# Patient Record
Sex: Male | Born: 1965 | Race: White | Hispanic: No | Smoking: Former smoker
Health system: Southern US, Community
[De-identification: ages and names within clinical notes are randomized; demographics above are authoritative.]

## PROBLEM LIST (undated history)

## (undated) DIAGNOSIS — G8929 Other chronic pain: Secondary | ICD-10-CM

## (undated) DIAGNOSIS — Z85528 Personal history of other malignant neoplasm of kidney: Secondary | ICD-10-CM

## (undated) DIAGNOSIS — K219 Gastro-esophageal reflux disease without esophagitis: Secondary | ICD-10-CM

## (undated) DIAGNOSIS — N189 Chronic kidney disease, unspecified: Secondary | ICD-10-CM

## (undated) DIAGNOSIS — Z95810 Presence of automatic (implantable) cardiac defibrillator: Secondary | ICD-10-CM

## (undated) DIAGNOSIS — M1990 Unspecified osteoarthritis, unspecified site: Secondary | ICD-10-CM

## (undated) DIAGNOSIS — I219 Acute myocardial infarction, unspecified: Secondary | ICD-10-CM

## (undated) DIAGNOSIS — G894 Chronic pain syndrome: Secondary | ICD-10-CM

## (undated) DIAGNOSIS — M545 Low back pain, unspecified: Secondary | ICD-10-CM

## (undated) DIAGNOSIS — F32A Depression, unspecified: Secondary | ICD-10-CM

## (undated) DIAGNOSIS — I959 Hypotension, unspecified: Secondary | ICD-10-CM

## (undated) DIAGNOSIS — I5022 Chronic systolic (congestive) heart failure: Secondary | ICD-10-CM

## (undated) DIAGNOSIS — I509 Heart failure, unspecified: Secondary | ICD-10-CM

## (undated) DIAGNOSIS — F419 Anxiety disorder, unspecified: Secondary | ICD-10-CM

## (undated) DIAGNOSIS — E119 Type 2 diabetes mellitus without complications: Secondary | ICD-10-CM

## (undated) DIAGNOSIS — I429 Cardiomyopathy, unspecified: Secondary | ICD-10-CM

## (undated) DIAGNOSIS — E1140 Type 2 diabetes mellitus with diabetic neuropathy, unspecified: Secondary | ICD-10-CM

## (undated) DIAGNOSIS — I10 Essential (primary) hypertension: Secondary | ICD-10-CM

## (undated) HISTORY — PX: BACK SURGERY: SHX140

## (undated) HISTORY — PX: WISDOM TOOTH EXTRACTION: SHX21

## (undated) HISTORY — PX: CARDIAC DEFIBRILLATOR PLACEMENT: SHX171

## (undated) HISTORY — DX: Heart failure, unspecified: I50.9

## (undated) HISTORY — DX: Chronic pain syndrome: G89.4

## (undated) HISTORY — DX: Hypotension, unspecified: I95.9

## (undated) HISTORY — DX: Type 2 diabetes mellitus with diabetic neuropathy, unspecified: E11.40

## (undated) HISTORY — DX: Low back pain, unspecified: M54.50

## (undated) HISTORY — DX: Depression, unspecified: F32.A

## (undated) HISTORY — DX: Presence of automatic (implantable) cardiac defibrillator: Z95.810

## (undated) HISTORY — DX: Chronic systolic (congestive) heart failure: I50.22

## (undated) HISTORY — DX: Anxiety disorder, unspecified: F41.9

## (undated) HISTORY — PX: KIDNEY CYST REMOVAL: SHX684

## (undated) HISTORY — DX: Chronic kidney disease, unspecified: N18.9

## (undated) HISTORY — DX: Type 2 diabetes mellitus without complications: E11.9

## (undated) HISTORY — DX: Essential (primary) hypertension: I10

## (undated) HISTORY — DX: Personal history of other malignant neoplasm of kidney: Z85.528

## (undated) HISTORY — DX: Cardiomyopathy, unspecified: I42.9

## (undated) HISTORY — DX: Other chronic pain: G89.29

---

## 2005-10-22 ENCOUNTER — Encounter: Admission: RE | Admit: 2005-10-22 | Discharge: 2005-10-22 | Payer: Self-pay | Admitting: Neurosurgery

## 2005-10-22 IMAGING — RF DG MYELOGRAM LUMBAR
14 of 17 series · 14 of 17 positions shown · IV contrast (omnipaque)
Comparison: none

CLINICAL DATA: Degenerative disc disease with low back pain and leg pain.  
 LUMBAR MYELOGRAM:
 Following informed consent, sterile preparation of the back, and adequate local anesthesia, a lumbar puncture was performed using a 22 gauge needle at L3-4 from a right midline approach.  Fluid was clear and colorless.  10cc of Omnipaque 180 was instilled in the subarachnoid space.  
 There is no abnormality at L3-4 or above. 
 At L4-5, there is a moderate anterior extradural defect.  There is narrowing of the lateral recesses that could affect either L5 nerve root.  
 At L5-S1, there is an anterior extradural defect but no evidence of S1 nerve root compression.
TECHNIQUE: Multidetector CT imaging of the lumbar spine was performed after intrathecal injection of contrast.  Multiplanar CT image reconstructions were also generated.

[Series 1: (hospital) · 1 of 1 slices shown]
[im 1/1]
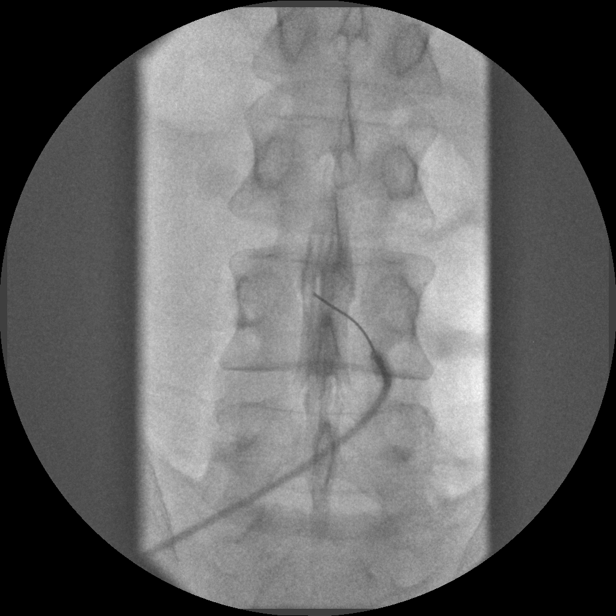

[Series 2: myelogram  white · 1 of 1 slices shown (1 of 13)]
[im 1/1]
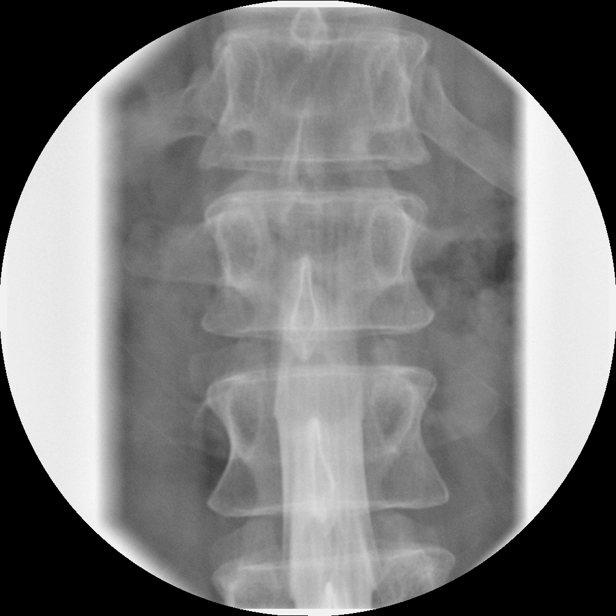

[Series 4: myelogram  white · 1 of 1 slices shown (2 of 13)]
[im 1/1]
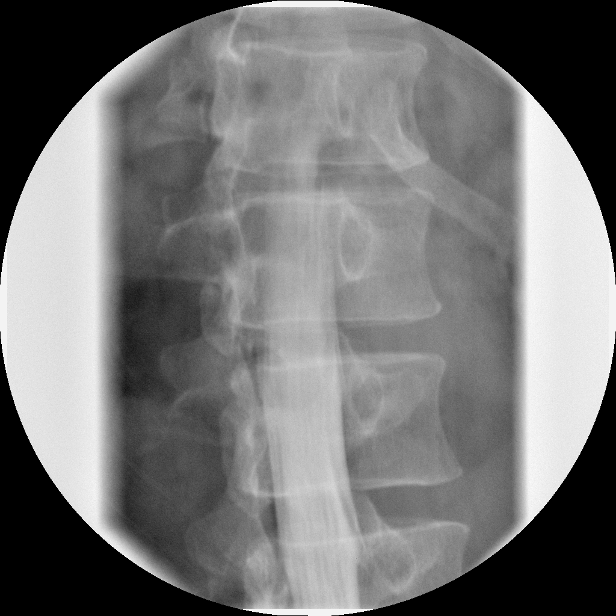

[Series 5: myelogram  white · 1 of 1 slices shown (3 of 13)]
[im 1/1]
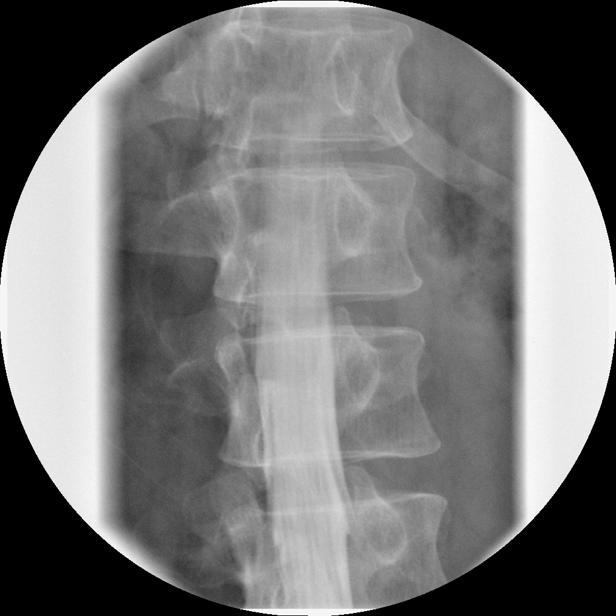

[Series 6: myelogram  white · 1 of 1 slices shown (4 of 13)]
[im 1/1]
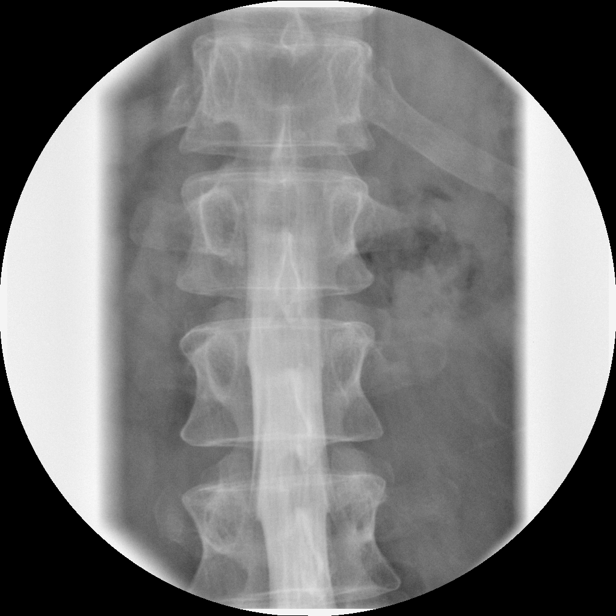

[Series 7: myelogram  white · 1 of 1 slices shown (5 of 13)]
[im 1/1]
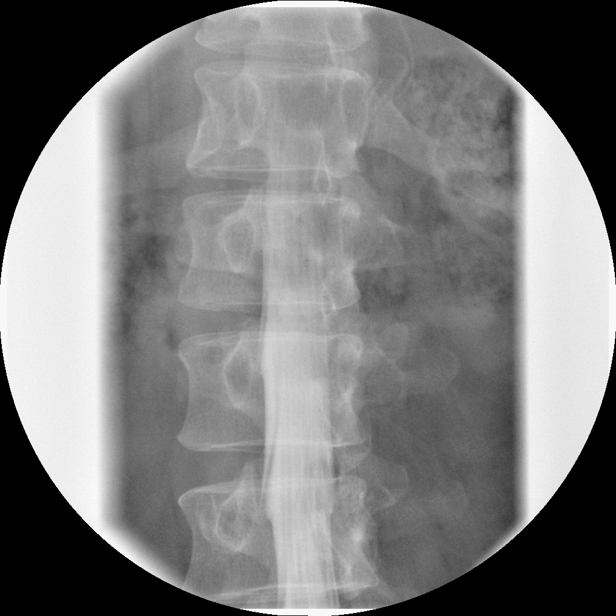

[Series 8: myelogram  white · 1 of 1 slices shown (6 of 13)]
[im 1/1]
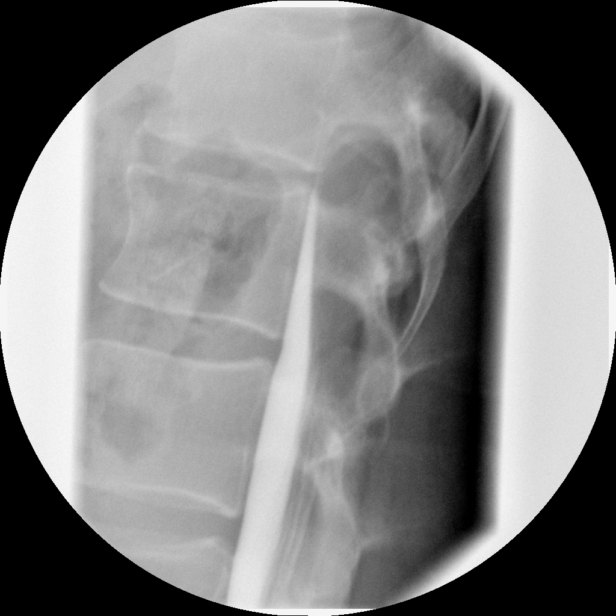

[Series 10: myelogram  white · 1 of 1 slices shown (7 of 13)]
[im 1/1]
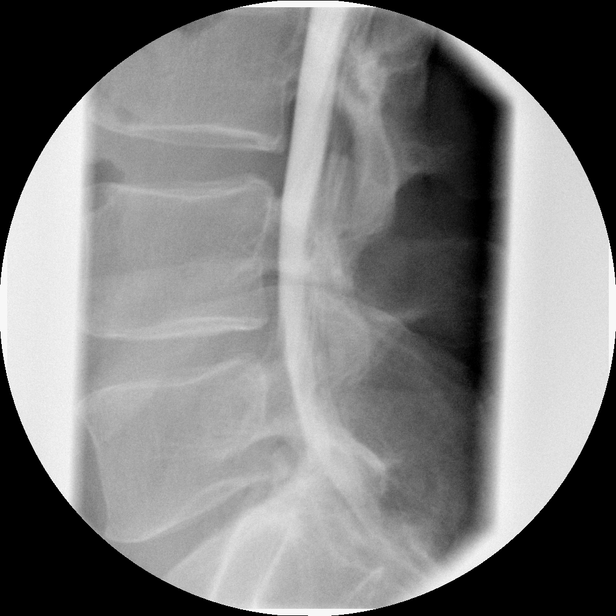

[Series 11: myelogram  white · 1 of 1 slices shown (8 of 13)]
[im 1/1]
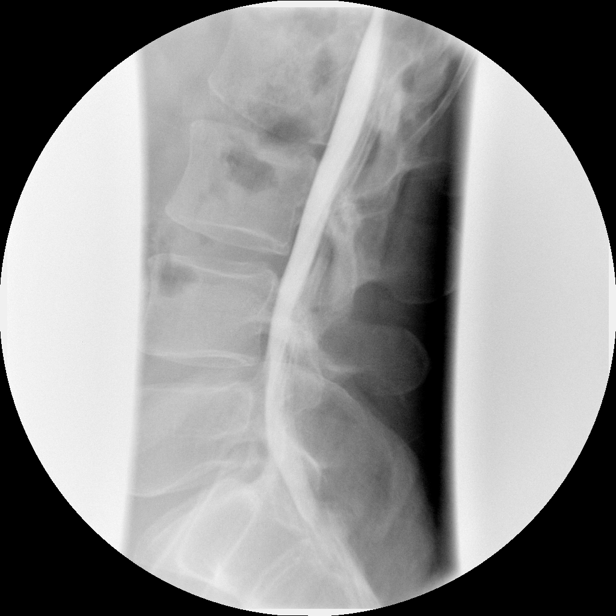

[Series 12: myelogram  white · 1 of 1 slices shown (9 of 13)]
[im 1/1]
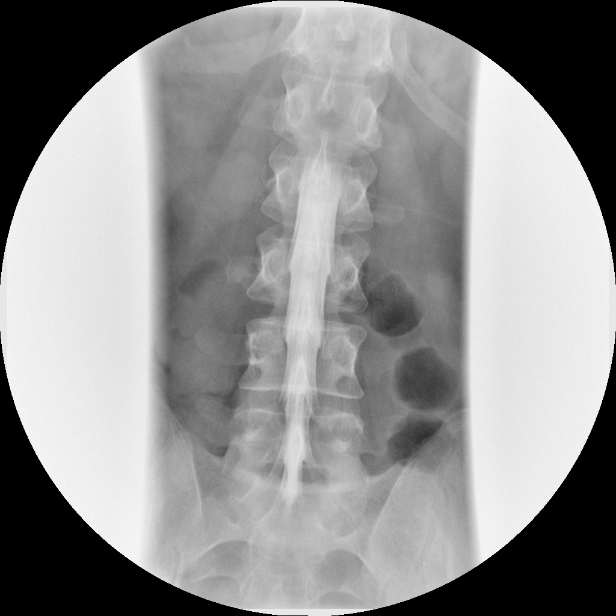

[Series 13: myelogram  white · 1 of 1 slices shown (10 of 13)]
[im 1/1]
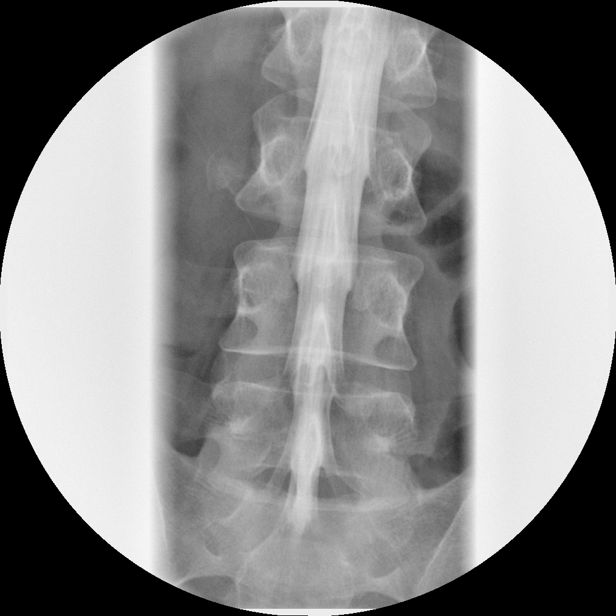

[Series 14: myelogram  white · 1 of 1 slices shown (11 of 13)]
[im 1/1]
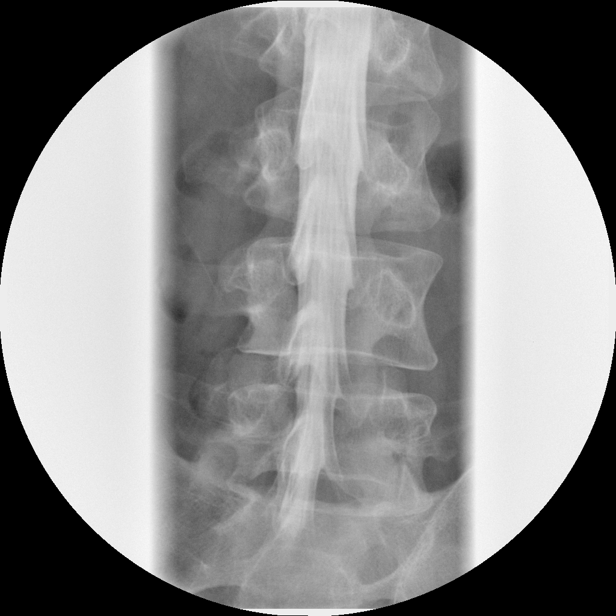

[Series 16: myelogram  white · 1 of 1 slices shown (12 of 13)]
[im 1/1]
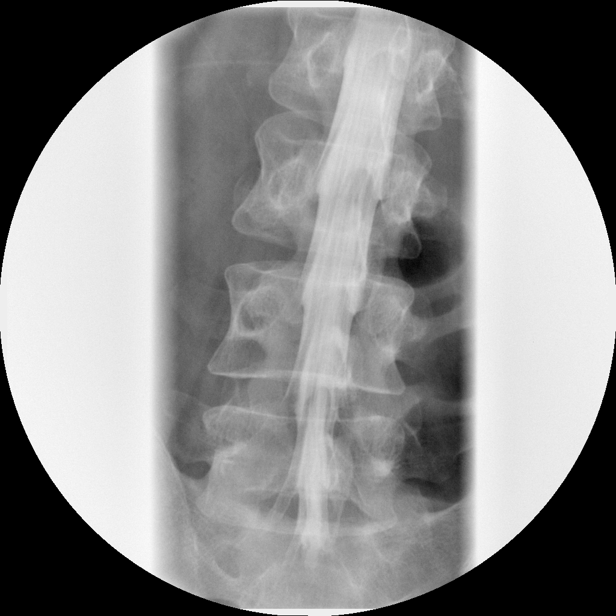

[Series 17: myelogram  white · 1 of 1 slices shown (13 of 13)]
[im 1/1]
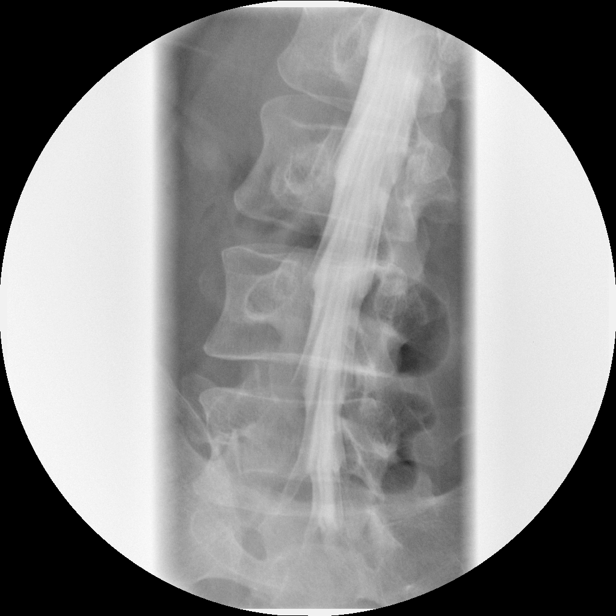

[14 of 17 positions shown; findings below may reference images not displayed]

IMPRESSION: Anterior extradural defects at L4-5 and L5-S1 with apparent L5 nerve root compression in both lateral recesses. 
 CT LUMBAR SPINE W/CONTRAST (POST-MYELOGRAM):
FINDINGS: L1-2 and L2-3, normal. 
 L3-4, minimal disc bulge but no herniation or stenosis. 
 L4-5, circumferential protrusion of disc material.  Mild ligamentous hypertrophy.  Stenosis at both lateral recesses that could compress either L5 nerve root. 
 L5-S1, shallow broad based disc protrusion that approaches the S1 nerve roots but does not compress or displace them. 
 No osseous or articular pathology.
IMPRESSION: 1.  Broad based disc herniation at L4-5.  Ligamentous hypertrophy.  Stenosis at both lateral recesses that could well compress either or both L5 nerve roots. 
 2.  Central disc protrusion at L5-S1 that approaches the S1 nerve roots but does not grossly compress or displace them.

## 2005-10-22 IMAGING — CT CT L SPINE W/ CM
3 of 8 series · 15 of 33 positions shown, 18 images · IV contrast (omnipaque)
Comparison: none

CLINICAL DATA: Degenerative disc disease with low back pain and leg pain.  
 LUMBAR MYELOGRAM:
 Following informed consent, sterile preparation of the back, and adequate local anesthesia, a lumbar puncture was performed using a 22 gauge needle at L3-4 from a right midline approach.  Fluid was clear and colorless.  10cc of Omnipaque 180 was instilled in the subarachnoid space.  
 There is no abnormality at L3-4 or above. 
 At L4-5, there is a moderate anterior extradural defect.  There is narrowing of the lateral recesses that could affect either L5 nerve root.  
 At L5-S1, there is an anterior extradural defect but no evidence of S1 nerve root compression.
TECHNIQUE: Multidetector CT imaging of the lumbar spine was performed after intrathecal injection of contrast.  Multiplanar CT image reconstructions were also generated.

[Series 4: recon 3: l-spine helical · axial · 0.27mm/px · z∈[-66,+83]mm · 7 of 159 slices shown, 9 images]
[im 20/159  soft-tissue]
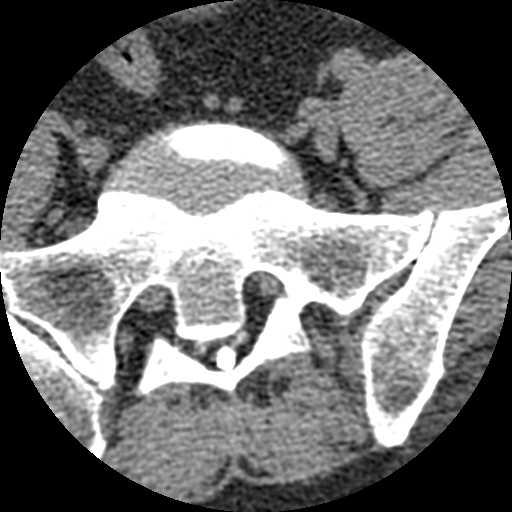
[im 20/159  bone]
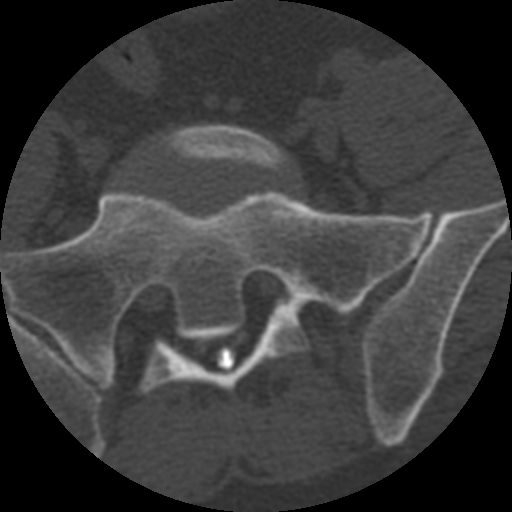
[im 40/159  bone]
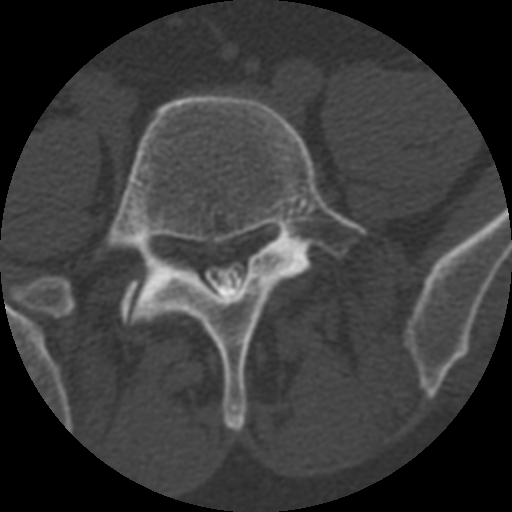
[im 60/159  bone]
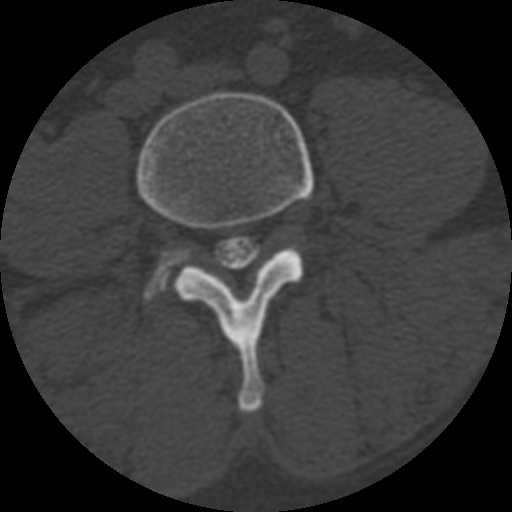
[im 80/159  bone]
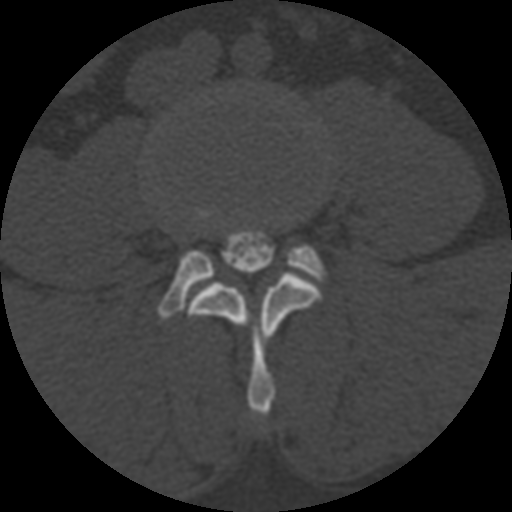
[im 99/159  soft-tissue]
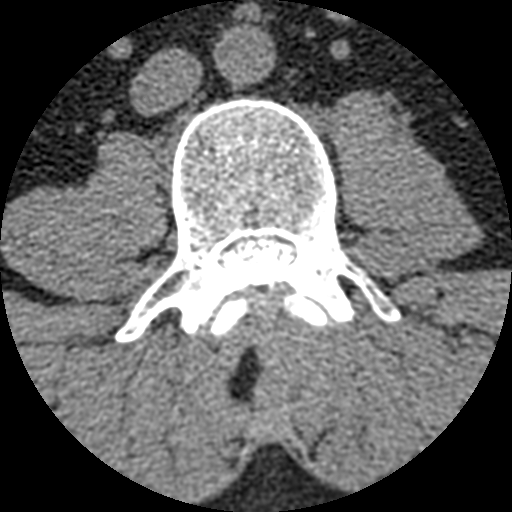
[im 99/159  bone]
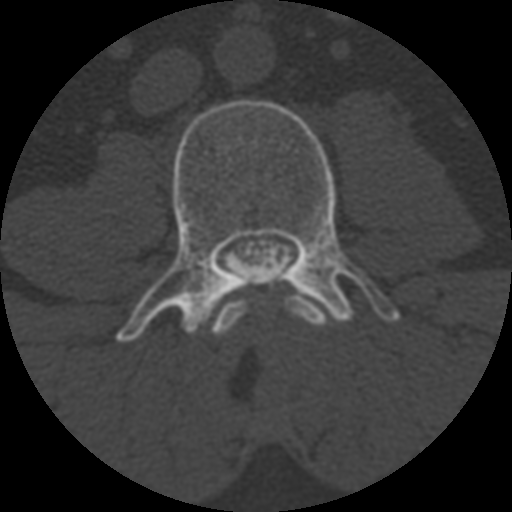
[im 119/159  bone]
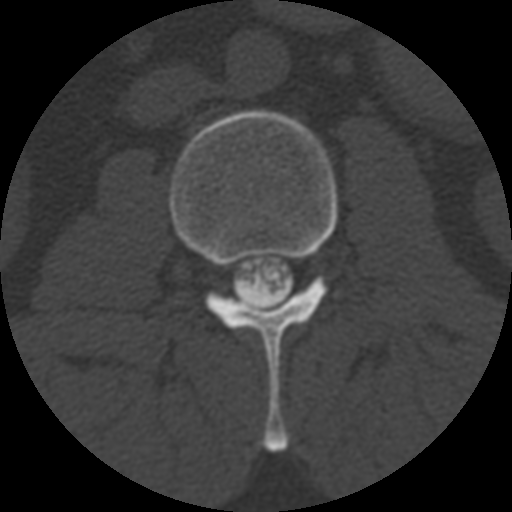
[im 139/159  bone]
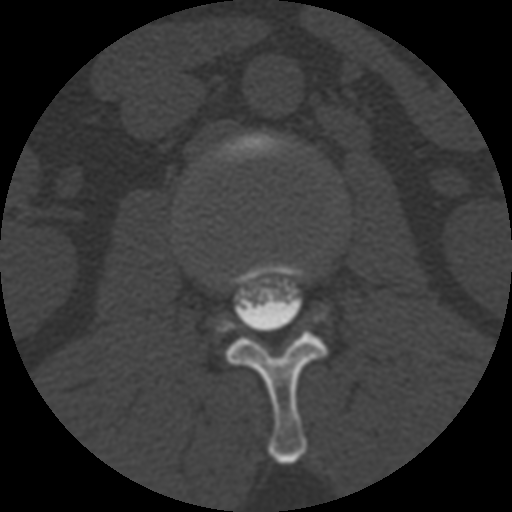

[Series 400: reformatted · sagittal · 0.40mm/px · 5 of 40 slices shown, 6 images (1 of 2)]
[im 14/40  bone]
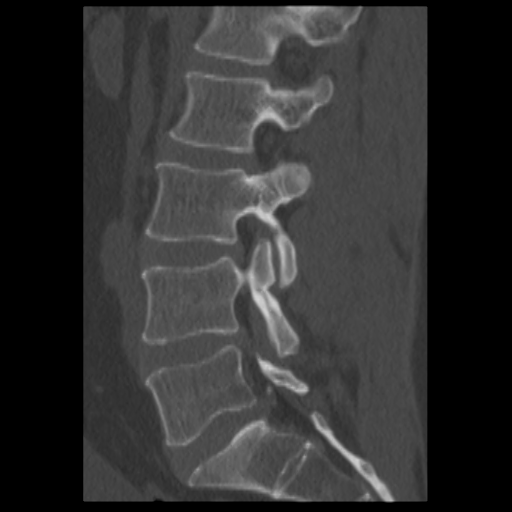
[im 17/40  bone]
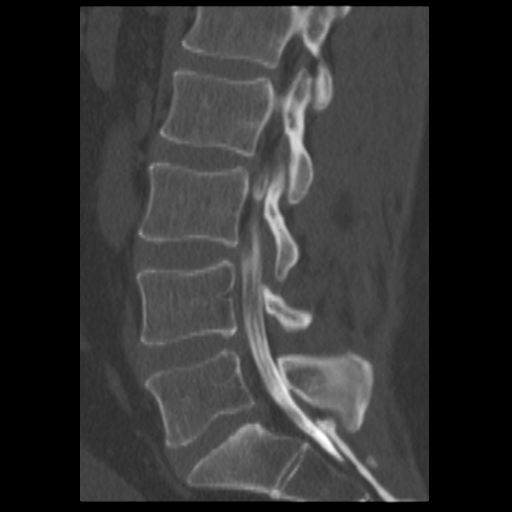
[im 20/40  soft-tissue]
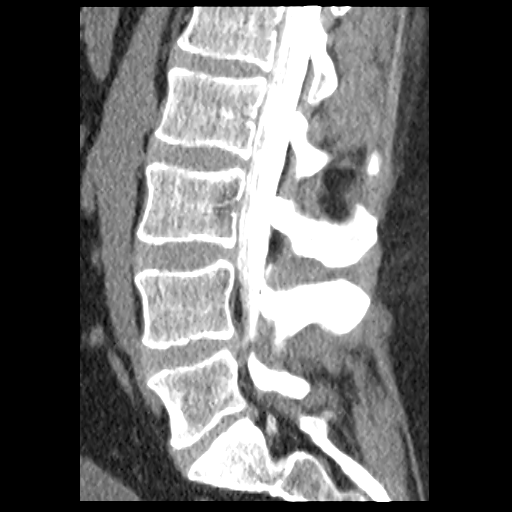
[im 20/40  bone]
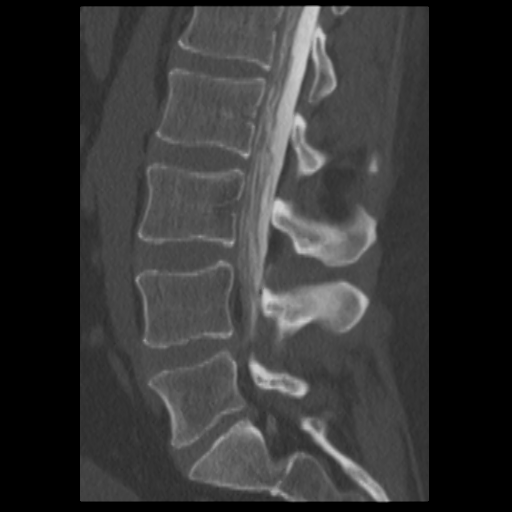
[im 23/40  bone]
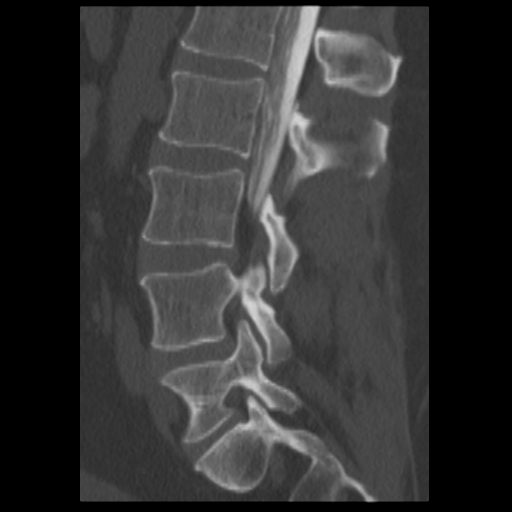
[im 27/40  bone]
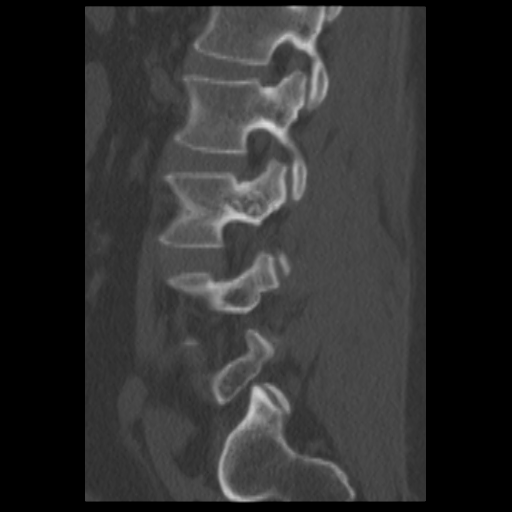

[Series 401: reformatted · coronal · 0.40mm/px · 3 of 40 slices shown (2 of 2)]
[im 8/40  bone]
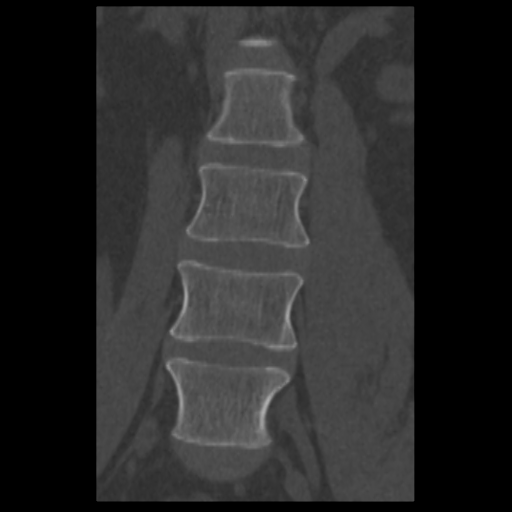
[im 16/40  bone]
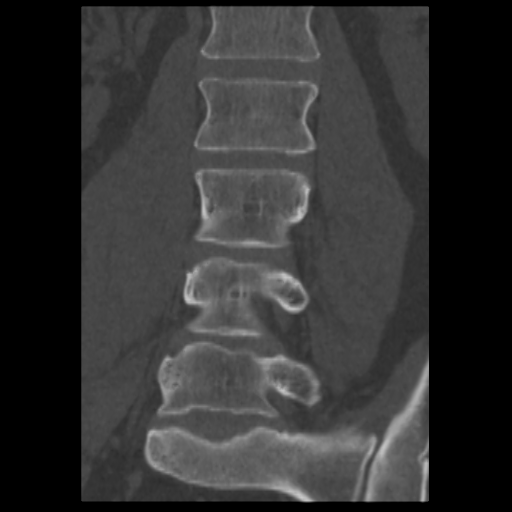
[im 24/40  bone]
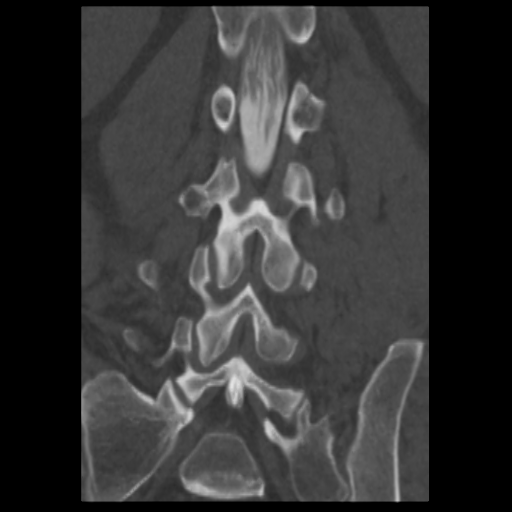

[15 of 33 positions shown; findings below may reference images not displayed]

IMPRESSION: Anterior extradural defects at L4-5 and L5-S1 with apparent L5 nerve root compression in both lateral recesses. 
 CT LUMBAR SPINE W/CONTRAST (POST-MYELOGRAM):
FINDINGS: L1-2 and L2-3, normal. 
 L3-4, minimal disc bulge but no herniation or stenosis. 
 L4-5, circumferential protrusion of disc material.  Mild ligamentous hypertrophy.  Stenosis at both lateral recesses that could compress either L5 nerve root. 
 L5-S1, shallow broad based disc protrusion that approaches the S1 nerve roots but does not compress or displace them. 
 No osseous or articular pathology.
IMPRESSION: 1.  Broad based disc herniation at L4-5.  Ligamentous hypertrophy.  Stenosis at both lateral recesses that could well compress either or both L5 nerve roots. 
 2.  Central disc protrusion at L5-S1 that approaches the S1 nerve roots but does not grossly compress or displace them.

## 2005-12-09 IMAGING — CR DG CHEST 2V
2 series · 2 of 2 positions shown · non-contrast
Comparison: none

CLINICAL DATA: HNP.  Diabetes.  Hypertension.  Preoperative chest. 
 CHEST ? 2 VIEW ? 12/09/05:

[view not recorded (1 of 2)]
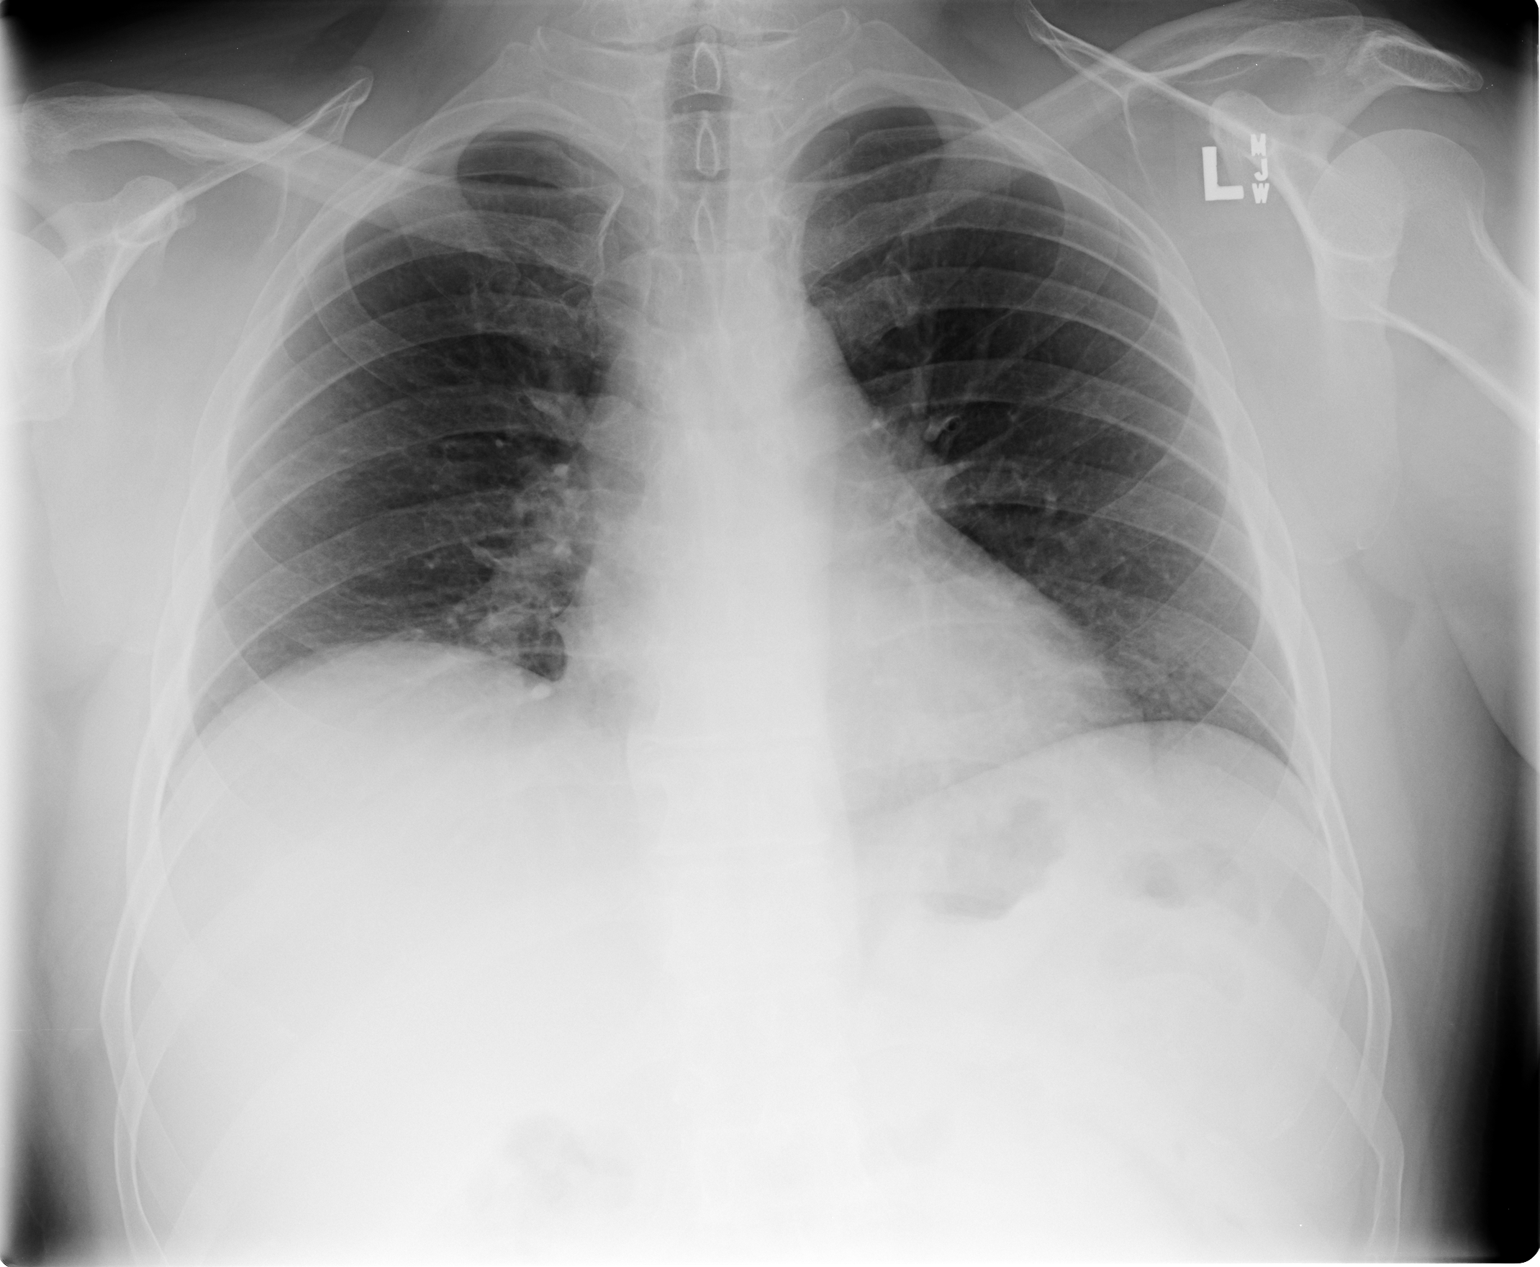

[view not recorded (2 of 2)]
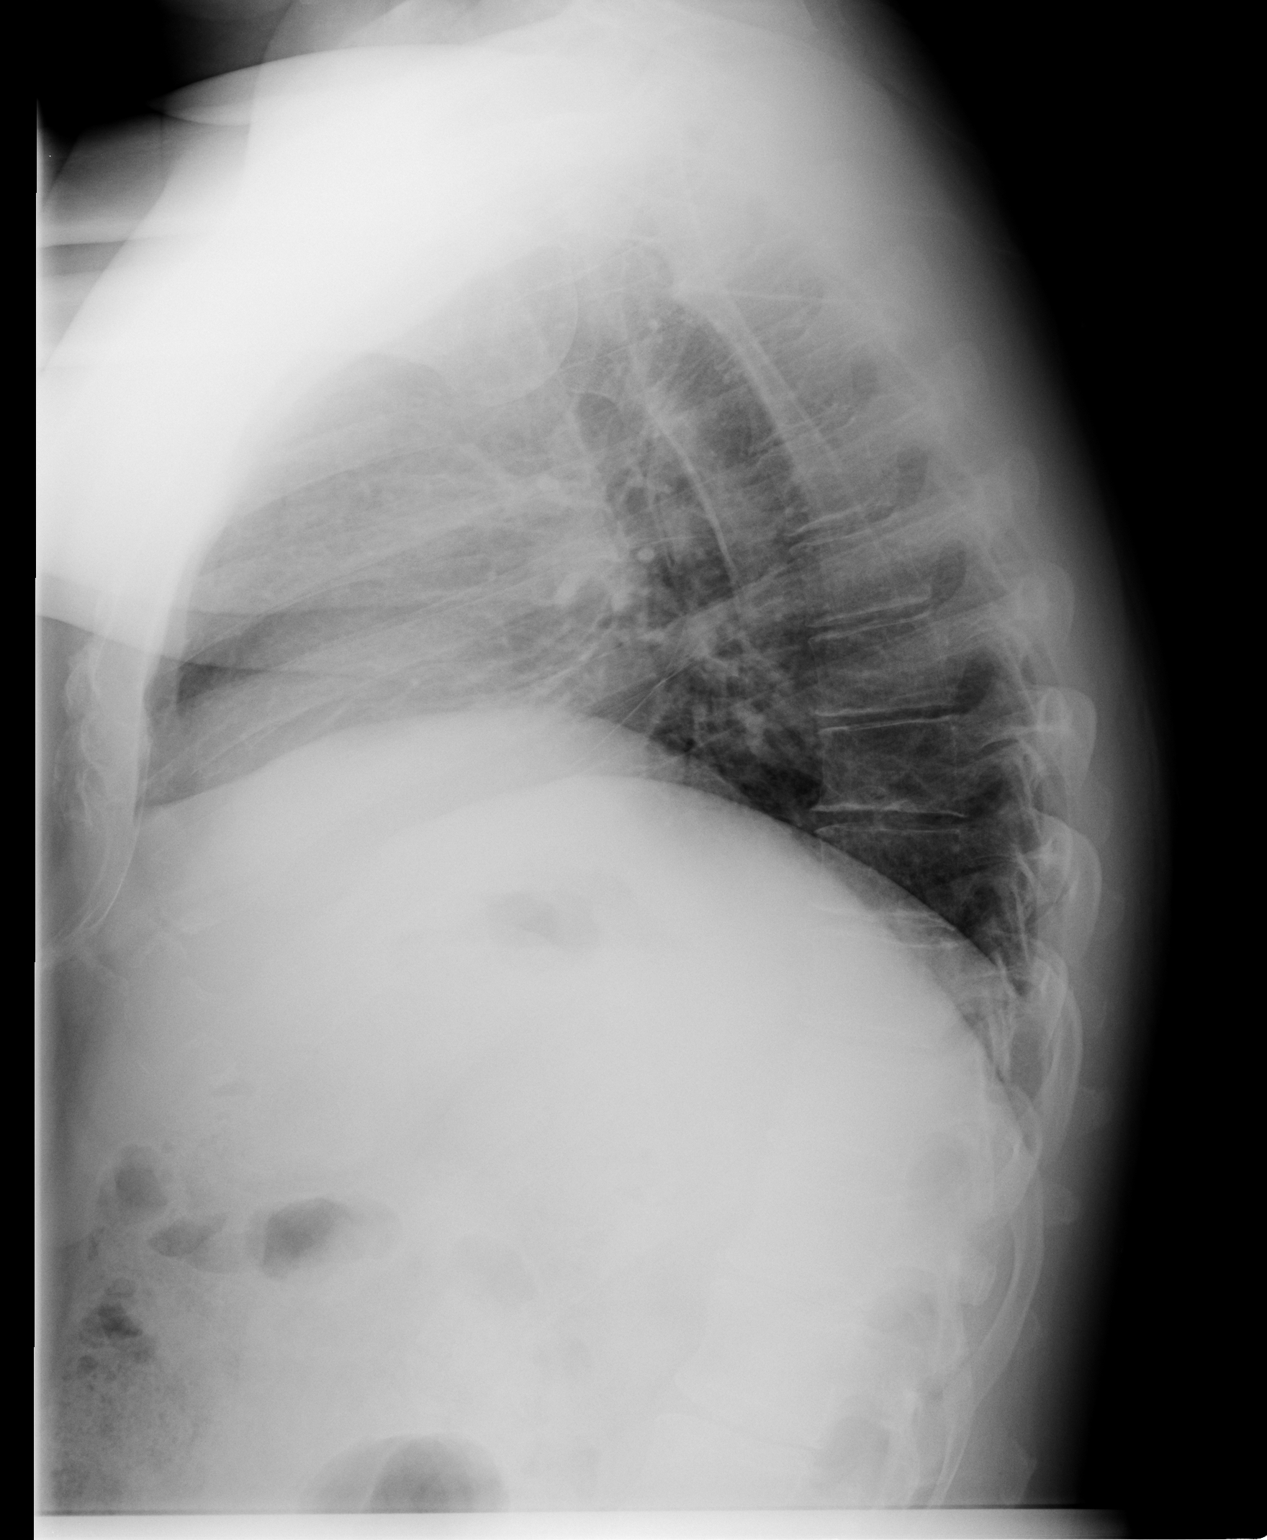

[2 of 2 positions shown; findings below may reference images not displayed]

FINDINGS: The lungs are clear, and the heart and mediastinal structures are normal.
IMPRESSION: No evidence for active chest disease.

## 2005-12-15 ENCOUNTER — Inpatient Hospital Stay (HOSPITAL_COMMUNITY): Admission: RE | Admit: 2005-12-15 | Discharge: 2005-12-17 | Payer: Self-pay | Admitting: Neurosurgery

## 2005-12-15 IMAGING — RF DG LUMBAR SPINE 2-3V
1 series · 2 of 2 positions shown · non-contrast
Comparison: none

CLINICAL DATA: Lumbar laminectomy and fusion for spinal stenosis and disk herniation.
 LUMBAR SPINE ? 2 VIEW:

[Series 1: run · 2 of 2 slices shown]
[im 1/2]
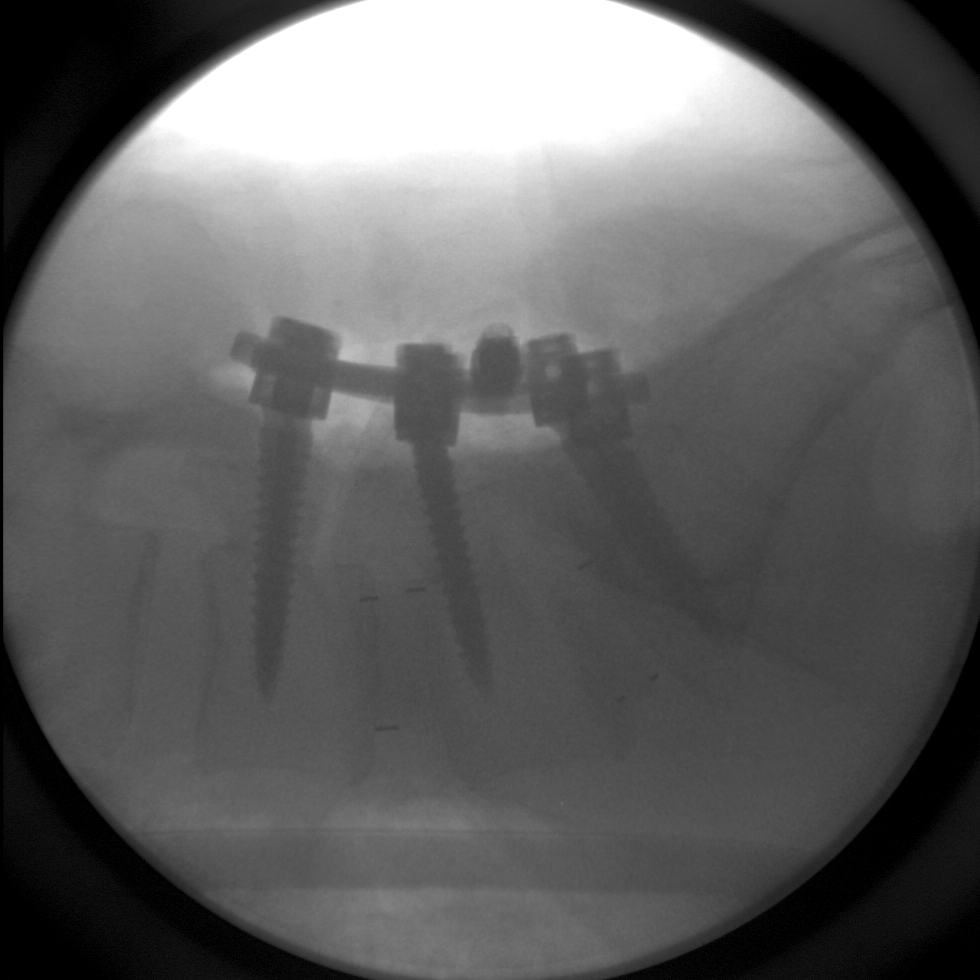
[im 2/2]
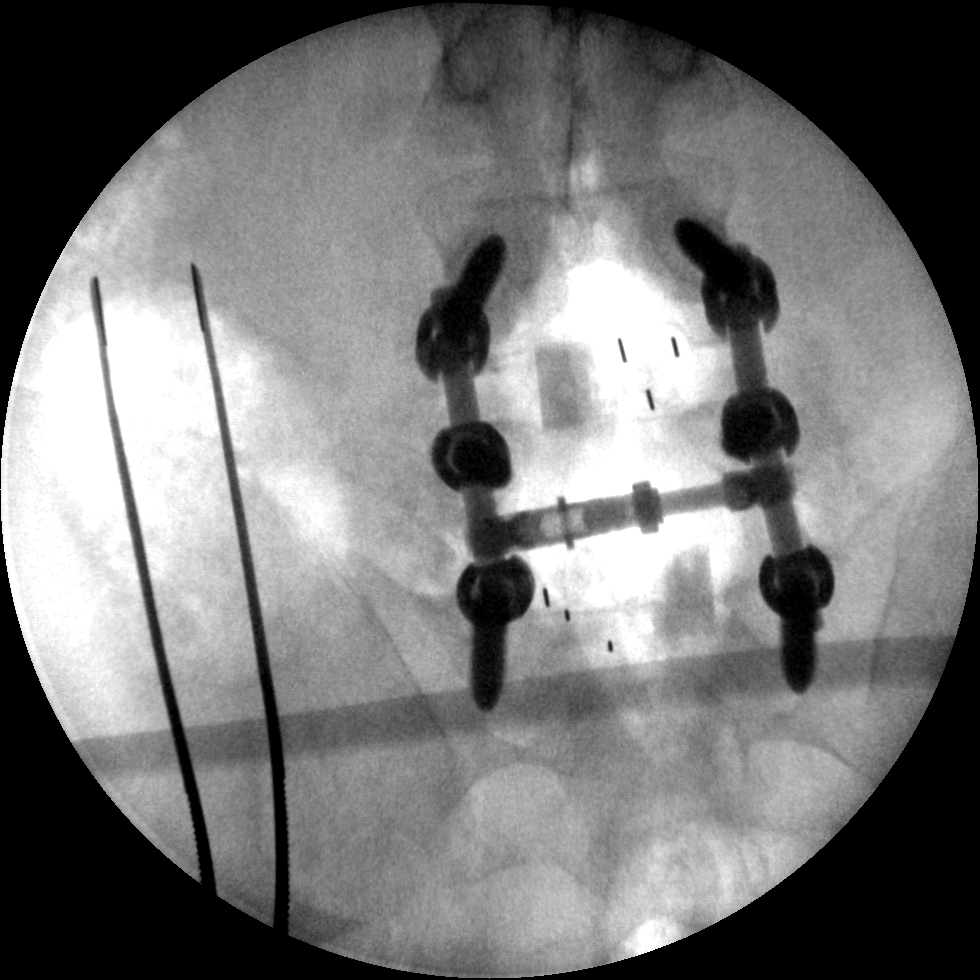

[2 of 2 positions shown; findings below may reference images not displayed]

FINDINGS: Frontal and lateral projections were obtained intraoperatively with the C-arm.  Posterior interpedicular screws are present at L-4, L-5 and S-1 with intervening bony allograft material.  Alignment appears near anatomic.
IMPRESSION: Posterior lumbar fusion spanning from L-4 to S-1.

## 2006-04-14 ENCOUNTER — Encounter: Admission: RE | Admit: 2006-04-14 | Discharge: 2006-04-14 | Payer: Self-pay | Admitting: Neurosurgery

## 2006-04-14 IMAGING — CR DG MYELOGRAM LUMBAR
3 series · 3 of 3 positions shown · IV contrast (omnipaque)
Comparison: none

CLINICAL DATA: Low back pain radiating to both lower extremities.  Recent L4-5 and S1 fusion. 
 LUMBAR MYELOGRAM:
TECHNIQUE: The low back was prepped and draped in a sterile fashion.  Lidocaine was utilized for local anesthesia.  Under fluoroscopic guidance, a 22 gauge spinal needle was inserted into the CSF space at L3-4 via left paramedian approach.  20cc Omnipaque 300 was administered.  No complications were encountered.
TECHNIQUE: Multidetector CT imaging of the lumbar spine was performed after intrathecal injection of contrast.  Multiplanar CT image reconstructions were also generated.

[view not recorded (1 of 3)]
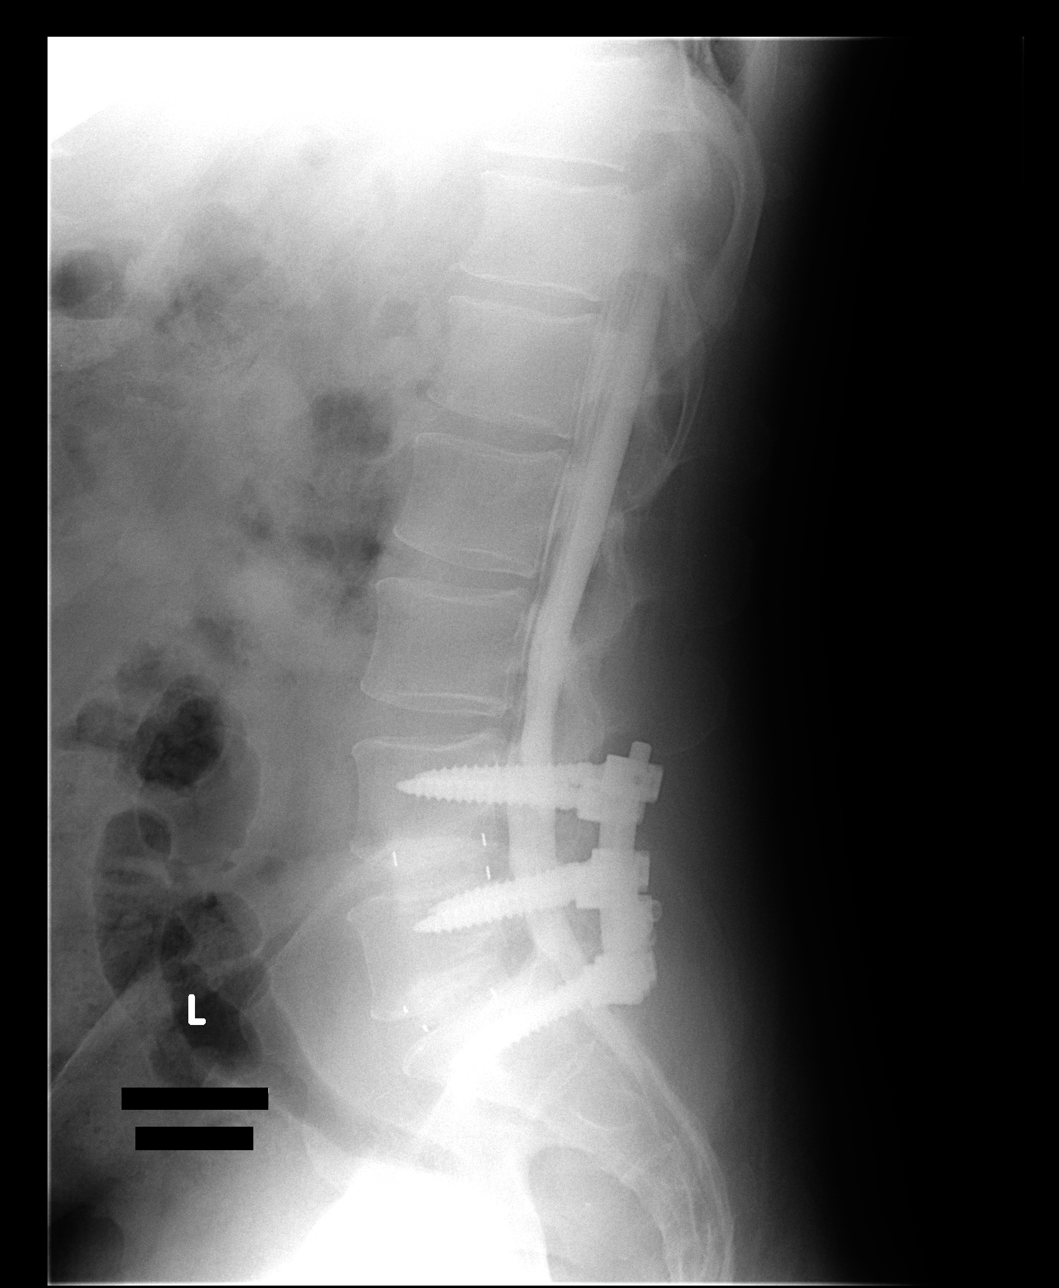

[view not recorded (2 of 3)]
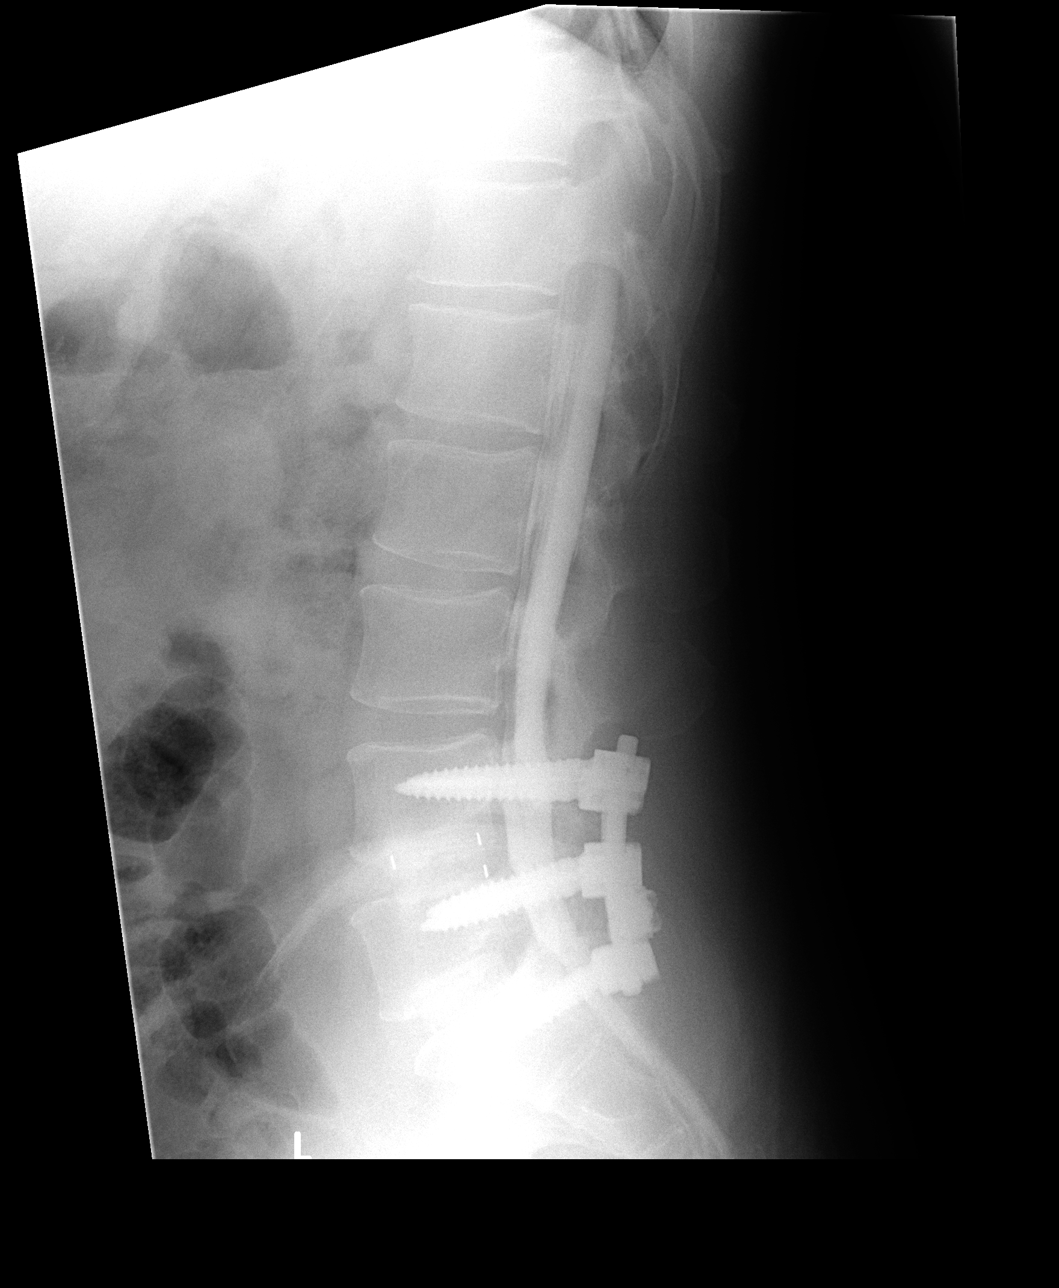

[view not recorded (3 of 3)]
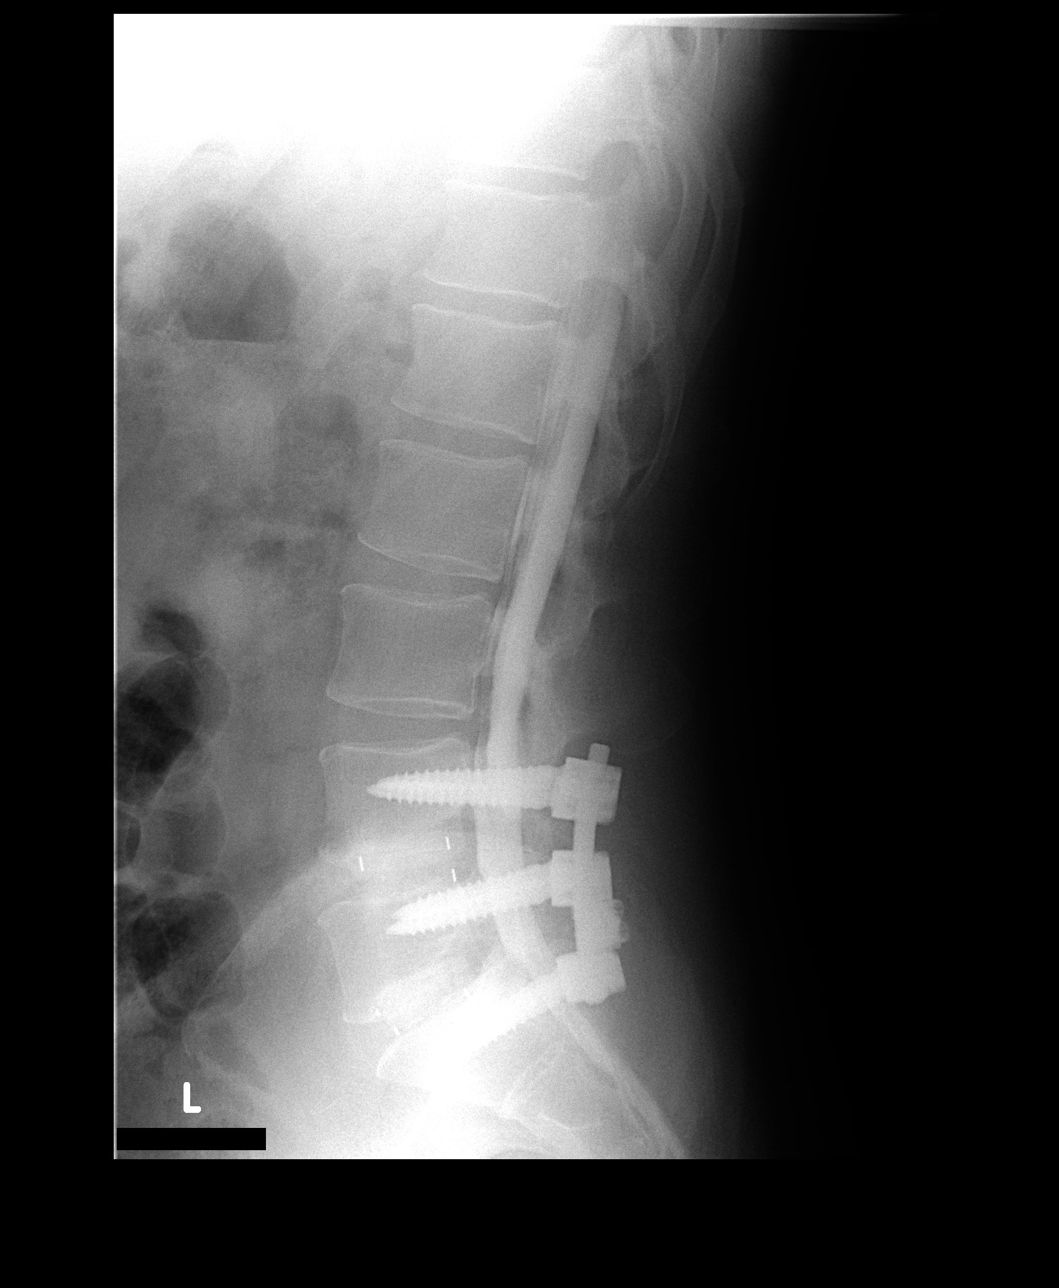

[3 of 3 positions shown; findings below may reference images not displayed]

FINDINGS: Bilateral pedicle screws and cross stabilizing bars are seen in L4, L5, and S1.  There is narrowing of the spinal canal at the L5-S1 level of unknown significance.  L4 and above there is no significant spinal stenosis.  Nerve root sleeves are not effaced.  At L5-S1, there is no significant effacement of the S1 nerve root sleeves. 
 Flexion and extension views were performed.  The patient provided little if any range of motion through these three images.  No evidence of instability.
IMPRESSION: Post operative changes for L4 through S1 fusion.  Some narrowing of the spinal canal at L5-S1.  CT to follow. 
 CT LUMBAR SPINE W/CONTRAST (POST-MYELOGRAM):
FINDINGS: Bilateral pedicle screws are present in L4, L5, and S1.  They are appropriately positioned.  Cross stabilizing bars are present.  Bone graft material is present in the L4-5 and L5-S1 discs.  Solid fusion has not yet occurred through the disc nor has it occurred across the posterior elements.  The conus medullaris terminates at L1.  There is no vertebral body height loss.  
 L1-2:  Unremarkable.  
 L2-3:  Unremarkable.  
 L3-4:  Mild concentric bulge.  No central lateral recess or foraminal narrowing.  
 L4-5:  Posterior laminotomy is noted.  Soft tissue density surrounds the thecal sac likely epidural fibrosis.  The L5 nerve root sleeves do fill with contrast.  The previously noted broad base posterior disc protrusion at this level is no longer present.  Foramina are grossly patent.  
 L5-S1:  Posterior laminotomy is noted.  Soft tissue density surrounds the thecal sac once again likely epidural fibrosis.  The S1 nerve root sleeves do fill with contrast without significant effacement.  S2 nerve root sleeves also fill with contrast.  Some narrowing of the spinal canal at L5-S1 is present and is unchanged and was most likely due to epidural lipomatosis.  There is mild right foraminal narrowing due to disc height loss.  There is some soft tissue material within the inferior left L5-S1 foramen causing foraminal narrowing.  Considerations would include epidural fibrosis or possibly residual disc material.
IMPRESSION: 1.  Posterior fusion and posterior decompression at L4-5 and L5-S1 as described.  
 2.  Biforaminal narrowing at L5-S1 as described. 
 3.  The bony fusion has not yet developed solid callus.

## 2006-04-14 IMAGING — CT CT L SPINE W/ CM
3 of 8 series · 6 of 20 positions shown, 7 images · IV contrast (omnipaque)
Comparison: none

CLINICAL DATA: Low back pain radiating to both lower extremities.  Recent L4-5 and S1 fusion. 
 LUMBAR MYELOGRAM:
TECHNIQUE: The low back was prepped and draped in a sterile fashion.  Lidocaine was utilized for local anesthesia.  Under fluoroscopic guidance, a 22 gauge spinal needle was inserted into the CSF space at L3-4 via left paramedian approach.  20cc Omnipaque 300 was administered.  No complications were encountered.
TECHNIQUE: Multidetector CT imaging of the lumbar spine was performed after intrathecal injection of contrast.  Multiplanar CT image reconstructions were also generated.

[Series 2: l spine · axial · 0.27mm/px · z∈[-296,-219]mm · 2 of 93 slices shown]
[im 31/93  bone]
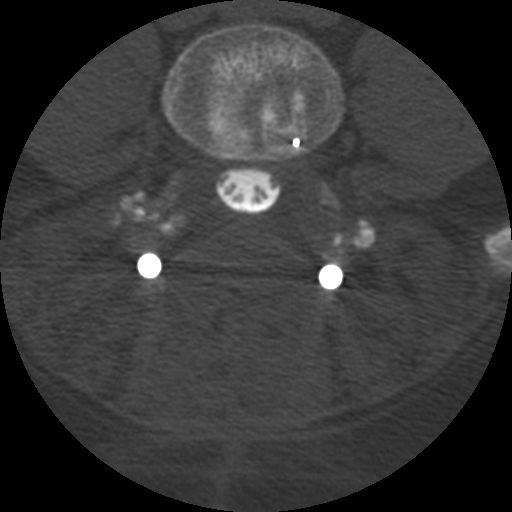
[im 62/93  bone]
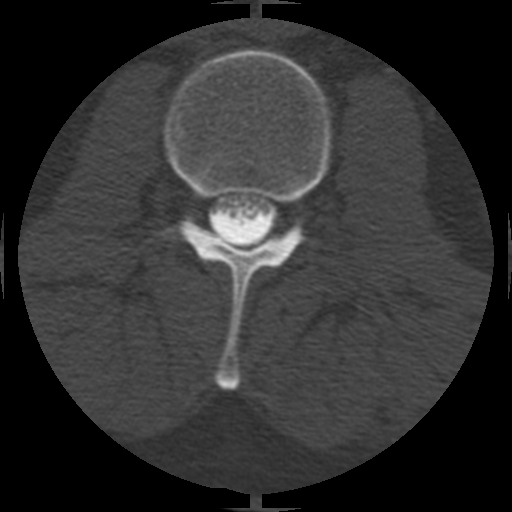

[Series 3: bone windows · axial · 0.27mm/px · z∈[-314,-199]mm · 3 of 92 slices shown, 4 images]
[im 23/92  soft-tissue]
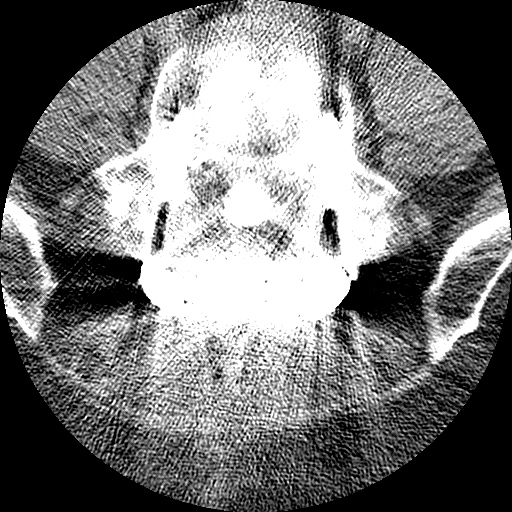
[im 23/92  bone]
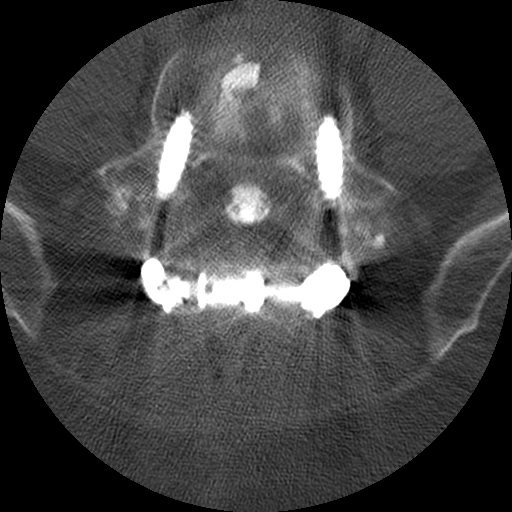
[im 46/92  bone]
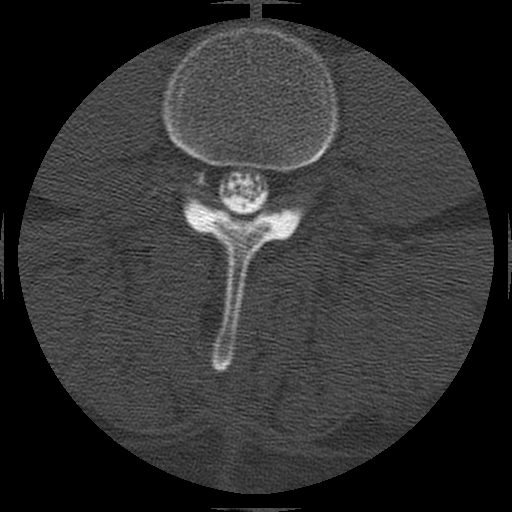
[im 69/92  bone]
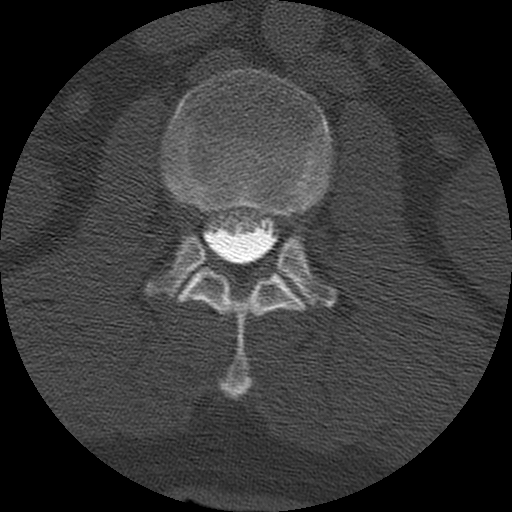

[Series 400: coronal lumbar · coronal · 0.46mm/px · 1 of 40 slices shown]
[im 20/40  bone]
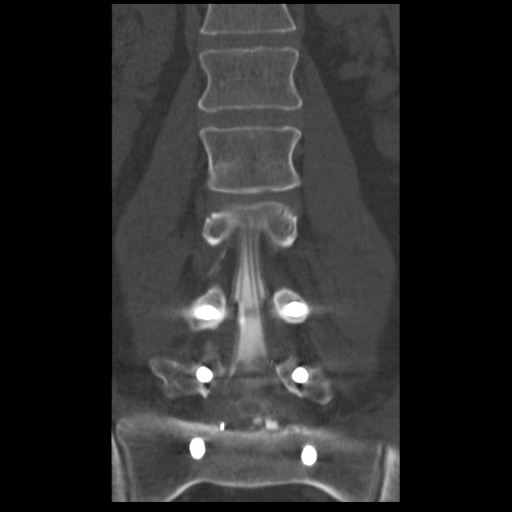

[6 of 20 positions shown; findings below may reference images not displayed]

FINDINGS: Bilateral pedicle screws and cross stabilizing bars are seen in L4, L5, and S1.  There is narrowing of the spinal canal at the L5-S1 level of unknown significance.  L4 and above there is no significant spinal stenosis.  Nerve root sleeves are not effaced.  At L5-S1, there is no significant effacement of the S1 nerve root sleeves. 
 Flexion and extension views were performed.  The patient provided little if any range of motion through these three images.  No evidence of instability.
IMPRESSION: Post operative changes for L4 through S1 fusion.  Some narrowing of the spinal canal at L5-S1.  CT to follow. 
 CT LUMBAR SPINE W/CONTRAST (POST-MYELOGRAM):
FINDINGS: Bilateral pedicle screws are present in L4, L5, and S1.  They are appropriately positioned.  Cross stabilizing bars are present.  Bone graft material is present in the L4-5 and L5-S1 discs.  Solid fusion has not yet occurred through the disc nor has it occurred across the posterior elements.  The conus medullaris terminates at L1.  There is no vertebral body height loss.  
 L1-2:  Unremarkable.  
 L2-3:  Unremarkable.  
 L3-4:  Mild concentric bulge.  No central lateral recess or foraminal narrowing.  
 L4-5:  Posterior laminotomy is noted.  Soft tissue density surrounds the thecal sac likely epidural fibrosis.  The L5 nerve root sleeves do fill with contrast.  The previously noted broad base posterior disc protrusion at this level is no longer present.  Foramina are grossly patent.  
 L5-S1:  Posterior laminotomy is noted.  Soft tissue density surrounds the thecal sac once again likely epidural fibrosis.  The S1 nerve root sleeves do fill with contrast without significant effacement.  S2 nerve root sleeves also fill with contrast.  Some narrowing of the spinal canal at L5-S1 is present and is unchanged and was most likely due to epidural lipomatosis.  There is mild right foraminal narrowing due to disc height loss.  There is some soft tissue material within the inferior left L5-S1 foramen causing foraminal narrowing.  Considerations would include epidural fibrosis or possibly residual disc material.
IMPRESSION: 1.  Posterior fusion and posterior decompression at L4-5 and L5-S1 as described.  
 2.  Biforaminal narrowing at L5-S1 as described. 
 3.  The bony fusion has not yet developed solid callus.

## 2007-03-22 ENCOUNTER — Ambulatory Visit: Payer: Self-pay | Admitting: Endocrinology

## 2007-03-22 DIAGNOSIS — E1065 Type 1 diabetes mellitus with hyperglycemia: Secondary | ICD-10-CM

## 2007-03-22 DIAGNOSIS — F419 Anxiety disorder, unspecified: Secondary | ICD-10-CM | POA: Insufficient documentation

## 2007-03-22 DIAGNOSIS — L600 Ingrowing nail: Secondary | ICD-10-CM | POA: Insufficient documentation

## 2007-03-22 DIAGNOSIS — E1039 Type 1 diabetes mellitus with other diabetic ophthalmic complication: Secondary | ICD-10-CM | POA: Insufficient documentation

## 2007-03-22 DIAGNOSIS — G619 Inflammatory polyneuropathy, unspecified: Secondary | ICD-10-CM | POA: Insufficient documentation

## 2007-03-22 DIAGNOSIS — F411 Generalized anxiety disorder: Secondary | ICD-10-CM | POA: Insufficient documentation

## 2007-03-22 DIAGNOSIS — G622 Polyneuropathy due to other toxic agents: Secondary | ICD-10-CM

## 2007-04-27 ENCOUNTER — Ambulatory Visit: Payer: Self-pay | Admitting: Endocrinology

## 2007-06-09 ENCOUNTER — Encounter: Payer: Self-pay | Admitting: Endocrinology

## 2008-04-07 IMAGING — RF DG MYELOGRAM LUMBAR
13 of 17 series · 13 of 17 positions shown · IV contrast (omnipaque)
Comparison: none

CLINICAL DATA: Low back pain radiating to both lower extremities.  Recent L4-5 and S1 fusion. 
 LUMBAR MYELOGRAM:
TECHNIQUE: The low back was prepped and draped in a sterile fashion.  Lidocaine was utilized for local anesthesia.  Under fluoroscopic guidance, a 22 gauge spinal needle was inserted into the CSF space at L3-4 via left paramedian approach.  20cc Omnipaque 300 was administered.  No complications were encountered.
TECHNIQUE: Multidetector CT imaging of the lumbar spine was performed after intrathecal injection of contrast.  Multiplanar CT image reconstructions were also generated.

[Series 1: (hospital) · 1 of 1 slices shown]
[im 1/1]
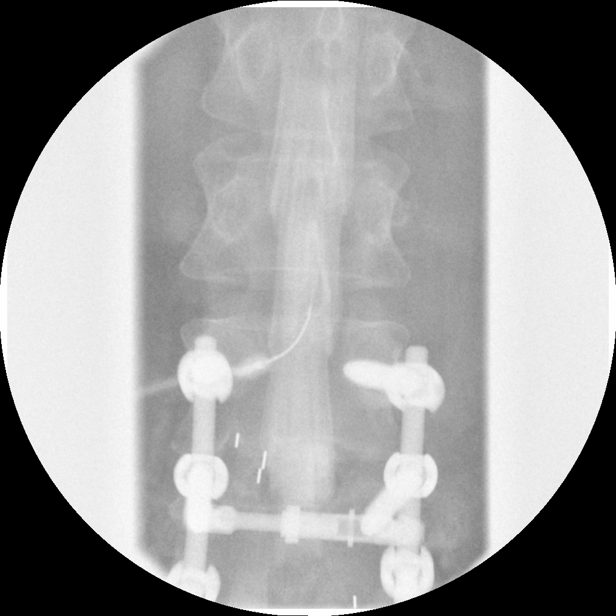

[Series 2: discogram · 1 of 1 slices shown (1 of 2)]
[im 1/1]
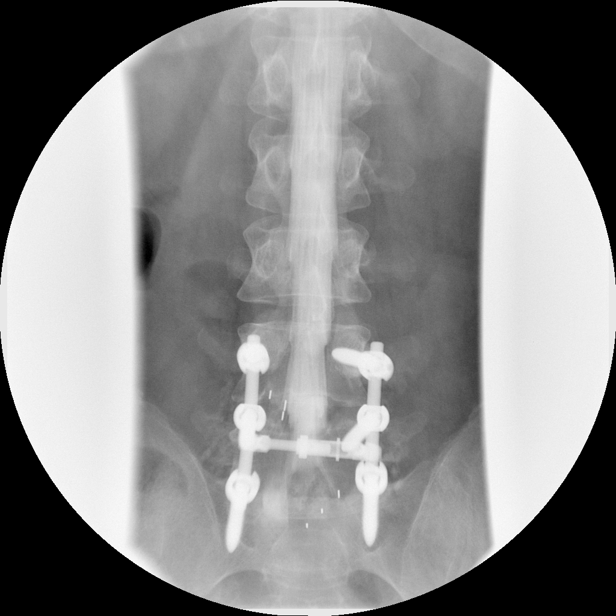

[Series 4: discogram · 1 of 1 slices shown (2 of 2)]
[im 1/1]
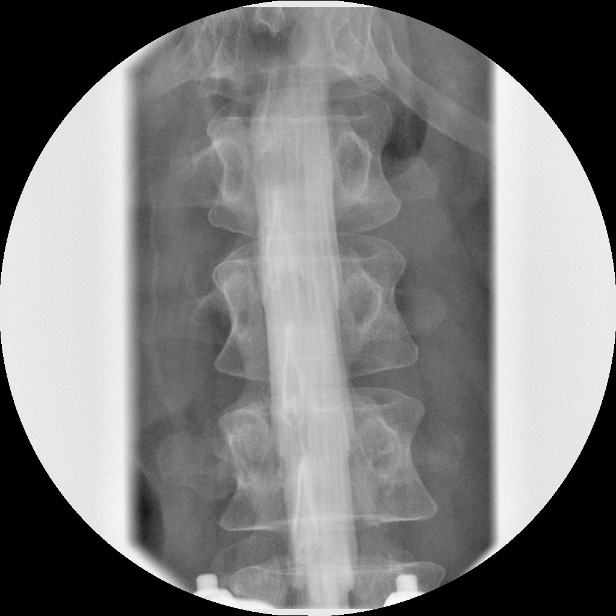

[Series 5: myelogram  white · 1 of 1 slices shown (1 of 10)]
[im 1/1]
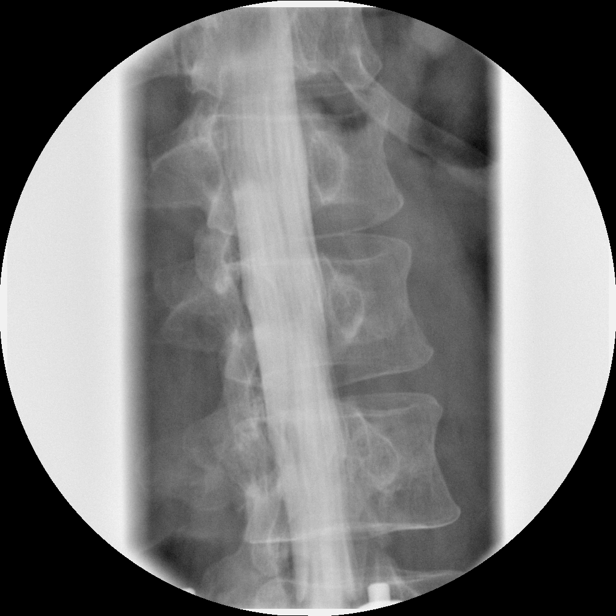

[Series 6: myelogram  white · 1 of 1 slices shown (2 of 10)]
[im 1/1]
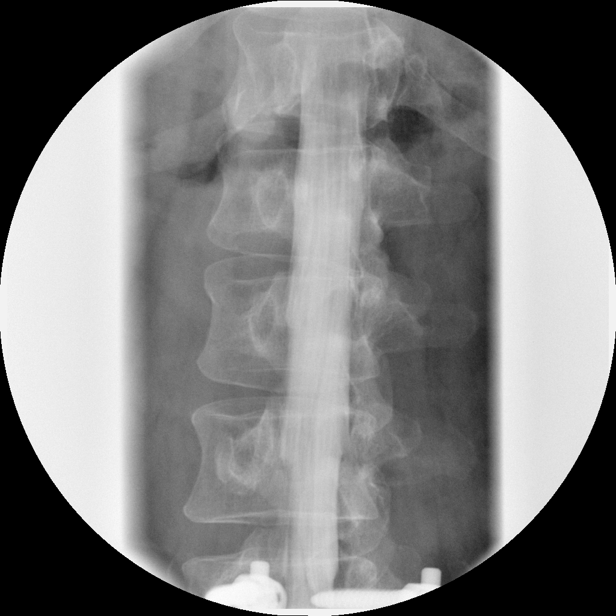

[Series 8: myelogram  white · 1 of 1 slices shown (3 of 10)]
[im 1/1]
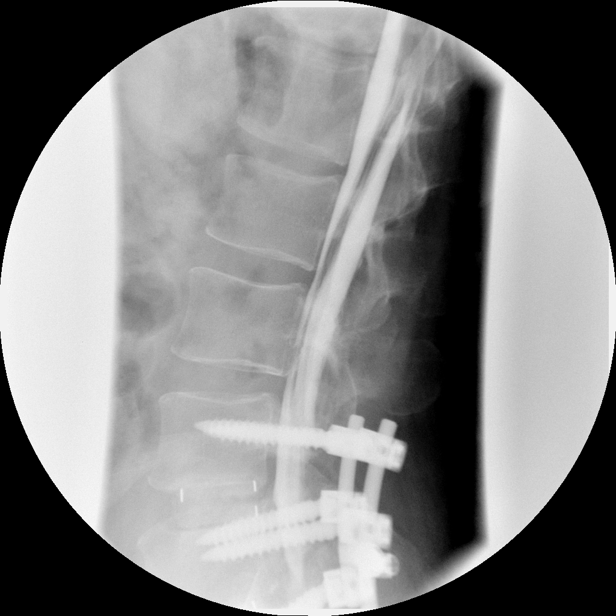

[Series 9: myelogram  white · 1 of 1 slices shown (4 of 10)]
[im 1/1]
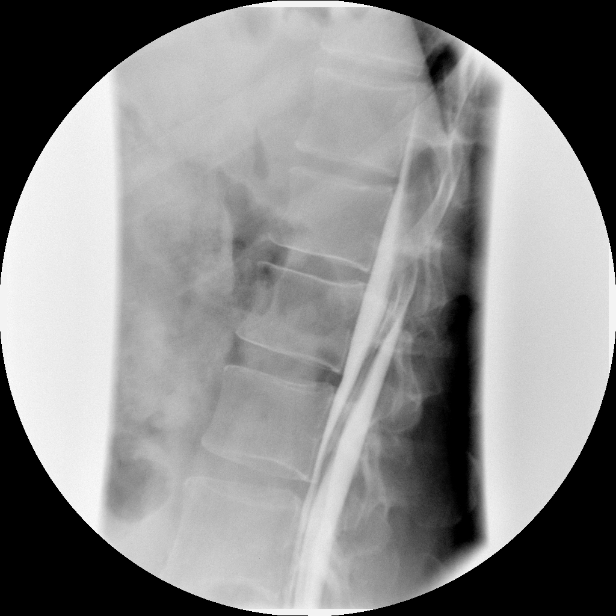

[Series 10: myelogram  white · 1 of 1 slices shown (5 of 10)]
[im 1/1]
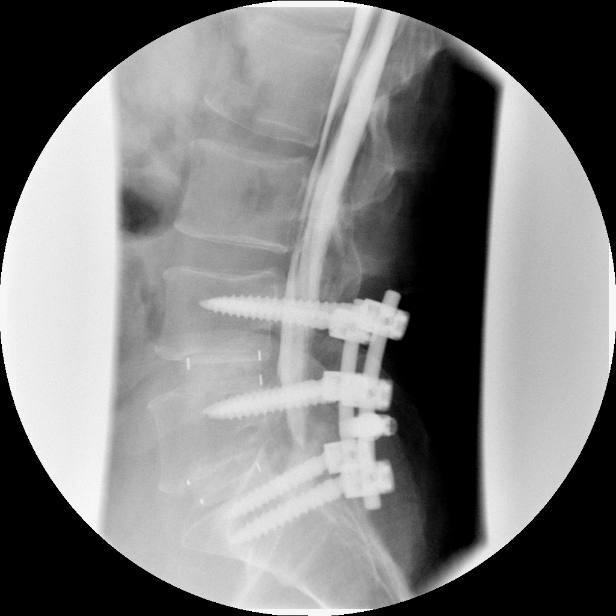

[Series 12: myelogram  white · 1 of 1 slices shown (6 of 10)]
[im 1/1]
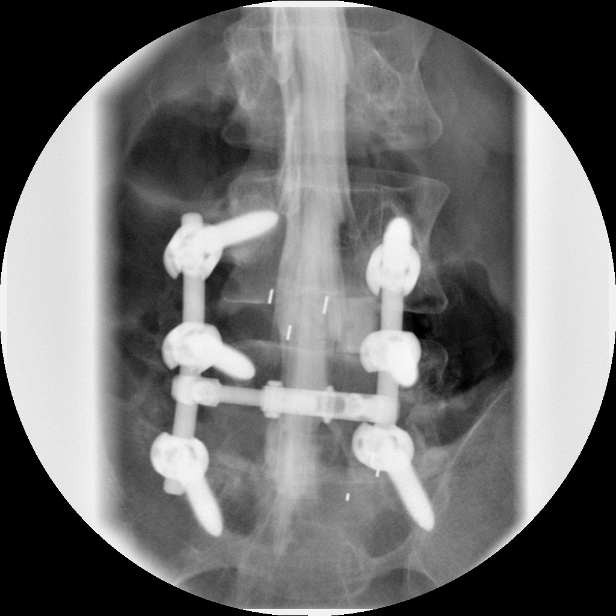

[Series 13: myelogram  white · 1 of 1 slices shown (7 of 10)]
[im 1/1]
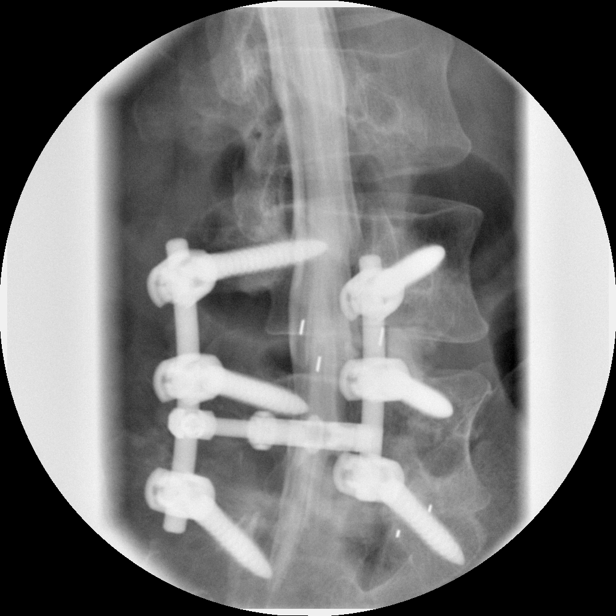

[Series 14: myelogram  white · 1 of 1 slices shown (8 of 10)]
[im 1/1]
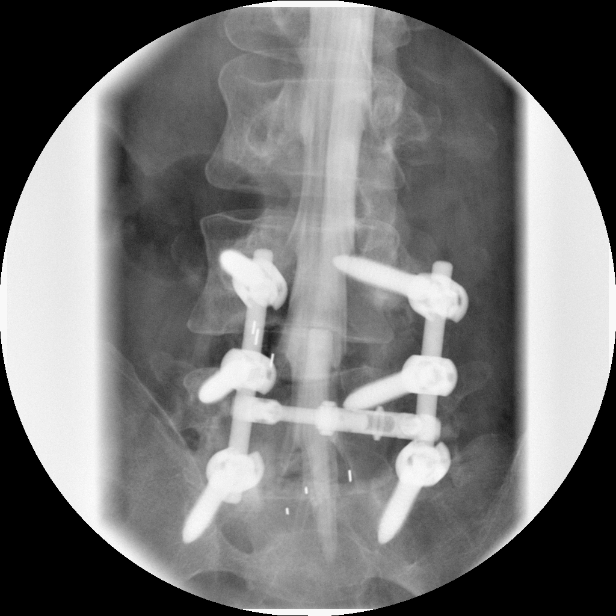

[Series 16: myelogram  white · 1 of 1 slices shown (9 of 10)]
[im 1/1]
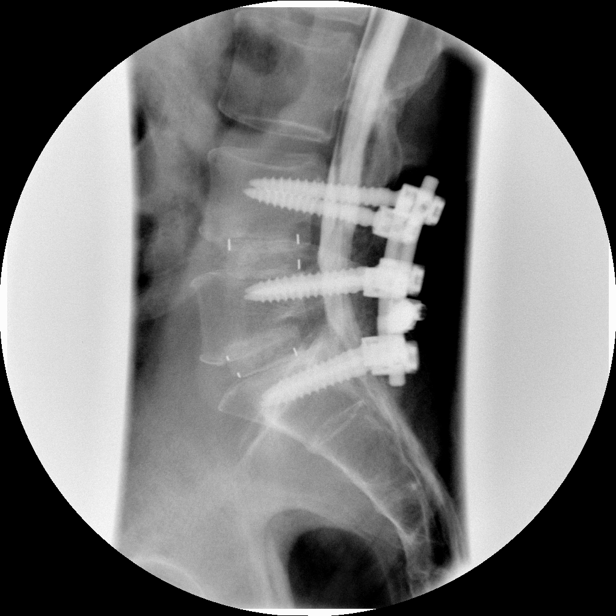

[Series 17: myelogram  white · 1 of 1 slices shown (10 of 10)]
[im 1/1]
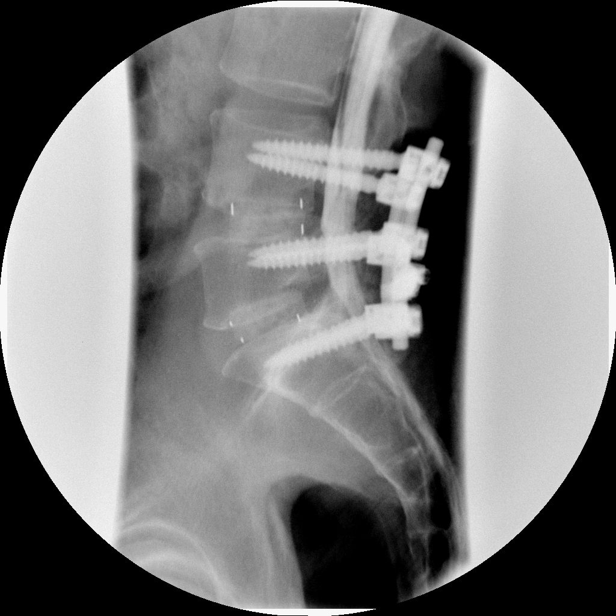

[13 of 17 positions shown; findings below may reference images not displayed]

FINDINGS: Bilateral pedicle screws and cross stabilizing bars are seen in L4, L5, and S1.  There is narrowing of the spinal canal at the L5-S1 level of unknown significance.  L4 and above there is no significant spinal stenosis.  Nerve root sleeves are not effaced.  At L5-S1, there is no significant effacement of the S1 nerve root sleeves. 
 Flexion and extension views were performed.  The patient provided little if any range of motion through these three images.  No evidence of instability.
IMPRESSION: Post operative changes for L4 through S1 fusion.  Some narrowing of the spinal canal at L5-S1.  CT to follow. 
 CT LUMBAR SPINE W/CONTRAST (POST-MYELOGRAM):
FINDINGS: Bilateral pedicle screws are present in L4, L5, and S1.  They are appropriately positioned.  Cross stabilizing bars are present.  Bone graft material is present in the L4-5 and L5-S1 discs.  Solid fusion has not yet occurred through the disc nor has it occurred across the posterior elements.  The conus medullaris terminates at L1.  There is no vertebral body height loss.  
 L1-2:  Unremarkable.  
 L2-3:  Unremarkable.  
 L3-4:  Mild concentric bulge.  No central lateral recess or foraminal narrowing.  
 L4-5:  Posterior laminotomy is noted.  Soft tissue density surrounds the thecal sac likely epidural fibrosis.  The L5 nerve root sleeves do fill with contrast.  The previously noted broad base posterior disc protrusion at this level is no longer present.  Foramina are grossly patent.  
 L5-S1:  Posterior laminotomy is noted.  Soft tissue density surrounds the thecal sac once again likely epidural fibrosis.  The S1 nerve root sleeves do fill with contrast without significant effacement.  S2 nerve root sleeves also fill with contrast.  Some narrowing of the spinal canal at L5-S1 is present and is unchanged and was most likely due to epidural lipomatosis.  There is mild right foraminal narrowing due to disc height loss.  There is some soft tissue material within the inferior left L5-S1 foramen causing foraminal narrowing.  Considerations would include epidural fibrosis or possibly residual disc material.
IMPRESSION: 1.  Posterior fusion and posterior decompression at L4-5 and L5-S1 as described.  
 2.  Biforaminal narrowing at L5-S1 as described. 
 3.  The bony fusion has not yet developed solid callus.

## 2010-05-27 NOTE — Miscellaneous (Signed)
Summary: Orders Update  Clinical Lists Changes  Orders: Added new Service order of No Show NS50 (NS50) - Signed 

## 2010-09-12 NOTE — Op Note (Signed)
NAMEDATRON, Greg Esparza NO.:  [id number]   MEDICAL RECORD NO.:  [id number]          PATIENT TYPE:  INP   LOCATION:  2899                         FACILITY:  [facility]   PHYSICIAN:  Kathaleen Maser. Pool, M.D.    DATE OF BIRTH:  [birth year]   DATE OF PROCEDURE:  12/15/2005  DATE OF DISCHARGE:                                 OPERATIVE REPORT   PREOPERATIVE DIAGNOSES:  1. 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  2. 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   POSTOPERATIVE DIAGNOSES:  1. 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  2. 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   OPERATION PERFORMED:  L4-5 and L5-S1 decompressive laminectomy with  foraminotomies, more than would be required for interbody fusion alone.  L4-  5 and L5-S1 bilateral diskectomies.  L4-5 and L5-S1 posterior lumbar  interbody fusion utilizing tangent interbody allograft wedge, Telamon  interbody PEEK cage and local autografting.  L4 through S1 posterolateral  arthrodesis utilizing segmental pedicle screw instrumentation and local  autografting.   SURGEON:  Kathaleen Maser. Pool, M.D.   ASSISTANT:  Reinaldo Meeker, M.D.   ANESTHESIA:  General endotracheal.   INDICATIONS FOR PROCEDURE:  Greg Esparza is a 45 year old male with history of  back and bilateral lower extremity pain failing all conservative management.  Work-up demonstrates evidence of a right paracentral disk herniation with  compression on the thecal sac and extension into each neural foramen with  resultant neural foraminal stenosis.  The patient also has evidence of  significant central disk herniation at L5-S1 with compression of thecal sac.  The remainder of the lumbar spine looked healthy.  The patient has failed  all efforts at conservative management. He  presents now for decompression  and fusion in hopes of improving his symptoms.   DESCRIPTION OF PROCEDURE:  The patient was taken to the operating room and  placed on the table in the supine position.  After adequate level of  anesthesia was achieved, the patient was positioned prone onto a Wilson  frame and appropriately padded.  The patient's lumbar region was prepped and  draped sterilely.  A 10 blade was used to make a linear skin incision  extending from L3 down to S1.  This was carried down sharply in the midline.  A subperiosteal dissection was then performed exposing the lamina and facet  joints of L3, 4, 5, and S1.  Deep self-retaining retractor was placed.  The  transverse processes of L4, L5 and the sacral ala were then dissected free  bilaterally.  Intraoperative fluoroscopy was used, the levels were  confirmed.  Decompressive laminectomy was then performed using Leksell  rongeurs, Kerrison rongeurs and high speed drill to remove the entire lamina  of L4, the entire lamina of L5 and the superior aspect of the lamina of S1.  Anterior facetectomies were then performed at  L4 and L5 bilaterally.  Superior facetectomies at L5 and S1 were performed bilaterally.  All bone  was then cleaned and used in later autografting.  Ligamentum flavum was then  elevated and resected in piecemeal fashion using Kerrison rongeurs.  Underlying thecal sac was then identified.  Wide decompressive foraminotomy  was then performed along the course of the exiting L4, L5 and S1 nerve roots  bilaterally.  Epidural venous plexus was coagulated and cut.  Thecal sac and  the L5 nerve roots were first retracted on the patient's right side.  The L4-  5 disk space was identified.  Disk herniation was identified.  The disk  space was then incised with a 15 blade in rectangular fashion.  A wide disk  space cleanout was then achieved using pituitary rongeurs and upward angled  pituitary rongeurs and Epstein curets.   Procedure was then repeated on the  contralateral side and then repeated bilaterally at L5-S1.  Returning to L4-  5 with the nerve roots protected, a 12 mm distractor was placed on the  patient's right side.  Thecal sac and nerve roots were protected on the left  side.  Disk space was then reamed and then cut with a 12 mm tangent  instrument.  Soft tissues were removed from the interspace.  A 12 x 26 mm  Telamon cage packed with interbody autograft was then packed into place and  recessed approximately 2 mm from the posterior cortical margin.  Distractor  was removed from the patient's right side. Thecal sac and nerve roots were  protected on the right side.  Once again the disk space was reamed and then  cut with 12 mm instruments.  Soft tissues were removed from the interspace.  Disk space further curettaged.  Morcellized autograft was then packed within  the interspace for later fusion.  A 12 x 26 mm tangent wedge was then  impacted into place and recessed approximately 2 mm from the posterior  cortical margin.  The procedure was then repeated at L5-S1 using 10 x 26 mm  implants.  Pedicles at L4, L5 and S1 were then identified using superficial  landmarks and intraoperative fluoroscopy.  Superficial bone overlying the  pedicle was then removed using high speed drill.  Each pedicle was then  probed using pedicle awl.  Each pedicle awl tract was then probed and found  to be solidly within bone.  Each pedicle awl tract was then tapped with 5.25  mm screw tap.  Screw tap hole was probed and found to be solidly within  bone.  6.75 x 45 radius screws were placed bilaterally at L4.  6.75 x 40 mm  screws placed bilaterally at L5 and 6.75 x 35 mm screws were placed at S1.  Transverse processes and sacral ala were then decorticated using high speed  drill.  Morcellized autograft was packed posterior laterally for later fusion.  Short segment titanium rod was then placed over the screw heads.   Locking caps were engaged.  Locking caps were then compressed with construct  under compression.  Transverse connector was placed.  Final images revealed  good position of bone graft and hardware at proper operative level with  normal alignment of the spine.  The wound was then irrigated one final time.  Gelfoam was placed topically for hemostasis.  A medium Hemovac drain was  left in __________ .  The wound was then closed in layers with Vicryl  sutures.  Steri-Strips and sterile dressing were applied.  There were no  apparent complications.  The patient tolerated the procedure well and  returned to the recovery room postoperatively.           ______________________________  Kathaleen Maser Pool, M.D.     HAP/MEDQ  D:  12/15/2005  T:  12/16/2005  Job:  [id number]

## 2015-07-09 DIAGNOSIS — M545 Low back pain, unspecified: Secondary | ICD-10-CM | POA: Insufficient documentation

## 2015-07-09 DIAGNOSIS — I10 Essential (primary) hypertension: Secondary | ICD-10-CM | POA: Insufficient documentation

## 2015-07-09 DIAGNOSIS — G894 Chronic pain syndrome: Secondary | ICD-10-CM | POA: Insufficient documentation

## 2015-07-09 DIAGNOSIS — E119 Type 2 diabetes mellitus without complications: Secondary | ICD-10-CM | POA: Insufficient documentation

## 2015-07-09 DIAGNOSIS — F32A Depression, unspecified: Secondary | ICD-10-CM | POA: Insufficient documentation

## 2015-07-09 DIAGNOSIS — M5126 Other intervertebral disc displacement, lumbar region: Secondary | ICD-10-CM | POA: Insufficient documentation

## 2015-07-09 DIAGNOSIS — I429 Cardiomyopathy, unspecified: Secondary | ICD-10-CM | POA: Insufficient documentation

## 2015-08-26 DIAGNOSIS — M722 Plantar fascial fibromatosis: Secondary | ICD-10-CM | POA: Insufficient documentation

## 2015-08-26 DIAGNOSIS — E1140 Type 2 diabetes mellitus with diabetic neuropathy, unspecified: Secondary | ICD-10-CM | POA: Insufficient documentation

## 2016-05-23 DIAGNOSIS — Z79899 Other long term (current) drug therapy: Secondary | ICD-10-CM | POA: Insufficient documentation

## 2016-05-23 DIAGNOSIS — I2510 Atherosclerotic heart disease of native coronary artery without angina pectoris: Secondary | ICD-10-CM | POA: Insufficient documentation

## 2018-01-11 ENCOUNTER — Other Ambulatory Visit: Payer: Self-pay

## 2018-01-11 ENCOUNTER — Encounter (HOSPITAL_COMMUNITY): Payer: Self-pay

## 2018-01-11 ENCOUNTER — Emergency Department (HOSPITAL_COMMUNITY)
Admission: EM | Admit: 2018-01-11 | Discharge: 2018-01-11 | Disposition: A | Discharge status: Home / Self Care | Payer: Medicaid Other | Attending: Emergency Medicine | Admitting: Emergency Medicine

## 2018-01-11 DIAGNOSIS — R55 Syncope and collapse: Secondary | ICD-10-CM | POA: Diagnosis present

## 2018-01-11 DIAGNOSIS — E1139 Type 2 diabetes mellitus with other diabetic ophthalmic complication: Secondary | ICD-10-CM | POA: Insufficient documentation

## 2018-01-11 LAB — BASIC METABOLIC PANEL
Anion gap: 13 (ref 5–15)
BUN: 25 mg/dL — ABNORMAL HIGH (ref 6–20)
CO2: 21 mmol/L — ABNORMAL LOW (ref 22–32)
Calcium: 10 mg/dL (ref 8.9–10.3)
Chloride: 105 mmol/L (ref 98–111)
Creatinine, Ser: 2.09 mg/dL — ABNORMAL HIGH (ref 0.61–1.24)
GFR calc Af Amer: 41 mL/min — ABNORMAL LOW (ref >60)
GFR calc non Af Amer: 35 mL/min — ABNORMAL LOW (ref >60)
Glucose, Bld: 190 mg/dL — ABNORMAL HIGH (ref 70–99)
Potassium: 5.1 mmol/L (ref 3.5–5.1)
Sodium: 139 mmol/L (ref 135–145)

## 2018-01-11 LAB — URINALYSIS, ROUTINE W REFLEX MICROSCOPIC
Bilirubin Urine: NEGATIVE
Glucose, UA: 150 mg/dL — AB
Ketones, ur: 5 mg/dL — AB
Leukocytes, UA: NEGATIVE
Nitrite: NEGATIVE
Protein, ur: 100 mg/dL — AB
Specific Gravity, Urine: 1.024 (ref 1.005–1.030)
pH: 5 (ref 5.0–8.0)

## 2018-01-11 LAB — CBG MONITORING, ED: Glucose-Capillary: 178 mg/dL — ABNORMAL HIGH (ref 70–99)

## 2018-01-11 LAB — CBC
HCT: 41.1 % (ref 39.0–52.0)
Hemoglobin: 13.5 g/dL (ref 13.0–17.0)
MCH: 27.8 pg (ref 26.0–34.0)
MCHC: 32.8 g/dL (ref 30.0–36.0)
MCV: 84.7 fL (ref 78.0–100.0)
Platelets: 205 10*3/uL (ref 150–400)
RBC: 4.85 MIL/uL (ref 4.22–5.81)
RDW: 12.6 % (ref 11.5–15.5)
WBC: 10 10*3/uL (ref 4.0–10.5)

## 2018-01-11 LAB — I-STAT TROPONIN, ED: Troponin i, poc: 0 ng/mL (ref 0.00–0.08)

## 2018-01-11 MED ORDER — SODIUM CHLORIDE 0.9 % IV BOLUS
500.0000 mL | Freq: Once | INTRAVENOUS | Status: AC
  Administered 2018-01-11: 500 mL via INTRAVENOUS

## 2018-01-11 NOTE — ED Provider Notes (Signed)
[facility] Provider Note   CSN: [id number] Arrival date & time: 01/11/18  1622     History   Chief Complaint Chief Complaint  Patient presents with  . Near Syncope  . Nausea  . Chest Pain    HPI Greg Esparza is a 52 y.o. male.  HPI   52yM with hx of CAD, CHF, and Bi ventricular pacemaker with near syncope. He was working on a [employer] hitch when he began to feel dizzy as if he may pass out. He felt nauseated and was diaphoretic. No pain. Did not actually pass out. Denies feeling SOB. Symptoms last a few minutes and then resolved. Has not had re-occurrence. Says what he was doing wasn't particularly strenuous. Received 8mg  of zofran and 500cc of NS by EMS.    History reviewed. No pertinent past medical history.  Patient Active Problem List   Diagnosis Date Noted  . DIAB W/OPHTH MANIFESTS TYPE I [JUV TYPE] UNCNTRL 03/22/2007  . ANXIETY 03/22/2007  . POLYNEUROPATHY 03/22/2007  . INGROWING NAIL 03/22/2007     Home Medications    Prior to Admission medications   Not on File    Family History History reviewed. No pertinent family history.  Social History Social History   Tobacco Use  . Smoking status: Not on file  Substance Use Topics  . Alcohol use: Not on file  . Drug use: Not on file     Allergies   Patient has no allergy information on record.   Review of Systems Review of Systems  All systems reviewed and negative, other than as noted in HPI.  Physical Exam Updated Vital Signs Temp (!) 97.1 F (36.2 C) (Oral)   Ht 5\' 8"  (1.727 m)   Wt 88.5 kg   SpO2 100%   BMI 29.65 kg/m   Physical Exam  Constitutional: He appears well-developed and well-nourished. No distress.  HENT:  Head: Normocephalic and atraumatic.  Eyes: Conjunctivae are normal. Right eye exhibits no discharge. Left eye exhibits no discharge.  Neck: Neck supple.  Cardiovascular: Normal rate, regular rhythm and normal heart sounds. Exam  reveals no gallop and no friction rub.  No murmur heard. Pulmonary/Chest: Effort normal and breath sounds normal. No respiratory distress.  Abdominal: Soft. He exhibits no distension. There is no tenderness.  Musculoskeletal: He exhibits no edema or tenderness.  Lower extremities symmetric as compared to each other. No calf tenderness. Negative Homan's. No palpable cords.   Neurological: He is alert.  Skin: Skin is warm and dry.  Psychiatric: He has a normal mood and affect. His behavior is normal. Thought content normal.  Nursing note and vitals reviewed.    ED Treatments / Results  Labs (all labs ordered are listed, but only abnormal results are displayed) Labs Reviewed  BASIC METABOLIC PANEL - Abnormal; Notable for the following components:      Result Value   CO2 21 (*)    Glucose, Bld 190 (*)    BUN 25 (*)    Creatinine, Ser 2.09 (*)    GFR calc non Af Amer 35 (*)    GFR calc Af Amer 41 (*)    All other components within normal limits  URINALYSIS, ROUTINE W REFLEX MICROSCOPIC - Abnormal; Notable for the following components:   APPearance HAZY (*)    Glucose, UA 150 (*)    Hgb urine dipstick SMALL (*)    Ketones, ur 5 (*)    Protein, ur 100 (*)  Bacteria, UA RARE (*)    All other components within normal limits  CBG MONITORING, ED - Abnormal; Notable for the following components:   Glucose-Capillary 178 (*)    All other components within normal limits  CBC  I-STAT TROPONIN, ED    EKG EKG Interpretation  Date/Time:  Tuesday January 11 2018 16:29:31 EDT Ventricular Rate:  85 PR Interval:    QRS Duration: 185 QT Interval:  446 QTC Calculation: 531 R Axis:   -151 Text Interpretation:  VENTRICULAR PACED RHYTHM Confirmed by Raeford Razor [phone]) on 01/11/2018 4:43:20 PM Also confirmed by Raeford Razor [phone]), editor Barbette Hair [phone])  on 01/11/2018 4:45:28 PM   Radiology No results found.  Procedures Procedures (including critical care  time)  Medications Ordered in ED Medications - No data to display   Initial Impression / Assessment and Plan / ED Course  I have reviewed the triage vital signs and the nursing notes.  Pertinent labs & imaging results that were available during my care of the patient were reviewed by me and considered in my medical decision making (see chart for details).     52yM with what strikes me as a near syncopal episode. No further symptoms after 500cc of NS by EMS. Device check w/o significant dysrhythmia. EKG paced.  Consider ACS, but I doubt. Labs as above. May be component of dehydration. Last labs readily available for comparison with BUN/Cr of 22/1.19 on 05/25/2016. He was treated with additional IVF.  Final Clinical Impressions(s) / ED Diagnoses   Final diagnoses:  Near syncope    ED Discharge Orders    None       Raeford Razor, MD 01/21/18 [phone]

## 2018-01-11 NOTE — ED Notes (Signed)
Called [employer]. Will send out representative to interrogate pacemaker

## 2018-01-11 NOTE — ED Triage Notes (Signed)
Per EMS pt was outside for a couple of minutes working on car. Became dizzy, diaphoretic and had N&V.  Per pt these were symptoms of last MI. Pt received fluids and 8mg  zofran enroute with relief

## 2018-11-30 DIAGNOSIS — I5022 Chronic systolic (congestive) heart failure: Secondary | ICD-10-CM | POA: Insufficient documentation

## 2019-01-09 DIAGNOSIS — Z95810 Presence of automatic (implantable) cardiac defibrillator: Secondary | ICD-10-CM | POA: Insufficient documentation

## 2019-03-28 DIAGNOSIS — K529 Noninfective gastroenteritis and colitis, unspecified: Secondary | ICD-10-CM

## 2019-03-28 DIAGNOSIS — F329 Major depressive disorder, single episode, unspecified: Secondary | ICD-10-CM

## 2019-03-28 DIAGNOSIS — I2510 Atherosclerotic heart disease of native coronary artery without angina pectoris: Secondary | ICD-10-CM | POA: Diagnosis not present

## 2019-03-28 DIAGNOSIS — N2889 Other specified disorders of kidney and ureter: Secondary | ICD-10-CM

## 2019-03-28 DIAGNOSIS — I509 Heart failure, unspecified: Secondary | ICD-10-CM

## 2019-03-28 DIAGNOSIS — R112 Nausea with vomiting, unspecified: Secondary | ICD-10-CM

## 2019-03-29 DIAGNOSIS — F329 Major depressive disorder, single episode, unspecified: Secondary | ICD-10-CM | POA: Diagnosis not present

## 2019-03-29 DIAGNOSIS — K529 Noninfective gastroenteritis and colitis, unspecified: Secondary | ICD-10-CM | POA: Diagnosis not present

## 2019-03-29 DIAGNOSIS — I2510 Atherosclerotic heart disease of native coronary artery without angina pectoris: Secondary | ICD-10-CM | POA: Diagnosis not present

## 2019-03-29 DIAGNOSIS — I509 Heart failure, unspecified: Secondary | ICD-10-CM | POA: Diagnosis not present

## 2019-03-30 DIAGNOSIS — I2510 Atherosclerotic heart disease of native coronary artery without angina pectoris: Secondary | ICD-10-CM | POA: Diagnosis not present

## 2019-03-30 DIAGNOSIS — I509 Heart failure, unspecified: Secondary | ICD-10-CM | POA: Diagnosis not present

## 2019-03-30 DIAGNOSIS — F329 Major depressive disorder, single episode, unspecified: Secondary | ICD-10-CM | POA: Diagnosis not present

## 2019-03-30 DIAGNOSIS — K529 Noninfective gastroenteritis and colitis, unspecified: Secondary | ICD-10-CM | POA: Diagnosis not present

## 2022-08-03 ENCOUNTER — Ambulatory Visit: Payer: Medicaid Other | Admitting: Podiatry

## 2022-08-03 DIAGNOSIS — L97512 Non-pressure chronic ulcer of other part of right foot with fat layer exposed: Secondary | ICD-10-CM | POA: Diagnosis not present

## 2022-08-03 DIAGNOSIS — E1142 Type 2 diabetes mellitus with diabetic polyneuropathy: Secondary | ICD-10-CM

## 2022-08-03 MED ORDER — DOXYCYCLINE HYCLATE 100 MG PO TABS: 100.0000 mg | ORAL_TABLET | Freq: Two times a day (BID) | ORAL | 0 refills | Status: AC

## 2022-08-03 NOTE — Progress Notes (Signed)
Subjective:  Patient ID: Greg Esparza, male    DOB: 1965-12-17,  MRN: [id number]  Chief Complaint  Patient presents with   Callouses    callus on R hallux toe, patient tried to self treat the callouse and has turned into an ulcer, patient stated that there was a smell, has placed silverene on the site     56 y.o. male presents with concern for open ulceration on the right great toe.  He says that it started as a callus but then opened up and has been draining a little bit.  He has been putting antibiotic ointment on there.  He was started on antibiotics by his primary care doctor.  Says that the toe has improved since being on the antibiotics.  No past medical history on file.  Allergies  Allergen Reactions   Oxymorphone Other (See Comments)    Urinary retention   Morphine Nausea And Vomiting   Penicillin G Rash    ROS: Negative except as per HPI above  Objective:  General: AAO x3, NAD  Dermatological: Hyperkeratotic lesion with underlying ulceration present at the plantar medial aspect of the right hallux.  Upon debridement of the overlying hyperkeratotic tissue there is noted to be small 0.3 x 0.4 x 0.2 cm ulceration present with healthy granular base.  No significant erythema or edema of the right hallux.  Wound does not probe to bone.  Only to subcutaneous fat level.  C.  Postdebridement pictures below      Vascular:  Dorsalis Pedis artery and Posterior Tibial artery pedal pulses are 2/4 bilateral.  Capillary fill time < 3 sec to all digits.   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   No images are attached to the encounter.  Radiographs:  Deferred Assessment:   1. Ulcer of right foot with fat layer exposed   2. DM type 2 with diabetic peripheral neuropathy      Plan:  Patient was evaluated and treated and all questions answered.  #Ulcer to  level of subcutaneous fat tissue right hallux plantar medial aspect, no evidence of deep infection at this time -We discussed the etiology and factors that are a part of the wound healing process.  We also discussed the risk of infection both soft tissue and osteomyelitis from open ulceration.  Discussed the risk of limb loss if this happens or worsens. -Debridement as below. -Dressed with Betadine, DSD. -Continue home dressing changes daily with Betadine and adhesive bandage sterile dressing -Continue off-loading with surgical shoe.  Surgical shoe was dispensed to the patient at this visit -Vascular testing deferred as patient does have palpable pedal pulses -Last antibiotics: Doxycycline 100 mg twice daily for 10 days, antibiotic prophylaxis primarily as no acute evidence of infection -Imaging: Deferred  Procedure: Excisional Debridement of Wound Rationale: Removal of non-viable soft tissue from the wound to promote healing.  Anesthesia: none Post-Debridement Wound Measurements: 0.3 cm x 0.4 cm x 0.2 cm  Type of Debridement: Sharp Excisional Tissue Removed: Non-viable soft tissue Depth of Debridement: subcutaneous tissue. Technique: Sharp excisional debridement to bleeding, viable wound base.  Dressing: Dry, sterile, compression dressing. Disposition: Patient tolerated procedure well.      Return in about 3 weeks (around 08/24/2022) for Follow-up right hallux ulcer.          Corinna Gab, DPM [facility] / [facility]

## 2022-08-24 ENCOUNTER — Ambulatory Visit: Payer: Medicaid Other | Admitting: Podiatry

## 2022-09-01 ENCOUNTER — Ambulatory Visit: Payer: Medicaid Other | Admitting: Podiatry

## 2022-09-01 DIAGNOSIS — L97512 Non-pressure chronic ulcer of other part of right foot with fat layer exposed: Secondary | ICD-10-CM | POA: Diagnosis not present

## 2022-09-01 DIAGNOSIS — E1142 Type 2 diabetes mellitus with diabetic polyneuropathy: Secondary | ICD-10-CM

## 2022-09-01 NOTE — Progress Notes (Signed)
  Subjective:  Patient ID: Greg Esparza, male    DOB: 30-Jun-1965,  MRN: [id number]  Chief Complaint  Patient presents with   Wound Check    Follow-up right hallux ulcer.     57 y.o. male presents follow-up of ulceration of the right great toe.  He reports that he is doing better he has been applying Betadine and a Band-Aid as instructed.  He denies any drainage recently.  Denies any redness or swelling in the toe.  Has finished a course of doxycycline as instructed.  No past medical history on file.  Allergies  Allergen Reactions   Oxymorphone Other (See Comments)    Urinary retention   Morphine Nausea And Vomiting   Penicillin G Rash    ROS: Negative except as per HPI above  Objective:  General: AAO x3, NAD  Dermatological: Hyperkeratotic lesion with underlying ulceration present at the plantar medial aspect of the right hallux.  Upon debridement of the overlying hyperkeratotic tissue there is noted to be small 0.4 x 0.2 x 0.2 cm ulceration present with healthy granular base.  No significant erythema or edema of the right hallux.  Wound does not probe to bone.  Only to subcutaneous fat level.  Postdebridement pictures below      Vascular:  Dorsalis Pedis artery and Posterior Tibial artery pedal pulses are 2/4 bilateral.  Capillary fill time < 3 sec to all digits.   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   No images are attached to the encounter.  Radiographs:  Deferred Assessment:   1. Ulcer of right foot with fat layer exposed (HCC)   2. DM type 2 with diabetic peripheral neuropathy (HCC)       Plan:  Patient was evaluated and treated and all questions answered.  #Ulcer to level of subcutaneous fat tissue right hallux plantar medial aspect, no evidence of deep infection at this time -Improved from prior with decreased wound size -We discussed  the etiology and factors that are a part of the wound healing process.  We also discussed the risk of infection both soft tissue and osteomyelitis from open ulceration.  Discussed the risk of limb loss if this happens or worsens. -Debridement as below. -Dressed with Betadine, DSD. -Continue home dressing changes daily with Betadine and adhesive bandage sterile dressing -Continue off-loading with surgical shoe. -Vascular testing deferred as patient does have palpable pedal pulses -Last antibiotics: No further antibiotics indicated -Imaging: Deferred  Procedure: Excisional Debridement of Wound Rationale: Removal of non-viable soft tissue from the wound to promote healing.  Anesthesia: none Post-Debridement Wound Measurements: 0.4 cm x 0.1 cm x 0.2 cm  Type of Debridement: Sharp Excisional Tissue Removed: Non-viable soft tissue Depth of Debridement: subcutaneous tissue. Technique: Sharp excisional debridement to bleeding, viable wound base.  Dressing: Dry, sterile, compression dressing. Disposition: Patient tolerated procedure well.      Return in about 4 weeks (around 09/29/2022) for f/u R hallux ulcer.          Corinna Gab, DPM [facility] / [facility]

## 2022-09-29 ENCOUNTER — Ambulatory Visit: Payer: Medicaid Other | Admitting: Podiatry

## 2022-09-29 DIAGNOSIS — L97512 Non-pressure chronic ulcer of other part of right foot with fat layer exposed: Secondary | ICD-10-CM | POA: Diagnosis not present

## 2022-09-29 DIAGNOSIS — E1142 Type 2 diabetes mellitus with diabetic polyneuropathy: Secondary | ICD-10-CM

## 2022-09-29 NOTE — Progress Notes (Signed)
  Subjective:  Patient ID: Greg Esparza, male    DOB: 1965-11-04,  MRN: [id number]  Chief Complaint  Patient presents with   Wound Check    Ulcer to right foot. Patient has not been using any ointment or treated the callus at home. No drainage. No concerns at this time.     57 y.o. male presents follow-up of ulceration of the right great toe.  He reports that he is doing better he has been applying Betadine and a Band-Aid as instructed.  He reports the area is not draining and is doing better than previously.  No past medical history on file.  Allergies  Allergen Reactions   Oxymorphone Other (See Comments)    Urinary retention   Morphine Nausea And Vomiting   Penicillin G Rash    ROS: Negative except as per HPI above  Objective:  General: AAO x3, NAD  Dermatological: Hyperkeratotic lesion with underlying ulceration present at the plantar medial aspect of the right hallux.  Upon debridement of the overlying hyperkeratotic tissue there is noted to be small 0.3 x 0.2 x 0.2 cm ulceration present with healthy granular base.  No significant erythema or edema of the right hallux.  Wound does not probe to bone.  Only to subcutaneous fat level.  Postdebridement pictures below         Vascular:  Dorsalis Pedis artery and Posterior Tibial artery pedal pulses are 2/4 bilateral.  Capillary fill time < 3 sec to all digits.   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   No images are attached to the encounter.  Radiographs:  Deferred Assessment:   1. Ulcer of right foot with fat layer exposed (HCC)   2. DM type 2 with diabetic peripheral neuropathy (HCC)      Plan:  Patient was evaluated and treated and all questions answered.  #Ulcer to level of subcutaneous fat tissue right hallux plantar medial aspect, no evidence of deep infection at this  time -Improved from prior with decreased wound size -We discussed the etiology and factors that are a part of the wound healing process.  We also discussed the risk of infection both soft tissue and osteomyelitis from open ulceration.  Discussed the risk of limb loss if this happens or worsens. -Debridement as below. -Dressed with dry gauze, DSD. -Continue home dressing changes daily with Betadine and adhesive bandage sterile dressing -Continue off-loading with surgical shoe. -Vascular testing deferred as patient does have palpable pedal pulses -Last antibiotics: No further antibiotics indicated -Imaging: Deferred  Procedure: Excisional Debridement of Wound Rationale: Removal of non-viable soft tissue from the wound to promote healing.  Anesthesia: none Post-Debridement Wound Measurements: 0.3 cm x 0.2 cm x 0.2 cm  Type of Debridement: Sharp Excisional Tissue Removed: Non-viable soft tissue Depth of Debridement: subcutaneous tissue. Technique: Sharp excisional debridement to bleeding, viable wound base.  Dressing: Dry, sterile, compression dressing. Disposition: Patient tolerated procedure well.      Return in about 4 weeks (around 10/27/2022) for f/u R hallux ulcer.          Corinna Gab, DPM [facility] / [facility]

## 2022-11-03 ENCOUNTER — Ambulatory Visit: Payer: Medicaid Other | Admitting: Podiatry

## 2022-11-03 DIAGNOSIS — E1142 Type 2 diabetes mellitus with diabetic polyneuropathy: Secondary | ICD-10-CM

## 2022-11-03 DIAGNOSIS — L97512 Non-pressure chronic ulcer of other part of right foot with fat layer exposed: Secondary | ICD-10-CM | POA: Diagnosis not present

## 2022-11-03 NOTE — Progress Notes (Signed)
  Subjective:  Patient ID: Greg Esparza, male    DOB: March 13, 1966,  MRN: [id number]  Chief Complaint  Patient presents with   Wound Check    Ulcer right hallux. Patient stated he is doing well. No issues.     57 y.o. male presents follow-up of ulceration of the right great toe.  He reports that he is doing better he has been applying Betadine and a Band-Aid as instructed.  He reports the area is not draining and is doing better than previously.  No past medical history on file.  Allergies  Allergen Reactions   Oxymorphone Other (See Comments)    Urinary retention   Morphine Nausea And Vomiting   Penicillin G Rash    ROS: Negative except as per HPI above  Objective:  General: AAO x3, NAD  Dermatological: Hyperkeratotic lesion with underlying ulceration present at the plantar medial aspect of the right hallux.  Upon debridement of the overlying hyperkeratotic tissue there is noted to be small 0.2 x 0.1 x 0.1 cm ulceration present with healthy granular base.  No significant erythema or edema of the right hallux.  Wound does not probe to bone.  Only to subcutaneous fat level.  Pre debridement photo:   Vascular:  Dorsalis Pedis artery and Posterior Tibial artery pedal pulses are 2/4 bilateral.  Capillary fill time < 3 sec to all digits.   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   No images are attached to the encounter.  Radiographs:  Deferred Assessment:   1. Ulcer of right foot with fat layer exposed (HCC)   2. DM type 2 with diabetic peripheral neuropathy (HCC)       Plan:  Patient was evaluated and treated and all questions answered.  #Ulcer to level of subcutaneous fat tissue right hallux plantar medial aspect, no evidence of deep infection at this time -Improved from prior with decreased wound size, nearly healed -We discussed the  etiology and factors that are a part of the wound healing process.  We also discussed the risk of infection both soft tissue and osteomyelitis from open ulceration.  Discussed the risk of limb loss if this happens or worsens. -Debridement as below. -Dressed with dry gauze, DSD. -Continue home dressing changes daily with adhesive bandage sterile dressing -Continue off-loading with surgical shoe. -Vascular testing deferred as patient does have palpable pedal pulses -Last antibiotics: No further antibiotics indicated -Imaging: Deferred  Procedure: Excisional Debridement of Wound Rationale: Removal of non-viable soft tissue from the wound to promote healing.  Anesthesia: none Post-Debridement Wound Measurements: 0.2 cm x 0.1 cm x 0.1 cm  Type of Debridement: Sharp Excisional Tissue Removed: Non-viable soft tissue Depth of Debridement: subcutaneous tissue. Technique: Sharp excisional debridement to bleeding, viable wound base.  Dressing: Dry, sterile, compression dressing. Disposition: Patient tolerated procedure well.      Return in about 3 months (around 02/03/2023) for Callus R hallux.          Corinna Gab, DPM [facility] / [facility]

## 2023-02-01 ENCOUNTER — Ambulatory Visit: Payer: Medicaid Other | Admitting: Podiatry

## 2023-02-01 ENCOUNTER — Encounter: Payer: Self-pay | Admitting: Podiatry

## 2023-02-01 DIAGNOSIS — L97512 Non-pressure chronic ulcer of other part of right foot with fat layer exposed: Secondary | ICD-10-CM

## 2023-02-01 DIAGNOSIS — E1142 Type 2 diabetes mellitus with diabetic polyneuropathy: Secondary | ICD-10-CM | POA: Diagnosis not present

## 2023-02-01 MED ORDER — MUPIROCIN 2 % EX OINT: 1.0000 | TOPICAL_OINTMENT | Freq: Every day | CUTANEOUS | 0 refills | Status: DC

## 2023-02-01 MED ORDER — CLINDAMYCIN HCL 300 MG PO CAPS: 300.0000 mg | ORAL_CAPSULE | Freq: Three times a day (TID) | ORAL | 0 refills | Status: AC

## 2023-02-01 NOTE — Progress Notes (Signed)
Subjective:  Patient ID: Greg Esparza, male    DOB: [birth year],  MRN: [id number]  Chief Complaint  Patient presents with   Diabetic Ulcer    Right hallux, plantar medial IPJ. Overlying callus present. Patient noticed some odor and slight drainage filing callus down, been dressing lesion with betadine and bandage    57 y.o. male presents follow-up of ulceration of the right great toe.    Reports there was some drainage and malodor associated with the wound which he had shaved down on his own.  He has been dressing the lesion with Betadine and bandage.  History reviewed. No pertinent past medical history.  Allergies  Allergen Reactions   Oxymorphone Other (See Comments)    Urinary retention   Morphine Nausea And Vomiting   Penicillin G Rash    ROS: Negative except as per HPI above  Objective:  General: AAO x3, NAD  Dermatological: Hyperkeratotic lesion with underlying ulceration present at the plantar medial aspect of the right hallux.  Upon debridement of the overlying hyperkeratotic tissue there is noted to be small 0.2 x 0.1 x 0.1 cm ulceration present with healthy granular base.  No significant erythema or edema of the right hallux.  Wound does not probe to bone.  Only to subcutaneous fat level.    Vascular:  Dorsalis Pedis artery and Posterior Tibial artery pedal pulses are 2/4 bilateral.  Capillary fill time < 3 sec to all digits.   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   No images are attached to the encounter.  Radiographs:  Deferred Assessment:   1. Ulcer of right foot with fat layer exposed (HCC)   2. DM type 2 with diabetic peripheral neuropathy (HCC)        Plan:  Patient was evaluated and treated and all questions answered.  #Ulcer to level of subcutaneous fat tissue right hallux plantar medial aspect, no evidence of deep  infection at this time -Improved from prior with decreased wound size, nearly healed -We discussed the etiology and factors that are a part of the wound healing process.  We also discussed the risk of infection both soft tissue and osteomyelitis from open ulceration.  Discussed the risk of limb loss if this happens or worsens. -Debridement as below. -Dressed with antibiotic ointment dry gauze, DSD. -Continue home dressing changes daily with mupirocin ointment and adhesive bandage sterile dressing -Continue off-loading with surgical shoe. -Vascular testing deferred as patient does have palpable pedal pulses -Last antibiotics: E Rx for cephalexin 500 mg 3 times daily for 5 days -Imaging: Deferred  Procedure: Excisional Debridement of Wound Rationale: Removal of non-viable soft tissue from the wound to promote healing.  Anesthesia: none Post-Debridement Wound Measurements: 0.2 cm x 0.1 cm x 0.1 cm  Type of Debridement: Sharp Excisional Tissue Removed: Non-viable soft tissue Depth of Debridement: subcutaneous tissue. Technique: Sharp excisional debridement to bleeding, viable wound base.  Dressing: Dry, sterile, compression dressing. Disposition: Patient tolerated procedure well.      Return in about 3 weeks (around 02/22/2023) for f/u R hallux ulcer.          Corinna Gab, DPM [facility] / [facility]

## 2023-02-03 ENCOUNTER — Encounter: Payer: Self-pay | Admitting: Physician Assistant

## 2023-02-03 ENCOUNTER — Ambulatory Visit: Payer: Medicaid Other | Admitting: Physician Assistant

## 2023-02-03 VITALS — BP 94/58 | HR 78 | Temp 97.6°F | Ht 68.0 in | Wt 176.0 lb

## 2023-02-03 DIAGNOSIS — I5022 Chronic systolic (congestive) heart failure: Secondary | ICD-10-CM | POA: Diagnosis not present

## 2023-02-03 DIAGNOSIS — E1165 Type 2 diabetes mellitus with hyperglycemia: Secondary | ICD-10-CM | POA: Diagnosis not present

## 2023-02-03 DIAGNOSIS — Z1211 Encounter for screening for malignant neoplasm of colon: Secondary | ICD-10-CM

## 2023-02-03 DIAGNOSIS — Z1212 Encounter for screening for malignant neoplasm of rectum: Secondary | ICD-10-CM

## 2023-02-03 DIAGNOSIS — F331 Major depressive disorder, recurrent, moderate: Secondary | ICD-10-CM

## 2023-02-03 DIAGNOSIS — I10 Essential (primary) hypertension: Secondary | ICD-10-CM

## 2023-02-03 DIAGNOSIS — I2510 Atherosclerotic heart disease of native coronary artery without angina pectoris: Secondary | ICD-10-CM

## 2023-02-03 DIAGNOSIS — Z794 Long term (current) use of insulin: Secondary | ICD-10-CM

## 2023-02-03 MED ORDER — EZETIMIBE 10 MG PO TABS
10.0000 mg | ORAL_TABLET | Freq: Every day | ORAL | 3 refills | Status: DC
Start: 2023-02-03 — End: 2023-06-04

## 2023-02-03 MED ORDER — BUSPIRONE HCL 10 MG PO TABS
10.0000 mg | ORAL_TABLET | Freq: Every day | ORAL | 3 refills | Status: DC
Start: 2023-02-03 — End: 2023-03-12

## 2023-02-03 NOTE — Progress Notes (Signed)
New Patient Office Visit  Subjective    Patient ID: Greg Esparza, male    DOB: 05-01-1965  Age: 57 y.o. MRN: [id number]  CC:  Chief Complaint  Patient presents with   Establish Care    HPI Greg Esparza presents to establish care. Patient currently followed by several specialist. See's Dr. Terri Skains, Cardiologist has a hx  Cardiomyopathy, Systolic CONGESTIVE HEART FAILURE, HTN. See's [facility] for a growth on kidney that is being monitored, [name], PA with [facility] for chronic pain syndrome. He also tells me he has DMII with painful neuropathy. He is due for a diabetic eye exam and interested in the retinopathy screening here at our office. States that he was on Repatha before, but that his insurance stopped covering it.   Saw a dietician before and it did not really help him   Outpatient Encounter Medications as of 02/03/2023  Medication Sig   clindamycin (CLEOCIN) 300 MG capsule Take 1 capsule (300 mg total) by mouth 3 (three) times daily for 5 days.   Continuous Glucose Receiver (FREESTYLE LIBRE 3 READER) DEVI 1 (ONE) EACH AS DIRECTED   Continuous Glucose Sensor (FREESTYLE LIBRE 3 SENSOR) MISC 1 (ONE) EACH EVERY 14 DAYS   digoxin (LANOXIN) 0.125 MG tablet Take 125 mcg by mouth daily.   esomeprazole (NEXIUM) 40 MG capsule Take by mouth 2 (two) times daily.   Evolocumab (REPATHA SURECLICK) 140 MG/ML SOAJ Inject 140 mg into the skin every 21 ( twenty-one) days.   JARDIANCE 25 MG TABS tablet Take 25 mg by mouth daily.   LANTUS SOLOSTAR 100 UNIT/ML Solostar Pen Inject 30 Units into the skin daily.   linaclotide (LINZESS) 290 MCG CAPS capsule Take 290 mcg by mouth as needed (constipation).   mupirocin ointment (BACTROBAN) 2 % Apply 1 Application topically daily.   naloxone (NARCAN) nasal spray 4 mg/0.1 mL INSTILL 1 DOSE SQUIRTED IN NOSTRIL FOR EXCESSIVE SEDATION, REPEAT IN 3 MINUTES IF NO IMPROVEMENT   oxyCODONE (ROXICODONE) 15 MG immediate release tablet Take 15 mg by mouth every  4 (four) hours as needed for pain.    pregabalin (LYRICA) 150 MG capsule Take 150 mg by mouth 3 (three) times daily.   TRINTELLIX 20 MG TABS tablet Take 20 mg by mouth daily.   Vitamin D, Ergocalciferol, (DRISDOL) 50000 units CAPS capsule Take 50,000 Units by mouth every Tuesday.   [DISCONTINUED] busPIRone (BUSPAR) 10 MG tablet Take 10 mg by mouth 2 (two) times daily.   busPIRone (BUSPAR) 10 MG tablet Take 1 tablet (10 mg total) by mouth daily.   ezetimibe (ZETIA) 10 MG tablet Take 1 tablet (10 mg total) by mouth daily.   RYBELSUS 14 MG TABS Take 1 tablet by mouth daily.   [DISCONTINUED] aspirin 325 MG tablet Take 325 mg by mouth daily.    [DISCONTINUED] ezetimibe (ZETIA) 10 MG tablet Take 10 mg by mouth daily.   [DISCONTINUED] insulin lispro protamine-lispro (HUMALOG 75/25 MIX) (75-25) 100 UNIT/ML SUSP injection Inject 30 Units into the skin 2 (two) times daily with a meal.   [DISCONTINUED] ranitidine (ZANTAC) 150 MG capsule Take 150 mg by mouth 2 (two) times daily.   [DISCONTINUED] TRULICITY 1.5 MG/0.5ML SOPN Inject 1.5 mg into the skin every Monday.    No facility-administered encounter medications on file as of 02/03/2023.    Past Medical History:  Diagnosis Date   Anxiety    Cardiac defibrillator in situ    Cardiomyopathy [facility])    Chronic lower back pain  Chronic pain syndrome    Chronic systolic (congestive) heart failure (HCC)    Depression    Diabetic neuropathy (HCC)    Hypertension     Past Surgical History:  Procedure Laterality Date   BACK SURGERY     CARDIAC DEFIBRILLATOR PLACEMENT     In Situ    Family History  Problem Relation Age of Onset   Heart failure Mother    Diabetes Mother    Diabetes Sister    Diabetes Brother    Diabetes Brother    Heart failure Maternal Grandmother    Diabetes Maternal Grandmother    Heart failure Maternal Grandfather    Diabetes Maternal Grandfather    Cancer Paternal Grandmother     Social History   Socioeconomic  History   Marital status: Widowed    Spouse name: Not on file   Number of children: 1   Years of education: Not on file   Highest education level: 8th grade  Occupational History   Not on file  Tobacco Use   Smoking status: Never   Smokeless tobacco: Never  Vaping Use   Vaping status: Never Used  Substance and Sexual Activity   Alcohol use: Never   Drug use: Never   Sexual activity: Yes    Partners: Female    Birth control/protection: None  Other Topics Concern   Not on file  Social History Narrative   Not on file   Social Determinants of Health   Financial Resource Strain: Medium Risk (01/30/2023)   Overall Financial Resource Strain (CARDIA)    Difficulty of Paying Living Expenses: Somewhat hard  Food Insecurity: No Food Insecurity (01/30/2023)   Hunger Vital Sign    Worried About Running Out of Food in the Last Year: Never true    Ran Out of Food in the Last Year: Never true  Transportation Needs: No Transportation Needs (01/30/2023)   PRAPARE - Administrator, Civil Service (Medical): No    Lack of Transportation (Non-Medical): No  Physical Activity: Insufficiently Active (01/30/2023)   Exercise Vital Sign    Days of Exercise per Week: 2 days    Minutes of Exercise per Session: 20 min  Stress: No Stress Concern Present (01/30/2023)   [employer] of Occupational Health - Occupational Stress Questionnaire    Feeling of Stress : Only a little  Social Connections: Moderately Integrated (01/30/2023)   Social Connection and Isolation Panel [NHANES]    Frequency of Communication with Friends and Family: More than three times a week    Frequency of Social Gatherings with Friends and Family: Three times a week    Attends Religious Services: More than 4 times per year    Active Member of Clubs or Organizations: Yes    Attends Banker Meetings: More than 4 times per year    Marital Status: Widowed  Intimate Partner Violence: Not At Risk (02/03/2023)    Humiliation, Afraid, Rape, and Kick questionnaire    Fear of Current or Ex-Partner: No    Emotionally Abused: No    Physically Abused: No    Sexually Abused: No    Review of Systems  Constitutional:  Negative for chills, fever and malaise/fatigue.  HENT:  Negative for ear pain, sinus pain and sore throat.   Respiratory:  Negative for cough and shortness of breath.   Cardiovascular:  Negative for chest pain.  Gastrointestinal:  Negative for abdominal pain and nausea.  Musculoskeletal:  Negative for myalgias.  Neurological:  Negative  for headaches.        Objective    BP (!) 94/58   Pulse 78   Temp 97.6 F (36.4 C)   Ht 5\' 8"  (1.727 m)   Wt 176 lb (79.8 kg)   SpO2 99%   BMI 26.76 kg/m   Physical Exam Vitals reviewed.  Constitutional:      Appearance: Normal appearance. He is normal weight.  HENT:     Right Ear: Tympanic membrane, ear canal and external ear normal.     Left Ear: Tympanic membrane, ear canal and external ear normal.     Mouth/Throat:     Mouth: Mucous membranes are moist.     Dentition: Abnormal dentition. Dental caries present.     Pharynx: Oropharynx is clear. Uvula midline.  Cardiovascular:     Rate and Rhythm: Normal rate and regular rhythm.     Heart sounds: No murmur heard. Pulmonary:     Effort: Pulmonary effort is normal.     Breath sounds: Normal breath sounds.  Abdominal:     General: Abdomen is flat. Bowel sounds are normal.     Palpations: Abdomen is soft.     Tenderness: There is no abdominal tenderness.  Neurological:     Mental Status: He is alert and oriented to person, place, and time.  Psychiatric:        Mood and Affect: Mood normal.        Behavior: Behavior normal.         Assessment & Plan:   Problem List Items Addressed This Visit     Chronic systolic congestive heart failure (HCC)    Continue on current treatment Will continue to follow up with cardiology      Relevant Medications   Evolocumab (REPATHA  SURECLICK) 140 MG/ML SOAJ   ezetimibe (ZETIA) 10 MG tablet   Coronary artery disease involving native heart    Continue taking Zetia 10mg  Continue to monitor symptoms      Relevant Medications   Evolocumab (REPATHA SURECLICK) 140 MG/ML SOAJ   ezetimibe (ZETIA) 10 MG tablet   Other Relevant Orders   Lipid panel (Completed)   Depression    Controlled Continue taking Buspirone 10mg  Continue to monitor symptoms Will adjust treatment as needed       Relevant Medications   busPIRone (BUSPAR) 10 MG tablet   Diabetes (HCC)    Uncontrolled Currently taking Lantus 30 units, Jardience half a 25mg , Rybelsus 14mg  Labs drawn today Will adjust treatment as needed depending on labs      Relevant Medications   JARDIANCE 25 MG TABS tablet   LANTUS SOLOSTAR 100 UNIT/ML Solostar Pen   RYBELSUS 14 MG TABS   Other Relevant Orders   Comprehensive metabolic panel (Completed)   Microalbumin / creatinine urine ratio (Completed)   Hypertension - Primary    Controlled Continue taking Aspirin 325mg  Will continue to monitor        Relevant Medications   Evolocumab (REPATHA SURECLICK) 140 MG/ML SOAJ   ezetimibe (ZETIA) 10 MG tablet   Other Relevant Orders   CBC with Differential/Platelet (Completed)   Hemoglobin A1c (Completed)   TSH (Completed)   Other Visit Diagnoses     Screening for colorectal cancer       Relevant Orders   Ambulatory referral to Gastroenterology       Return in about 3 months (around 05/06/2023) for Chronic, fasting, [name].   [name], [place]

## 2023-02-04 LAB — CBC WITH DIFFERENTIAL/PLATELET
Basophils Absolute: 0 10*3/uL (ref 0.0–0.2)
Basos: 0 %
EOS (ABSOLUTE): 0.1 10*3/uL (ref 0.0–0.4)
Eos: 1 %
Hematocrit: 42.6 % (ref 37.5–51.0)
Hemoglobin: 14.2 g/dL (ref 13.0–17.7)
Immature Grans (Abs): 0 10*3/uL (ref 0.0–0.1)
Immature Granulocytes: 0 %
Lymphocytes Absolute: 2.2 10*3/uL (ref 0.7–3.1)
Lymphs: 31 %
MCH: 28.1 pg (ref 26.6–33.0)
MCHC: 33.3 g/dL (ref 31.5–35.7)
MCV: 84 fL (ref 79–97)
Monocytes Absolute: 0.6 10*3/uL (ref 0.1–0.9)
Monocytes: 8 %
Neutrophils Absolute: 4.2 10*3/uL (ref 1.4–7.0)
Neutrophils: 60 %
Platelets: 254 10*3/uL (ref 150–450)
RBC: 5.06 x10E6/uL (ref 4.14–5.80)
RDW: 12.8 % (ref 11.6–15.4)
WBC: 7 10*3/uL (ref 3.4–10.8)

## 2023-02-04 LAB — COMPREHENSIVE METABOLIC PANEL
ALT: 20 [IU]/L (ref 0–44)
AST: 24 [IU]/L (ref 0–40)
Albumin: 4.7 g/dL (ref 3.8–4.9)
Alkaline Phosphatase: 57 [IU]/L (ref 44–121)
BUN/Creatinine Ratio: 18 (ref 9–20)
BUN: 27 mg/dL — ABNORMAL HIGH (ref 6–24)
Bilirubin Total: 0.4 mg/dL (ref 0.0–1.2)
CO2: 21 mmol/L (ref 20–29)
Calcium: 10 mg/dL (ref 8.7–10.2)
Chloride: 101 mmol/L (ref 96–106)
Creatinine, Ser: 1.46 mg/dL — ABNORMAL HIGH (ref 0.76–1.27)
Globulin, Total: 3.3 g/dL (ref 1.5–4.5)
Glucose: 189 mg/dL — ABNORMAL HIGH (ref 70–99)
Potassium: 4.6 mmol/L (ref 3.5–5.2)
Sodium: 138 mmol/L (ref 134–144)
Total Protein: 8 g/dL (ref 6.0–8.5)
eGFR: 56 mL/min/{1.73_m2} — ABNORMAL LOW (ref >59)

## 2023-02-04 LAB — LIPID PANEL
Chol/HDL Ratio: 5.9 {ratio} — ABNORMAL HIGH (ref 0.0–5.0)
Cholesterol, Total: 208 mg/dL — ABNORMAL HIGH (ref 100–199)
HDL: 35 mg/dL — ABNORMAL LOW (ref >39)
LDL Chol Calc (NIH): 122 mg/dL — ABNORMAL HIGH (ref 0–99)
Triglycerides: 289 mg/dL — ABNORMAL HIGH (ref 0–149)
VLDL Cholesterol Cal: 51 mg/dL — ABNORMAL HIGH (ref 5–40)

## 2023-02-04 LAB — MICROALBUMIN / CREATININE URINE RATIO
Creatinine, Urine: 63.5 mg/dL
Microalb/Creat Ratio: 55 mg/g{creat} — ABNORMAL HIGH (ref 0–29)
Microalbumin, Urine: 35.1 ug/mL

## 2023-02-04 LAB — HEMOGLOBIN A1C
Est. average glucose Bld gHb Est-mCnc: 235 mg/dL
Hgb A1c MFr Bld: 9.8 % — ABNORMAL HIGH (ref 4.8–5.6)

## 2023-02-04 LAB — TSH: TSH: 1.19 u[IU]/mL (ref 0.450–4.500)

## 2023-02-04 NOTE — Assessment & Plan Note (Signed)
Continue taking Zetia 10mg  Continue to monitor symptoms

## 2023-02-04 NOTE — Assessment & Plan Note (Signed)
Controlled Continue taking Buspirone 10mg  Continue to monitor symptoms Will adjust treatment as needed

## 2023-02-04 NOTE — Assessment & Plan Note (Addendum)
Uncontrolled Currently taking Lantus 30 units, Jardience half a 25mg , Rybelsus 14mg  Labs drawn today Will adjust treatment as needed depending on labs

## 2023-02-04 NOTE — Assessment & Plan Note (Signed)
Continue on current treatment Will continue to follow up with cardiology

## 2023-02-05 NOTE — Assessment & Plan Note (Signed)
Controlled Continue taking Aspirin 325mg  Will continue to monitor

## 2023-02-09 ENCOUNTER — Ambulatory Visit: Payer: Medicaid Other | Admitting: Podiatry

## 2023-02-15 ENCOUNTER — Ambulatory Visit: Payer: Medicaid Other | Admitting: Podiatry

## 2023-02-16 ENCOUNTER — Telehealth: Payer: Self-pay | Admitting: Gastroenterology

## 2023-02-16 NOTE — Telephone Encounter (Signed)
Good morning Dr. Chales Abrahams,   We received a referral for this patient to be scheduled for a colonoscopy. Patient has previous history with Dr. Jennye Boroughs in 12/2017. Since then Dr. Jennye Boroughs has retired and patient is now wanting to schedule with you due to being recommended. Records were obtained and scanned into Media for you to review. Please advise on scheduling.  Thank you.

## 2023-02-17 ENCOUNTER — Encounter: Payer: Self-pay | Admitting: Physician Assistant

## 2023-02-17 NOTE — Telephone Encounter (Signed)
OK to schedule in APP clinic or mine RG

## 2023-02-26 ENCOUNTER — Other Ambulatory Visit: Payer: Self-pay | Admitting: Physician Assistant

## 2023-03-03 ENCOUNTER — Encounter: Payer: Self-pay | Admitting: Podiatry

## 2023-03-03 ENCOUNTER — Ambulatory Visit: Payer: Medicaid Other | Admitting: Podiatry

## 2023-03-03 DIAGNOSIS — E11621 Type 2 diabetes mellitus with foot ulcer: Secondary | ICD-10-CM | POA: Diagnosis not present

## 2023-03-03 NOTE — Progress Notes (Signed)
   Chief Complaint  Patient presents with   Diabetic Ulcer    Pre-ulcerative callus f/up right hallux. Morning glucose 138. Evidence of resolved blood blister today. Previous ulcer from last visit callused over with significant hyperkeratotic tissue formation   HPI: 57 y.o. male presents today for f/u of right hallux plantarmedial IPJ ulceration.  He denies any recent drainage.  Wearing shoes today without any removable insoles.    Past Medical History:  Diagnosis Date   Anxiety    Cardiac defibrillator in situ    Cardiomyopathy (HCC)    Chronic lower back pain    Chronic pain syndrome    Chronic systolic (congestive) heart failure (HCC)    Depression    Diabetic neuropathy (HCC)    Hypertension     Past Surgical History:  Procedure Laterality Date   BACK SURGERY     CARDIAC DEFIBRILLATOR PLACEMENT     In Situ    Allergies  Allergen Reactions   Oxymorphone Other (See Comments)    Urinary retention   Gabapentin Other (See Comments)   Lisinopril Cough    cough   Morphine Nausea And Vomiting   Penicillin G Rash    Physical Exam: Hemorrhagic callus on right hallux plantarmedial IPJ.  No ulcer / drainage noted.  No cellulitis or edema.  Palpable pedal pulses right foot.    Assessment/Plan of Care: 1. Type 2 diabetes mellitus with foot ulcer (CODE) (HCC)    Discussed clinical findings with patient today.  Hyperkeratotic tissue shaved from prior ulcer area with sterile #313 blade today.  I will have him bring his other shoe inserts that have a removable insole will put some offloaded padding and there to decrease pressure on the area so that we can maintain this better going forward.  If the pre-ulcerative lesion opens up again, start with the dressing antibiotic ointment as instructed by Dr. Annamary Rummage.   Clerance Lav, DPM, FACFAS [facility]     [address] [place], [place] [address]                Office  [phone]  Fax [phone]

## 2023-03-12 ENCOUNTER — Encounter: Payer: Self-pay | Admitting: Physician Assistant

## 2023-03-12 ENCOUNTER — Ambulatory Visit: Payer: Medicaid Other | Admitting: Physician Assistant

## 2023-03-12 VITALS — BP 82/60 | HR 69 | Temp 97.3°F | Resp 14 | Ht 68.0 in | Wt 174.0 lb

## 2023-03-12 DIAGNOSIS — F331 Major depressive disorder, recurrent, moderate: Secondary | ICD-10-CM

## 2023-03-12 DIAGNOSIS — R631 Polydipsia: Secondary | ICD-10-CM

## 2023-03-12 DIAGNOSIS — E1165 Type 2 diabetes mellitus with hyperglycemia: Secondary | ICD-10-CM

## 2023-03-12 DIAGNOSIS — E11649 Type 2 diabetes mellitus with hypoglycemia without coma: Secondary | ICD-10-CM | POA: Diagnosis not present

## 2023-03-12 DIAGNOSIS — Z23 Encounter for immunization: Secondary | ICD-10-CM

## 2023-03-12 DIAGNOSIS — Z794 Long term (current) use of insulin: Secondary | ICD-10-CM | POA: Diagnosis not present

## 2023-03-12 DIAGNOSIS — M129 Arthropathy, unspecified: Secondary | ICD-10-CM

## 2023-03-12 MED ORDER — BUSPIRONE HCL 10 MG PO TABS
20.0000 mg | ORAL_TABLET | Freq: Two times a day (BID) | ORAL | 3 refills | Status: DC
Start: 2023-03-12 — End: 2023-04-08

## 2023-03-12 MED ORDER — GVOKE HYPOPEN 1-PACK 1 MG/0.2ML ~~LOC~~ SOAJ: 1.0000 | SUBCUTANEOUS | 3 refills | Status: AC

## 2023-03-12 NOTE — Assessment & Plan Note (Signed)
Patient reports one episode of sugar dropping to 52. -Provide patient with emergency glucose pen for use in instances of severe hypoglycemia.

## 2023-03-12 NOTE — Assessment & Plan Note (Signed)
Patient reports muscle aches and bone pain, possibly related to statin therapy (Atorvastatin). -Reduce Atorvastatin dose by half. -Continue Azepamide.

## 2023-03-12 NOTE — Assessment & Plan Note (Signed)
Patient reports feeling dehydrated and dizzy upon standing. Currently drinking 3-4 bottles of water and 2 cans of soda daily. -Encourage increased water intake and reduced soda consumption. -Continue half dose of Jardiance.

## 2023-03-12 NOTE — Patient Instructions (Addendum)
Increase buspirone to 20mg , take twice a day Break statin in half and take half a tablet every day along with the zetia Increase Lantus to 35 units

## 2023-03-12 NOTE — Assessment & Plan Note (Signed)
Poorly controlled with morning sugars ranging from 99-200 and occasional nocturnal sugars close to 300. Patient reports feeling weak and drained. Currently on Lantus 30 units daily. -Increase Lantus to 35 units daily. -Check sugars regularly and schedule a nurse visit in 1-2 weeks to reassess control. -Consider referral to endocrinology if sugars remain poorly controlled.

## 2023-03-12 NOTE — Progress Notes (Unsigned)
Subjective:  Patient ID: Greg Esparza, male    DOB: Mar 20, 1966  Age: 57 y.o. MRN: [id number]  Chief Complaint  Patient presents with   Diabetes    HPI   Patient is here for follow up on diabetes. His blood sugar has been 254, 265, 300. His eyes hurting, headache, polyuria, polydipsia. He would like to discuss his medication.  Discussed the use of AI scribe software for clinical note transcription with the patient, who gave verbal consent to proceed.  Greg Esparza, a 57 year old male with a history of diabetes, hypertension, and kidney issues, presents with low blood pressure and fluctuating blood sugar levels. He reports that his blood sugar levels have been "all over the place," with readings ranging from 99 to almost 200. He also mentions experiencing muscle aches and bone pain, which he attributes to his statin medication.  In addition to his physical symptoms, Greg Esparza expresses significant stress and anxiety related to managing his diabetes. He reports feeling constantly thirsty, weak, and sometimes dizzy, particularly when getting up in the morning. He also mentions having difficulty knowing what to eat and drink to manage his blood sugar levels.  Greg Esparza is currently on multiple medications, including Lantus insulin, which he takes 30 units of once a day, and sometimes an additional 5 units at night if his sugar levels are high. He also takes Jardiance, Rybelsus, and Buspirone for anxiety. He reports occasionally taking an extra dose of Buspirone a couple of times a week when his stress levels are high.          02/03/2023    3:16 PM  Depression screen PHQ 2/9  Decreased Interest 1  Down, Depressed, Hopeless 2  PHQ - 2 Score 3  Altered sleeping 0  Tired, decreased energy 3  Change in appetite 3  Feeling bad or failure about yourself  0  Trouble concentrating 1  Moving slowly or fidgety/restless 0  Suicidal thoughts 0  PHQ-9 Score 10  Difficult doing work/chores  Extremely dIfficult        02/03/2023    3:16 PM  Fall Risk   Falls in the past year? 0  Number falls in past yr: 0  Injury with Fall? 0  Risk for fall due to : No Fall Risks  Follow up Falls evaluation completed    Patient Care Team: [name], [place] as PCP - General (Physician Assistant) Iven Finn., MD as Referring Physician (Neurology) Sandy Salaam, MD as Referring Physician (Cardiology) Perley Jain as Physician Assistant (Pain Medicine) Annamary Rummage Jenelle Mages, DPM as Consulting Physician (Podiatry)   Review of Systems  Constitutional:  Negative for chills, fatigue, fever and unexpected weight change.  HENT:  Negative for congestion, ear pain, sinus pain and sore throat.   Respiratory:  Negative for cough and shortness of breath.   Cardiovascular:  Negative for chest pain and palpitations.  Gastrointestinal:  Negative for abdominal pain, blood in stool, constipation, diarrhea, nausea and vomiting.  Endocrine: Positive for polydipsia. Negative for polyphagia.  Genitourinary:  Negative for dysuria.  Musculoskeletal:  Negative for back pain.  Skin:  Negative for rash.  Neurological:  Negative for headaches.    Current Outpatient Medications on File Prior to Visit  Medication Sig Dispense Refill   atorvastatin (LIPITOR) 40 MG tablet Take 1 tablet by mouth daily.     Continuous Glucose Receiver (FREESTYLE LIBRE 3 READER) DEVI 1 (ONE) EACH AS DIRECTED     Continuous Glucose Sensor (FREESTYLE LIBRE 3  SENSOR) MISC 1 (ONE) EACH EVERY 14 DAYS     digoxin (LANOXIN) 0.125 MG tablet TAKE 1 TABLET BY MOUTH EVERY DAY 90 tablet 1   esomeprazole (NEXIUM) 40 MG capsule Take by mouth 2 (two) times daily.  0   ezetimibe (ZETIA) 10 MG tablet Take 1 tablet (10 mg total) by mouth daily. 30 tablet 3   JARDIANCE 25 MG TABS tablet Take 25 mg by mouth daily.     LANTUS SOLOSTAR 100 UNIT/ML Solostar Pen Inject 35 Units into the skin daily.     linaclotide (LINZESS) 290 MCG CAPS  capsule Take 290 mcg by mouth as needed (constipation).     mupirocin ointment (BACTROBAN) 2 % Apply 1 Application topically daily. 22 g 0   naloxone (NARCAN) nasal spray 4 mg/0.1 mL INSTILL 1 DOSE SQUIRTED IN NOSTRIL FOR EXCESSIVE SEDATION, REPEAT IN 3 MINUTES IF NO IMPROVEMENT     oxyCODONE (ROXICODONE) 15 MG immediate release tablet Take 15 mg by mouth every 4 (four) hours as needed for pain.   0   pregabalin (LYRICA) 75 MG capsule Take 75 mg by mouth 3 (three) times daily.     RYBELSUS 14 MG TABS Take 1 tablet by mouth daily.     TRINTELLIX 20 MG TABS tablet Take 20 mg by mouth daily.  2   Vitamin D, Ergocalciferol, (DRISDOL) 50000 units CAPS capsule Take 50,000 Units by mouth every Tuesday.  0   Evolocumab (REPATHA SURECLICK) 140 MG/ML SOAJ Inject 140 mg into the skin every 21 ( twenty-one) days. (Patient not taking: Reported on 03/12/2023)     No current facility-administered medications on file prior to visit.   Past Medical History:  Diagnosis Date   Anxiety    Cardiac defibrillator in situ    Cardiomyopathy (HCC)    Chronic lower back pain    Chronic pain syndrome    Chronic systolic (congestive) heart failure (HCC)    Depression    Diabetic neuropathy (HCC)    Hypertension    Past Surgical History:  Procedure Laterality Date   BACK SURGERY     CARDIAC DEFIBRILLATOR PLACEMENT     In Situ    Family History  Problem Relation Age of Onset   Heart failure Mother    Diabetes Mother    Diabetes Sister    Diabetes Brother    Diabetes Brother    Heart failure Maternal Grandmother    Diabetes Maternal Grandmother    Heart failure Maternal Grandfather    Diabetes Maternal Grandfather    Cancer Paternal Grandmother    Social History   Socioeconomic History   Marital status: Widowed    Spouse name: Not on file   Number of children: 1   Years of education: Not on file   Highest education level: 8th grade  Occupational History   Not on file  Tobacco Use   Smoking  status: Never   Smokeless tobacco: Never  Vaping Use   Vaping status: Never Used  Substance and Sexual Activity   Alcohol use: Never   Drug use: Never   Sexual activity: Yes    Partners: Female    Birth control/protection: None  Other Topics Concern   Not on file  Social History Narrative   Not on file   Social Determinants of Health   Financial Resource Strain: Medium Risk (01/30/2023)   Overall Financial Resource Strain (CARDIA)    Difficulty of Paying Living Expenses: Somewhat hard  Food Insecurity: No Food Insecurity (01/30/2023)  Hunger Vital Sign    Worried About Running Out of Food in the Last Year: Never true    Ran Out of Food in the Last Year: Never true  Transportation Needs: No Transportation Needs (01/30/2023)   PRAPARE - Administrator, Civil Service (Medical): No    Lack of Transportation (Non-Medical): No  Physical Activity: Insufficiently Active (01/30/2023)   Exercise Vital Sign    Days of Exercise per Week: 2 days    Minutes of Exercise per Session: 20 min  Stress: No Stress Concern Present (01/30/2023)   [employer] of Occupational Health - Occupational Stress Questionnaire    Feeling of Stress : Only a little  Social Connections: Moderately Integrated (01/30/2023)   Social Connection and Isolation Panel [NHANES]    Frequency of Communication with Friends and Family: More than three times a week    Frequency of Social Gatherings with Friends and Family: Three times a week    Attends Religious Services: More than 4 times per year    Active Member of Clubs or Organizations: Yes    Attends Banker Meetings: More than 4 times per year    Marital Status: Widowed    Objective:  BP (!) 82/60   Pulse 69   Temp (!) 97.3 F (36.3 C)   Resp 14   Ht 5\' 8"  (1.727 m)   Wt 174 lb (78.9 kg)   SpO2 99%   BMI 26.46 kg/m      03/12/2023    9:34 AM 02/03/2023    2:45 PM 01/11/2018    9:30 PM  BP/Weight  Systolic BP 82 94 139   Diastolic BP 60 58 86  Wt. (Lbs) 174 176   BMI 26.46 kg/m2 26.76 kg/m2     Physical Exam Vitals reviewed.  Constitutional:      Appearance: Normal appearance.  Cardiovascular:     Rate and Rhythm: Normal rate and regular rhythm.     Heart sounds: Normal heart sounds.  Pulmonary:     Effort: Pulmonary effort is normal.     Breath sounds: Normal breath sounds.  Abdominal:     General: Bowel sounds are normal.     Palpations: Abdomen is soft.     Tenderness: There is no abdominal tenderness.  Neurological:     Mental Status: He is alert and oriented to person, place, and time.  Psychiatric:        Mood and Affect: Mood is anxious.        Speech: Speech normal.        Behavior: Behavior normal.     Diabetic Foot Exam - Simple   No data filed      Lab Results  Component Value Date   WBC 7.0 02/03/2023   HGB 14.2 02/03/2023   HCT 42.6 02/03/2023   PLT 254 02/03/2023   GLUCOSE 189 (H) 02/03/2023   CHOL 208 (H) 02/03/2023   TRIG 289 (H) 02/03/2023   HDL 35 (L) 02/03/2023   LDLCALC 122 (H) 02/03/2023   ALT 20 02/03/2023   AST 24 02/03/2023   NA 138 02/03/2023   K 4.6 02/03/2023   CL 101 02/03/2023   CREATININE 1.46 (H) 02/03/2023   BUN 27 (H) 02/03/2023   CO2 21 02/03/2023   TSH 1.190 02/03/2023   HGBA1C 9.8 (H) 02/03/2023      Assessment & Plan:    Type 2 diabetes mellitus with hyperglycemia, with long-term current use of insulin (HCC) Assessment & Plan:  Poorly controlled with morning sugars ranging from 99-200 and occasional nocturnal sugars close to 300. Patient reports feeling weak and drained. Currently on Lantus 30 units daily. -Increase Lantus to 35 units daily. -Check sugars regularly and schedule a nurse visit in 1-2 weeks to reassess control. -Consider referral to endocrinology if sugars remain poorly controlled.  Orders: -     Gvoke HypoPen 1-Pack; Inject 1 Pen into the skin as directed.  Dispense: 0.2 mL; Refill: 3  Hypoglycemia associated  with diabetes (HCC) Assessment & Plan: Patient reports one episode of sugar dropping to 52. -Provide patient with emergency glucose pen for use in instances of severe hypoglycemia.   Polydipsia Assessment & Plan: Patient reports feeling dehydrated and dizzy upon standing. Currently drinking 3-4 bottles of water and 2 cans of soda daily. -Encourage increased water intake and reduced soda consumption. -Continue half dose of Jardiance.  Orders: -     POCT URINALYSIS DIP (CLINITEK)  Moderate episode of recurrent major depressive disorder (HCC) Assessment & Plan: Patient reports stress and occasional need to double dose of Buspirone 10mg . -Increase Buspirone to 20mg  daily.  Orders: -     busPIRone HCl; Take 2 tablets (20 mg total) by mouth 2 (two) times daily.  Dispense: 120 tablet; Refill: 3  Arthropathy Assessment & Plan: Patient reports muscle aches and bone pain, possibly related to statin therapy (Atorvastatin). -Reduce Atorvastatin dose by half. -Continue Azepamide.      Meds ordered this encounter  Medications   busPIRone (BUSPAR) 10 MG tablet    Sig: Take 2 tablets (20 mg total) by mouth 2 (two) times daily.    Dispense:  120 tablet    Refill:  3   Glucagon (GVOKE HYPOPEN 1-PACK) 1 MG/0.2ML SOAJ    Sig: Inject 1 Pen into the skin as directed.    Dispense:  0.2 mL    Refill:  3    Orders Placed This Encounter  Procedures   POCT URINALYSIS DIP (CLINITEK)     Follow-up: Return for nurse visit.  Assessment and Plan        I,Marla I Leal-Borjas,acting as a scribe for [name], PA.,have documented all relevant documentation on the behalf of [name], PA,as directed by  [name], PA while in the presence of [name], [place].   An After Visit Summary was printed and given to the patient.  [name], [place] [facility] [phone]

## 2023-03-12 NOTE — Assessment & Plan Note (Signed)
Patient reports stress and occasional need to double dose of Buspirone 10mg . -Increase Buspirone to 20mg  daily.

## 2023-03-13 LAB — POCT URINALYSIS DIP (CLINITEK)
Blood, UA: NEGATIVE
Glucose, UA: >=1000 mg/dL — AB
Ketones, POC UA: NEGATIVE mg/dL
Leukocytes, UA: NEGATIVE
Nitrite, UA: NEGATIVE
Spec Grav, UA: 1.025 (ref 1.010–1.025)
Urobilinogen, UA: 0.2 U/dL
pH, UA: 6 (ref 5.0–8.0)

## 2023-03-15 ENCOUNTER — Other Ambulatory Visit: Payer: Self-pay | Admitting: Physician Assistant

## 2023-03-15 NOTE — Telephone Encounter (Unsigned)
Copied from CRM [id number]. Topic: Clinical - Medication Refill >> Mar 15, 2023  9:21 AM Deaijah H wrote: Most Recent Primary Care Visit:  Provider: CRAFT, BRADY  Department: [facility]  Visit Type: OFFICE VISIT  Date: 03/12/2023  Medication: JARDIANCE 25 MG TABS tablet  Has the patient contacted their pharmacy? Yes (Agent: If no, request that the patient contact the pharmacy for the refill. If patient does not wish to contact the pharmacy document the reason why and proceed with request.) (Agent: If yes, when and what did the pharmacy advise?) Was told to call the doctor and they've sent ina reqeust   Is this the correct pharmacy for this prescription? Yes If no, delete pharmacy and type the correct one.  This is the patient's preferred pharmacy:  [id number] - [place], [place] - [address] [place] [address] Phone: [phone] Fax: [phone]   Has the prescription been filled recently? No  Is the patient out of the medication? Yes  Has the patient been seen for an appointment in the last year OR does the patient have an upcoming appointment? {yes/no:20286}  Can we respond through MyChart? {yes/no:20286}  Agent: Please be advised that Rx refills may take up to 3 business days. We ask that you follow-up with your pharmacy.

## 2023-03-16 MED ORDER — JARDIANCE 25 MG PO TABS: 25.0000 mg | ORAL_TABLET | Freq: Every day | ORAL | 3 refills | Status: DC

## 2023-03-19 ENCOUNTER — Ambulatory Visit: Payer: Medicaid Other

## 2023-03-19 DIAGNOSIS — E1165 Type 2 diabetes mellitus with hyperglycemia: Secondary | ICD-10-CM

## 2023-03-19 NOTE — Progress Notes (Signed)
Patient presented today for 1 week nurse visit to follow blood sugars reports sugars have still been high uses free style libre 3 CGM  with average sugars showing to be 270, spoke with [name] who increased his lantus to 40 units daily and referred to endocrinology to look at possible meal time sugars (sliding scale). Refill for sensors and pharmacy along with rx for flonase. Patient to follow up in 2 weeks as a nurse visit due to Thanksgiving Holiday to recheck sugars.

## 2023-03-19 NOTE — Progress Notes (Deleted)
Prescribe flonase Incrase Lantus to 40 units daily Will send referral endocrinology for better sugar control

## 2023-03-30 ENCOUNTER — Other Ambulatory Visit: Payer: Self-pay

## 2023-03-30 DIAGNOSIS — Z794 Long term (current) use of insulin: Secondary | ICD-10-CM

## 2023-04-01 ENCOUNTER — Ambulatory Visit: Payer: Medicaid Other | Admitting: Podiatry

## 2023-04-01 DIAGNOSIS — L97509 Non-pressure chronic ulcer of other part of unspecified foot with unspecified severity: Secondary | ICD-10-CM

## 2023-04-01 DIAGNOSIS — Z794 Long term (current) use of insulin: Secondary | ICD-10-CM | POA: Diagnosis not present

## 2023-04-01 DIAGNOSIS — L97511 Non-pressure chronic ulcer of other part of right foot limited to breakdown of skin: Secondary | ICD-10-CM | POA: Diagnosis not present

## 2023-04-01 DIAGNOSIS — E11621 Type 2 diabetes mellitus with foot ulcer: Secondary | ICD-10-CM | POA: Diagnosis not present

## 2023-04-01 NOTE — Progress Notes (Signed)
Chief Complaint  Patient presents with   Foot Ulcer    Right 1st hallux. C/o drainage, some odor, no pain. Using betadine and an antibiotic ointment. Using a bandaid for covering it. Began draining about 3 days ago. Denies fever, chills, generalized achiness. Last aqc: 8 or 9. FSBS this am 187. No anticoag therapy.    HPI: 57 y.o. male presenting today noting he is having some drainage and odor coming from the preulcerative lesion on the plantar aspect of the right hallux.  He has been using a Band-Aid to cover it.  States it began draining about 3 days ago.  His last A1c was 9.8 two months ago and is due for a repeat draw for another A1c tomorrow.  He noted he forgot to bring his shoes which have removable insoles so that we can offload the hallux area.  Past Medical History:  Diagnosis Date   Anxiety    Cardiac defibrillator in situ    Cardiomyopathy (HCC)    Chronic lower back pain    Chronic pain syndrome    Chronic systolic (congestive) heart failure (HCC)    Depression    Diabetic neuropathy (HCC)    Hypertension     Past Surgical History:  Procedure Laterality Date   BACK SURGERY     CARDIAC DEFIBRILLATOR PLACEMENT     In Situ    Allergies  Allergen Reactions   Oxymorphone Other (See Comments)    Urinary retention   Gabapentin Other (See Comments)   Lisinopril Cough    cough   Morphine Nausea And Vomiting   Penicillin G Rash  . PHYSICAL EXAM: General: The patient is alert and oriented x3 in no acute distress.  Dermatology: Skin is warm, dry and supple bilateral lower extremities. Interspaces are clear of maceration and debris.      Wound 1 description:  Location: Plantar medial right hallux    Depth: Dermis    Wound Border: Hyperkeratotic    Wound Base: Macerated Odor?:  Musty    Surrounding Tissue: No erythema or edema    Infected?:  Not clinically    Necrosis?:  None    Pain?:  Minimal to none    Tunneling: No    Dimensions (cm): 0.5 x 0.4 x 0.2  cm  Vascular: Pedal pulses are diminished  Neurological: Light touch sensation diminished     Latest Ref Rng & Units 04/02/2023   10:45 AM 02/03/2023    4:09 PM  Hemoglobin A1C  Hemoglobin-A1c 4.8 - 5.6 % 8.6  9.8    ASSESSMENT / PLAN OF CARE: 1. Type 2 diabetes mellitus with foot ulcer, with long-term current use of insulin (HCC)   2. Ulcer of right foot limited to breakdown of skin (HCC)     Culture swabs of the wound(s) were obtained today.  Will hold off on prescribing any antibiotics at this time until we receive the culture report.  Informed patient that the wound did not clinically look infected today and I feel that the odor is mostly from the maceration from wearing the Band-Aids and trapping the moisture in the wound.  Once this area dries out more the odor should resolve.  The ulceration was sharply debrided of hyperkeratotic and devitalized soft tissue with sterile #312 blade to the level of dermis.  Hemostasis obtained.  Antibiotic ointment and DSD applied.  Reviewed off-loading with patient.  Reviewed daily dressing changes with patient.  Will use iodine on the area to help  dry the area out quicker.  He can perform Epsom salt soaks or Betadine soaks daily till next appointment.  Patient noted he will bring his shoes by tomorrow or the next day to have the area offloaded on the plantar aspect of the hallux IPJ.  Discussed risks / concerns regarding ulcer with patient and possible sequelae if left untreated.  Stressed importance of infection prevention at home. Short-term goals are: prevent infection, off-load ulcer, heal ulcer Long-term goals are:  prevent recurrence, prevent amputation.   Return in about 2 weeks (around 04/15/2023) for f/u ulcer R hallux.   Clerance Lav, DPM, FACFAS [facility]     [address] [place], [place] [address]                Office [phone]  Fax [phone]

## 2023-04-02 ENCOUNTER — Ambulatory Visit: Payer: Medicaid Other

## 2023-04-02 VITALS — BP 112/64 | HR 70 | Temp 97.8°F | Resp 16 | Ht 68.0 in | Wt 177.0 lb

## 2023-04-02 DIAGNOSIS — J069 Acute upper respiratory infection, unspecified: Secondary | ICD-10-CM | POA: Diagnosis not present

## 2023-04-02 DIAGNOSIS — E1165 Type 2 diabetes mellitus with hyperglycemia: Secondary | ICD-10-CM

## 2023-04-02 LAB — HEMOGLOBIN A1C
Est. average glucose Bld gHb Est-mCnc: 200 mg/dL
Hgb A1c MFr Bld: 8.6 % — ABNORMAL HIGH (ref 4.8–5.6)

## 2023-04-02 NOTE — Progress Notes (Signed)
Acute Office Visit  Subjective:    Patient ID: Greg Esparza, male    DOB: [birth year], 57 y.o.   MRN: [id number]  Chief Complaint  Patient presents with   Cough    HPI:  The patient, with a history of cardiomyopathy, diabetes, and a kidney mass, presents with symptoms suggestive of an upper respiratory tract infection. He reports a constant post-nasal drip and cough, particularly when lying down, which started approximately four to five days ago. Over-the-counter medications, such as Mucinex, have not provided significant relief.  In addition to these symptoms, the patient mentions a kidney mass, previously the size of a quarter, has grown to the size of a tennis ball. Surgery is planned for the near future.  The patient's diabetes control is described as fluctuating, with a recent HbA1c of 9.8. His insulin dosage was recently increased.   Past Medical History:  Diagnosis Date   Anxiety    Cardiac defibrillator in situ    Cardiomyopathy (HCC)    Chronic lower back pain    Chronic pain syndrome    Chronic systolic (congestive) heart failure (HCC)    Depression    Diabetic neuropathy (HCC)    Hypertension     Past Surgical History:  Procedure Laterality Date   BACK SURGERY     CARDIAC DEFIBRILLATOR PLACEMENT     In Situ    Family History  Problem Relation Age of Onset   Heart failure Mother    Diabetes Mother    Diabetes Sister    Diabetes Brother    Diabetes Brother    Heart failure Maternal Grandmother    Diabetes Maternal Grandmother    Heart failure Maternal Grandfather    Diabetes Maternal Grandfather    Cancer Paternal Grandmother     Social History   Socioeconomic History   Marital status: Widowed    Spouse name: Not on file   Number of children: 1   Years of education: Not on file   Highest education level: 8th grade  Occupational History   Not on file  Tobacco Use   Smoking status: Never   Smokeless tobacco: Never  Vaping Use   Vaping  status: Never Used  Substance and Sexual Activity   Alcohol use: Never   Drug use: Never   Sexual activity: Yes    Partners: Female    Birth control/protection: None  Other Topics Concern   Not on file  Social History Narrative   Not on file   Social Determinants of Health   Financial Resource Strain: Medium Risk (01/30/2023)   Overall Financial Resource Strain (CARDIA)    Difficulty of Paying Living Expenses: Somewhat hard  Food Insecurity: No Food Insecurity (01/30/2023)   Hunger Vital Sign    Worried About Running Out of Food in the Last Year: Never true    Ran Out of Food in the Last Year: Never true  Transportation Needs: No Transportation Needs (01/30/2023)   PRAPARE - Administrator, Civil Service (Medical): No    Lack of Transportation (Non-Medical): No  Physical Activity: Insufficiently Active (01/30/2023)   Exercise Vital Sign    Days of Exercise per Week: 2 days    Minutes of Exercise per Session: 20 min  Stress: No Stress Concern Present (01/30/2023)   [employer] of Occupational Health - Occupational Stress Questionnaire    Feeling of Stress : Only a little  Social Connections: Moderately Integrated (01/30/2023)   Social Connection and Isolation Panel [NHANES]  Frequency of Communication with Friends and Family: More than three times a week    Frequency of Social Gatherings with Friends and Family: Three times a week    Attends Religious Services: More than 4 times per year    Active Member of Clubs or Organizations: Yes    Attends Banker Meetings: More than 4 times per year    Marital Status: Widowed  Intimate Partner Violence: Not At Risk (02/03/2023)   Humiliation, Afraid, Rape, and Kick questionnaire    Fear of Current or Ex-Partner: No    Emotionally Abused: No    Physically Abused: No    Sexually Abused: No    Outpatient Medications Prior to Visit  Medication Sig Dispense Refill   atorvastatin (LIPITOR) 40 MG tablet Take 1  tablet by mouth daily.     busPIRone (BUSPAR) 10 MG tablet Take 2 tablets (20 mg total) by mouth 2 (two) times daily. 120 tablet 3   Continuous Glucose Receiver (FREESTYLE LIBRE 3 READER) DEVI 1 (ONE) EACH AS DIRECTED     Continuous Glucose Sensor (FREESTYLE LIBRE 3 SENSOR) MISC 1 (ONE) EACH EVERY 14 DAYS     digoxin (LANOXIN) 0.125 MG tablet TAKE 1 TABLET BY MOUTH EVERY DAY 90 tablet 1   esomeprazole (NEXIUM) 40 MG capsule Take by mouth 2 (two) times daily.  0   ezetimibe (ZETIA) 10 MG tablet Take 1 tablet (10 mg total) by mouth daily. 30 tablet 3   Glucagon (GVOKE HYPOPEN 1-PACK) 1 MG/0.2ML SOAJ Inject 1 Pen into the skin as directed. 0.2 mL 3   JARDIANCE 25 MG TABS tablet Take 1 tablet (25 mg total) by mouth daily. 30 tablet 3   LANTUS SOLOSTAR 100 UNIT/ML Solostar Pen Inject 35 Units into the skin daily.     linaclotide (LINZESS) 290 MCG CAPS capsule Take 290 mcg by mouth as needed (constipation).     mupirocin ointment (BACTROBAN) 2 % Apply 1 Application topically daily. 22 g 0   naloxone (NARCAN) nasal spray 4 mg/0.1 mL INSTILL 1 DOSE SQUIRTED IN NOSTRIL FOR EXCESSIVE SEDATION, REPEAT IN 3 MINUTES IF NO IMPROVEMENT     oxyCODONE (ROXICODONE) 15 MG immediate release tablet Take 15 mg by mouth every 4 (four) hours as needed for pain.   0   pregabalin (LYRICA) 75 MG capsule Take 75 mg by mouth 3 (three) times daily.     RYBELSUS 14 MG TABS Take 1 tablet by mouth daily.     TRINTELLIX 20 MG TABS tablet Take 20 mg by mouth daily.  2   Vitamin D, Ergocalciferol, (DRISDOL) 50000 units CAPS capsule Take 50,000 Units by mouth every Tuesday.  0   No facility-administered medications prior to visit.    Allergies  Allergen Reactions   Oxymorphone Other (See Comments)    Urinary retention   Gabapentin Other (See Comments)   Lisinopril Cough    cough   Morphine Nausea And Vomiting   Penicillin G Rash    Review of Systems  HENT:  Positive for postnasal drip.   Respiratory:  Positive for  cough.        Objective:        04/02/2023   10:10 AM 03/12/2023    9:34 AM 02/03/2023    2:45 PM  Vitals with BMI  Height 5\' 8"  5\' 8"  5\' 8"   Weight 177 lbs 174 lbs 176 lbs  BMI 26.92 26.46 26.77  Systolic 112 82 94  Diastolic 64 60 58  Pulse 70  69 78    Orthostatic VS for the past 72 hrs (Last 3 readings):  Patient Position BP Location Cuff Size  04/02/23 1010 Sitting Right Arm Large     Physical Exam Vitals and nursing note reviewed.  Constitutional:      Comments: HEENT: Right ear with no fluid behind eardrum, no signs of infection. Left ear clean, no signs of infection. Nasal mucosa reddish and inflamed, no drainage. Throat with redness in the back, no signs of streptococcal pharyngitis. NECK: No swollen lymph nodes palpated. CHEST: Lungs clear, no signs of pneumonia.     Health Maintenance Due  Topic Date Due   COVID-19 Vaccine (1) Never done   FOOT EXAM  Never done   OPHTHALMOLOGY EXAM  Never done   HIV Screening  Never done   Hepatitis C Screening  Never done   Zoster Vaccines- Shingrix (1 of 2) Never done   Colonoscopy  Never done    There are no preventive care reminders to display for this patient.   Lab Results  Component Value Date   TSH 1.190 02/03/2023   Lab Results  Component Value Date   WBC 7.0 02/03/2023   HGB 14.2 02/03/2023   HCT 42.6 02/03/2023   MCV 84 02/03/2023   PLT 254 02/03/2023   Lab Results  Component Value Date   NA 138 02/03/2023   K 4.6 02/03/2023   CO2 21 02/03/2023   GLUCOSE 189 (H) 02/03/2023   BUN 27 (H) 02/03/2023   CREATININE 1.46 (H) 02/03/2023   BILITOT 0.4 02/03/2023   ALKPHOS 57 02/03/2023   AST 24 02/03/2023   ALT 20 02/03/2023   PROT 8.0 02/03/2023   ALBUMIN 4.7 02/03/2023   CALCIUM 10.0 02/03/2023   ANIONGAP 13 01/11/2018   EGFR 56 (L) 02/03/2023   Lab Results  Component Value Date   CHOL 208 (H) 02/03/2023   Lab Results  Component Value Date   HDL 35 (L) 02/03/2023   Lab Results   Component Value Date   LDLCALC 122 (H) 02/03/2023   Lab Results  Component Value Date   TRIG 289 (H) 02/03/2023   Lab Results  Component Value Date   CHOLHDL 5.9 (H) 02/03/2023   Lab Results  Component Value Date   HGBA1C 9.8 (H) 02/03/2023       Assessment & Plan:  Viral URI with cough Assessment & Plan: Symptoms of post-nasal drainage, cough, and throat irritation for 4-5 days. Physical exam shows throat redness and nasal inflammation, likely viral etiology. Discussed typical cold duration (7-10 days). Advised against antibiotics due to lack of bacterial infection and potential kidney and heart complications. Prednisone not recommended due to hyperglycemia risk (A1c 9.8). - Recommend Claritin 10 mg or Zyrtec 10 mg OTC - Advise salt water gargling - Encourage steam inhalation - Suggest using an extra pillow or sleeping in a recliner - Recommend ginger, lemon, and honey mixture for throat relief - Advise adequate hydration -Poorly controlled diabetes with A1c of 9.8. Reports fluctuating blood glucose levels. Prednisone avoided due to hyperglycemia risk. - Avoid prednisone        No orders of the defined types were placed in this encounter.   No orders of the defined types were placed in this encounter.    Follow-up: Return if symptoms worsen or fail to improve.  An After Visit Summary was printed and given to the patient. I attest that I have reviewed this visit and agree with the plan scribed by my staff.  Windell Moment, MD [facility] [phone]     Windell Moment, MD [facility] [phone]

## 2023-04-02 NOTE — Assessment & Plan Note (Addendum)
Symptoms of post-nasal drainage, cough, and throat irritation for 4-5 days. Physical exam shows throat redness and nasal inflammation, likely viral etiology. Discussed typical cold duration (7-10 days). Advised against antibiotics due to lack of bacterial infection and potential kidney and heart complications. Prednisone not recommended due to hyperglycemia risk (A1c 9.8). - Recommend Claritin 10 mg or Zyrtec 10 mg OTC - Advise salt water gargling - Encourage steam inhalation - Suggest using an extra pillow or sleeping in a recliner - Recommend ginger, lemon, and honey mixture for throat relief - Advise adequate hydration -Poorly controlled diabetes with A1c of 9.8. Reports fluctuating blood glucose levels. Prednisone avoided due to hyperglycemia risk. - Avoid prednisone

## 2023-04-02 NOTE — Patient Instructions (Signed)
VISIT SUMMARY:  During today's visit, we discussed your recent symptoms of an upper respiratory infection, your diabetes management, cardiomyopathy, and the growth of your kidney mass. We reviewed your current medications and made some recommendations to help manage your symptoms and overall health.  YOUR PLAN:  -UPPER RESPIRATORY INFECTION: You have symptoms of a cold, including post-nasal drip and cough, which are likely caused by a virus. Antibiotics are not needed as they won't help with a viral infection. Instead, you can take Claritin or Zyrtec, gargle with salt water, inhale steam, use an extra pillow or sleep in a recliner, and try a ginger, lemon, and honey mixture for throat relief. Stay hydrated and if symptoms persist beyond a week, consider using Advair.  -DIABETES MELLITUS: Your diabetes is not well controlled, with a recent HbA1c of 9.8. This means your average blood sugar levels have been high. We avoided prescribing prednisone because it can raise your blood sugar levels. Please monitor your blood glucose levels closely.  -CARDIOMYOPATHY: Your heart condition, cardiomyopathy, is being well managed with your current medications. We advised against using Sudafed as it can worsen your condition. Continue taking your medications as prescribed.  -RENAL MASS: The mass on your kidney has grown and is scheduled for surgical removal soon. Please follow up with Dr. Sabino Gasser to manage the surgical scheduling and any other related concerns.  INSTRUCTIONS:  Please follow up if your symptoms persist beyond a week or if they worsen. Call us if you are unable to sleep due to your symptoms. Additionally, continue to monitor your blood glucose levels closely and follow up with Dr. Sabino Gasser for your kidney surgery scheduling and management.

## 2023-04-03 LAB — COMPREHENSIVE METABOLIC PANEL
ALT: 24 [IU]/L (ref 0–44)
AST: 26 [IU]/L (ref 0–40)
Albumin: 4.5 g/dL (ref 3.8–4.9)
Alkaline Phosphatase: 58 [IU]/L (ref 44–121)
BUN/Creatinine Ratio: 15 (ref 9–20)
BUN: 20 mg/dL (ref 6–24)
Bilirubin Total: 0.3 mg/dL (ref 0.0–1.2)
CO2: 23 mmol/L (ref 20–29)
Calcium: 9.5 mg/dL (ref 8.7–10.2)
Chloride: 104 mmol/L (ref 96–106)
Creatinine, Ser: 1.36 mg/dL — ABNORMAL HIGH (ref 0.76–1.27)
Globulin, Total: 3.1 g/dL (ref 1.5–4.5)
Glucose: 130 mg/dL — ABNORMAL HIGH (ref 70–99)
Potassium: 4.9 mmol/L (ref 3.5–5.2)
Sodium: 142 mmol/L (ref 134–144)
Total Protein: 7.6 g/dL (ref 6.0–8.5)
eGFR: 61 mL/min/{1.73_m2} (ref >59)

## 2023-04-07 ENCOUNTER — Encounter: Payer: Self-pay | Admitting: Podiatry

## 2023-04-07 ENCOUNTER — Other Ambulatory Visit: Payer: Self-pay | Admitting: Podiatry

## 2023-04-08 ENCOUNTER — Other Ambulatory Visit: Payer: Self-pay | Admitting: Physician Assistant

## 2023-04-08 DIAGNOSIS — F331 Major depressive disorder, recurrent, moderate: Secondary | ICD-10-CM

## 2023-04-12 ENCOUNTER — Other Ambulatory Visit: Payer: Self-pay | Admitting: Podiatry

## 2023-04-12 ENCOUNTER — Other Ambulatory Visit: Payer: Self-pay | Admitting: Urology

## 2023-04-12 ENCOUNTER — Telehealth: Payer: Self-pay | Admitting: Podiatry

## 2023-04-12 MED ORDER — AMOXICILLIN-POT CLAVULANATE 875-125 MG PO TABS
1.0000 | ORAL_TABLET | Freq: Two times a day (BID) | ORAL | 0 refills | Status: DC
Start: 2023-04-12 — End: 2023-06-22

## 2023-04-12 NOTE — Telephone Encounter (Signed)
Pt called in regards to an antibiotic that was [facility] [place] [place]

## 2023-04-12 NOTE — Progress Notes (Signed)
Messaged patient to ask about PCN allergy and asked if this was an old "allergy" listed to PCN, but if he can take Augmentin without issue.  He stated he could, so Rx sent in today.  Was out of office from Thursday afternoon until today and hadn't seen patient response.

## 2023-04-12 NOTE — Telephone Encounter (Signed)
Patient has called again to request medications, [facility] and [facility]'t gotten anything from [place].

## 2023-04-13 ENCOUNTER — Other Ambulatory Visit: Payer: Self-pay | Admitting: Urology

## 2023-04-13 MED ORDER — MAGNESIUM CITRATE PO SOLN: 1.0000 | Freq: Once | ORAL | Status: DC

## 2023-04-15 ENCOUNTER — Ambulatory Visit: Payer: Medicaid Other | Admitting: Podiatry

## 2023-04-26 NOTE — Progress Notes (Addendum)
 Anesthesia Review:  PCP: [facility]  with [facility]  LOV 03/12/23  and 05/04/23  Cardiologist : DR Meldon LOV 02/09/23  Requested most recent ov note, EKG, stress and echo from DR Meldon on 04/30/23.   Also LVMM for [facility] at [facility] and requested  LOV 02/09/23- in Media dated 04/30/23   BIVICD - last transmission 02/09/23 ? PEr epic- 03/12/23. Rewquested device order completion by fax on 04/30/23.   Chest x-ray : EKG : 04/30/23  Echo : 07/24/2021- listed in media dated 04/30/23  Stress test: Cardiac Cath :  Activity level: cannot do a flight of stairs wtihout difficulty  Sleep Study/ CPAP : none  Fasting Blood Sugar :      / Checks Blood Sugar -- times a day:   Blood Thinner/ Instructions /Last Dose: ASA / Instructions/ Last Dose :    325- Per media note on 05/04/23- okay to hold aspirin for 5 days prior to procedure.    DM- type 2 Freestyle Libre - pt currenlty out of applications to place on skin.  Encouraged pt that he needs to monitor blood glucose.  Significant other with pt at time of preop and both voiced understanding.   Hgba1c-  04/02/23- 8.6 - routed to DR [facility] on 04/30/23.  Jardiance - hold for 72 hours prior to surgery.  Last dose on 05/03/2023.  Lantus - pt takes in the am - PT states he only takes if he eats.  PT states he will not take am of surgery   Rybelsus - daily in am- pt to hold for 24 hours prior to surgery.  Last dose on 05/06/2023 in am.    04/02/23- Seen by Mamatha Sirivol for URI with cough.  At preop on 04/30/23 pt had no symptoms.      Refaxed device orders on 05/04/2023.   Device orders on chart on 05/06/23.

## 2023-04-26 NOTE — Patient Instructions (Signed)
 SURGICAL WAITING ROOM VISITATION  Patients having surgery or a procedure may have no more than 2 support people in the waiting area - these visitors may rotate.    Children under the age of 10 must have an adult with them who is not the patient.  Due to an increase in RSV and influenza rates and associated hospitalizations, children ages 100 and under may not visit patients in [facility] hospitals.  If the patient needs to stay at the hospital during part of their recovery, the visitor guidelines for inpatient rooms apply. Pre-op nurse will coordinate an appropriate time for 1 support person to accompany patient in pre-op.  This support person may not rotate.    Please refer to the [facility] website for the visitor guidelines for Inpatients (after your surgery is over and you are in a regular room).       Your procedure is scheduled on:  05/07/2023    Report to [facility] Main Entrance    Report to admitting at   0900AM   Call this number if you have problems the morning of surgery [phone]   Do not eat food  or drink liquids :After Midnight.                            If you have questions, please contact your surgeon's office.       Oral Hygiene is also important to reduce your risk of infection.                                    Remember - BRUSH YOUR TEETH THE MORNING OF SURGERY WITH YOUR REGULAR TOOTHPASTE  DENTURES WILL BE REMOVED PRIOR TO SURGERY PLEASE DO NOT APPLY Poly grip OR ADHESIVES!!!   Do NOT smoke after Midnight   Stop all vitamins and herbal supplements 7 days before surgery.   Take these medicines the morning of surgery with A SIP OF WATER :  buspar , lanoxin , nexium if needed, lyrica ,   Jardiance - none 72 hours prior to surgery.  Last dose on 05/03/2023  Lantus -  Rybelsus -   DO NOT TAKE ANY ORAL DIABETIC MEDICATIONS DAY OF YOUR SURGERY  Bring CPAP mask and tubing day of surgery.                              You may not have any  metal on your body including hair pins, jewelry, and body piercing             Do not wear make-up, lotions, powders, perfumes/cologne, or deodorant  Do not wear nail polish including gel and S&S, artificial/acrylic nails, or any other type of covering on natural nails including finger and toenails. If you have artificial nails, gel coating, etc. that needs to be removed by a nail salon please have this removed prior to surgery or surgery may need to be canceled/ delayed if the surgeon/ anesthesia feels like they are unable to be safely monitored.   Do not shave  48 hours prior to surgery.               Men may shave face and neck.   Do not bring valuables to the hospital.  IS NOT             RESPONSIBLE   FOR VALUABLES.  Contacts, glasses, dentures or bridgework may not be worn into surgery.   Bring small overnight bag day of surgery.   DO NOT BRING YOUR HOME MEDICATIONS TO THE HOSPITAL. PHARMACY WILL DISPENSE MEDICATIONS LISTED ON YOUR MEDICATION LIST TO YOU DURING YOUR ADMISSION IN THE HOSPITAL!    Patients discharged on the day of surgery will not be allowed to drive home.  Someone NEEDS to stay with you for the first 24 hours after anesthesia.   Special Instructions: Bring a copy of your healthcare power of attorney and living will documents the day of surgery if you haven't scanned them before.              Please read over the following fact sheets you were given: IF YOU HAVE QUESTIONS ABOUT YOUR PRE-OP INSTRUCTIONS PLEASE CALL [phone]   If you received a COVID test during your pre-op visit  it is requested that you wear a mask when out in public, stay away from anyone that may not be feeling well and notify your surgeon if you develop symptoms. If you test positive for Covid or have been in contact with anyone that has tested positive in the last 10 days please notify you surgeon.    [place] - Preparing for Surgery Before surgery, you can play an important  role.  Because skin is not sterile, your skin needs to be as free of germs as possible.  You can reduce the number of germs on your skin by washing with CHG (chlorahexidine gluconate) soap before surgery.  CHG is an antiseptic cleaner which kills germs and bonds with the skin to continue killing germs even after washing. Please DO NOT use if you have an allergy to CHG or antibacterial soaps.  If your skin becomes reddened/irritated stop using the CHG and inform your nurse when you arrive at Short Stay. Do not shave (including legs and underarms) for at least 48 hours prior to the first CHG shower.  You may shave your face/neck. Please follow these instructions carefully:  1.  Shower with CHG Soap the night before surgery and the  morning of Surgery.  2.  If you choose to wash your hair, wash your hair first as usual with your  normal  shampoo.  3.  After you shampoo, rinse your hair and body thoroughly to remove the  shampoo.                           4.  Use CHG as you would any other liquid soap.  You can apply chg directly  to the skin and wash                       Gently with a scrungie or clean washcloth.  5.  Apply the CHG Soap to your body ONLY FROM THE NECK DOWN.   Do not use on face/ open                           Wound or open sores. Avoid contact with eyes, ears mouth and genitals (private parts).                       Wash face,  Genitals (private parts) with your normal soap.             6.  Wash thoroughly, paying special attention to the area where  your surgery  will be performed.  7.  Thoroughly rinse your body with warm water  from the neck down.  8.  DO NOT shower/wash with your normal soap after using and rinsing off  the CHG Soap.                9.  Pat yourself dry with a clean towel.            10.  Wear clean pajamas.            11.  Place clean sheets on your bed the night of your first shower and do not  sleep with pets. Day of Surgery : Do not apply any lotions/deodorants  the morning of surgery.  Please wear clean clothes to the hospital/surgery center.  FAILURE TO FOLLOW THESE INSTRUCTIONS MAY RESULT IN THE CANCELLATION OF YOUR SURGERY PATIENT SIGNATURE_________________________________  NURSE SIGNATURE__________________________________  ________________________________________________________________________

## 2023-04-28 HISTORY — PX: KIDNEY SURGERY: SHX687

## 2023-04-28 HISTORY — PX: CORONARY STENT PLACEMENT: SHX1402

## 2023-04-30 ENCOUNTER — Other Ambulatory Visit: Payer: Self-pay

## 2023-04-30 ENCOUNTER — Ambulatory Visit: Payer: Medicaid Other | Admitting: Podiatry

## 2023-04-30 ENCOUNTER — Encounter (HOSPITAL_COMMUNITY): Payer: Self-pay

## 2023-04-30 ENCOUNTER — Encounter (HOSPITAL_COMMUNITY)
Admission: RE | Admit: 2023-04-30 | Discharge: 2023-04-30 | Disposition: A | Discharge status: Home / Self Care | Payer: Medicaid Other | Source: Ambulatory Visit | Attending: Urology

## 2023-04-30 VITALS — BP 120/76 | HR 68 | Temp 98.1°F | Resp 16 | Ht 68.0 in | Wt 176.0 lb

## 2023-04-30 DIAGNOSIS — Z01818 Encounter for other preprocedural examination: Secondary | ICD-10-CM | POA: Diagnosis present

## 2023-04-30 DIAGNOSIS — I5022 Chronic systolic (congestive) heart failure: Secondary | ICD-10-CM | POA: Insufficient documentation

## 2023-04-30 DIAGNOSIS — E1122 Type 2 diabetes mellitus with diabetic chronic kidney disease: Secondary | ICD-10-CM | POA: Insufficient documentation

## 2023-04-30 DIAGNOSIS — Z95810 Presence of automatic (implantable) cardiac defibrillator: Secondary | ICD-10-CM | POA: Diagnosis not present

## 2023-04-30 DIAGNOSIS — G894 Chronic pain syndrome: Secondary | ICD-10-CM | POA: Diagnosis not present

## 2023-04-30 DIAGNOSIS — I429 Cardiomyopathy, unspecified: Secondary | ICD-10-CM | POA: Diagnosis not present

## 2023-04-30 DIAGNOSIS — Z7984 Long term (current) use of oral hypoglycemic drugs: Secondary | ICD-10-CM | POA: Insufficient documentation

## 2023-04-30 DIAGNOSIS — N2889 Other specified disorders of kidney and ureter: Secondary | ICD-10-CM | POA: Insufficient documentation

## 2023-04-30 DIAGNOSIS — Z7982 Long term (current) use of aspirin: Secondary | ICD-10-CM | POA: Insufficient documentation

## 2023-04-30 DIAGNOSIS — N189 Chronic kidney disease, unspecified: Secondary | ICD-10-CM | POA: Diagnosis not present

## 2023-04-30 DIAGNOSIS — Z87891 Personal history of nicotine dependence: Secondary | ICD-10-CM | POA: Insufficient documentation

## 2023-04-30 DIAGNOSIS — I252 Old myocardial infarction: Secondary | ICD-10-CM | POA: Insufficient documentation

## 2023-04-30 DIAGNOSIS — Z981 Arthrodesis status: Secondary | ICD-10-CM | POA: Diagnosis not present

## 2023-04-30 DIAGNOSIS — E119 Type 2 diabetes mellitus without complications: Secondary | ICD-10-CM

## 2023-04-30 DIAGNOSIS — Z794 Long term (current) use of insulin: Secondary | ICD-10-CM | POA: Diagnosis not present

## 2023-04-30 DIAGNOSIS — I2510 Atherosclerotic heart disease of native coronary artery without angina pectoris: Secondary | ICD-10-CM | POA: Insufficient documentation

## 2023-04-30 DIAGNOSIS — R9431 Abnormal electrocardiogram [ECG] [EKG]: Secondary | ICD-10-CM | POA: Insufficient documentation

## 2023-04-30 DIAGNOSIS — E11621 Type 2 diabetes mellitus with foot ulcer: Secondary | ICD-10-CM

## 2023-04-30 DIAGNOSIS — F32A Depression, unspecified: Secondary | ICD-10-CM | POA: Insufficient documentation

## 2023-04-30 HISTORY — DX: Presence of automatic (implantable) cardiac defibrillator: Z95.810

## 2023-04-30 HISTORY — DX: Acute myocardial infarction, unspecified: I21.9

## 2023-04-30 HISTORY — DX: Gastro-esophageal reflux disease without esophagitis: K21.9

## 2023-04-30 HISTORY — DX: Unspecified osteoarthritis, unspecified site: M19.90

## 2023-04-30 LAB — CBC
HCT: 38.7 % — ABNORMAL LOW (ref 39.0–52.0)
Hemoglobin: 12.9 g/dL — ABNORMAL LOW (ref 13.0–17.0)
MCH: 28.4 pg (ref 26.0–34.0)
MCHC: 33.3 g/dL (ref 30.0–36.0)
MCV: 85.1 fL (ref 80.0–100.0)
Platelets: 162 10*3/uL (ref 150–400)
RBC: 4.55 MIL/uL (ref 4.22–5.81)
RDW: 12.8 % (ref 11.5–15.5)
WBC: 5.6 10*3/uL (ref 4.0–10.5)
nRBC: 0 % (ref 0.0–0.2)

## 2023-04-30 LAB — BASIC METABOLIC PANEL
Anion gap: 6 (ref 5–15)
BUN: 24 mg/dL — ABNORMAL HIGH (ref 6–20)
CO2: 25 mmol/L (ref 22–32)
Calcium: 8.9 mg/dL (ref 8.9–10.3)
Chloride: 106 mmol/L (ref 98–111)
Creatinine, Ser: 1.5 mg/dL — ABNORMAL HIGH (ref 0.61–1.24)
GFR, Estimated: 55 mL/min — ABNORMAL LOW (ref >60)
Glucose, Bld: 205 mg/dL — ABNORMAL HIGH (ref 70–99)
Potassium: 4.4 mmol/L (ref 3.5–5.1)
Sodium: 137 mmol/L (ref 135–145)

## 2023-04-30 LAB — GLUCOSE, CAPILLARY: Glucose-Capillary: 173 mg/dL — ABNORMAL HIGH (ref 70–99)

## 2023-04-30 NOTE — Progress Notes (Signed)
      Chief Complaint  Patient presents with   Foot Ulcer    Ulcer fu, 2 week. Pts states no pain, started antibiotic week ago, been using betadine. FBS yesterday 140. A1c 8.6 last seen his pcp 04/02/23. No anti coags   HPI: 58 y.o. male presents today for follow-up of partial thickness ulceration to the plantar aspect of the right hallux IPJ.  He is almost finished with his antibiotics.  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  He will be in the hospital for a few days.  Denies fever, chills, night sweats.  Past Medical History:  Diagnosis Date   Anxiety    Cardiac defibrillator in situ    Cardiomyopathy (HCC)    Chronic lower back pain    Chronic pain syndrome    Chronic systolic (congestive) heart failure (HCC)    Depression    Diabetic neuropathy (HCC)    Hypertension     Past Surgical History:  Procedure Laterality Date   BACK SURGERY     CARDIAC DEFIBRILLATOR PLACEMENT     In Situ    Allergies  Allergen Reactions   Oxymorphone Other (See Comments)    Urinary retention   Gabapentin Other (See Comments)   Lisinopril Cough    cough   Morphine Nausea And Vomiting   Penicillin G Rash    Physical Exam: Palpable pedal pulses to the foot.  The ulcerated area is closed today.  There is minimal hemorrhagic callus noted.  No maceration is noted.  No drainage is seen.  No surrounding erythema or edema is noted.  No clinical signs of infection are noted  Assessment/Plan of Care: 1. Type 2 diabetes mellitus with foot ulcer (CODE) (HCC)     Discussed clinical findings with patient today.  The area was debrided with sterile #313 blade to remove the hyperkeratotic tissue.  No further treatment is needed at this time.  Informed patient that the wound has resolved but he should keep a close eye on the area for any signs of recurrence.  A felt offloading pad was placed  underneath his shoe insole.  He will finish taking the Augmentin  875 mg 1 tab p.o. twice daily until finished.  Follow-up as needed   Awanda CHARM Imperial, DPM, FACFAS [facility]     [address] [place], [place] [address]                Office [phone]  Fax [phone]

## 2023-05-03 ENCOUNTER — Other Ambulatory Visit: Payer: Self-pay

## 2023-05-03 ENCOUNTER — Other Ambulatory Visit: Payer: Self-pay | Admitting: Physician Assistant

## 2023-05-03 MED ORDER — TRINTELLIX 20 MG PO TABS: 20.0000 mg | ORAL_TABLET | Freq: Every day | ORAL | 2 refills | Status: DC

## 2023-05-03 MED ORDER — RYBELSUS 14 MG PO TABS: 1.0000 | ORAL_TABLET | Freq: Every day | ORAL | 1 refills | Status: DC

## 2023-05-03 NOTE — Telephone Encounter (Signed)
 Copied from CRM [id number]. Topic: Clinical - Medication Refill >> May 03, 2023 12:28 PM Powell HERO wrote: Most Recent Primary Care Visit:  Provider: IVIN SNUFFER  Department: [facility]  Visit Type: ACUTE  Date: 04/02/2023  Medication: TRINTELLIX  20 MG TABS tablet,   Has the patient contacted their pharmacy?  (Agent: If no, request that the patient contact the pharmacy for the refill. If patient does not wish to contact the pharmacy document the reason why and proceed with request.) (Agent: If yes, when and what did the pharmacy advise?)  Is this the correct pharmacy for this prescription?  If no, delete pharmacy and type the correct one.  This is the patient's preferred pharmacy:  [id number] [place], [place] - [address] [place] [address] Phone: [phone] Fax: [phone]   Has the prescription been filled recently?   Is the patient out of the medication?   Has the patient been seen for an appointment in the last year OR does the patient have an upcoming appointment?   Can we respond through MyChart?   Agent: Please be advised that Rx refills may take up to 3 business days. We ask that you follow-up with your pharmacy.

## 2023-05-03 NOTE — Telephone Encounter (Signed)
 Copied from CRM [id number]. Topic: Clinical - Medication Refill >> May 03, 2023 12:28 PM Powell HERO wrote: Most Recent Primary Care Visit:  Provider: IVIN SNUFFER  Department: [facility]  Visit Type: ACUTE  Date: 04/02/2023  Medication: TRINTELLIX  20 MG TABS tablet, RYBELSUS  14 MG TABS  Has the patient contacted their pharmacy? No   Is this the correct pharmacy for this prescription? Yes If no, delete pharmacy and type the correct one.  This is the patient's preferred pharmacy:  [id number] - [place], [place] - [address] [place] [address] Phone: [phone] Fax: [phone]   Has the prescription been filled recently? No  Is the patient out of the medication? Yes  Has the patient been seen for an appointment in the last year OR does the patient have an upcoming appointment? No  Can we respond through MyChart? No, phone call please  Agent: Please be advised that Rx refills may take up to 3 business days. We ask that you follow-up with your pharmacy.

## 2023-05-04 ENCOUNTER — Ambulatory Visit: Payer: Medicaid Other | Admitting: Physician Assistant

## 2023-05-04 ENCOUNTER — Encounter: Payer: Self-pay | Admitting: Physician Assistant

## 2023-05-04 VITALS — BP 90/60 | HR 78 | Temp 97.5°F | Resp 14 | Ht 68.0 in | Wt 181.0 lb

## 2023-05-04 DIAGNOSIS — Z794 Long term (current) use of insulin: Secondary | ICD-10-CM

## 2023-05-04 DIAGNOSIS — E782 Mixed hyperlipidemia: Secondary | ICD-10-CM

## 2023-05-04 DIAGNOSIS — L97509 Non-pressure chronic ulcer of other part of unspecified foot with unspecified severity: Secondary | ICD-10-CM

## 2023-05-04 DIAGNOSIS — E1169 Type 2 diabetes mellitus with other specified complication: Secondary | ICD-10-CM

## 2023-05-04 DIAGNOSIS — E1165 Type 2 diabetes mellitus with hyperglycemia: Secondary | ICD-10-CM

## 2023-05-04 DIAGNOSIS — E11621 Type 2 diabetes mellitus with foot ulcer: Secondary | ICD-10-CM | POA: Diagnosis not present

## 2023-05-04 MED ORDER — LANTUS SOLOSTAR 100 UNIT/ML ~~LOC~~ SOPN: 40.0000 [IU] | PEN_INJECTOR | Freq: Every day | SUBCUTANEOUS | 6 refills | Status: DC

## 2023-05-04 MED ORDER — MUPIROCIN 2 % EX OINT: 1.0000 | TOPICAL_OINTMENT | Freq: Every day | CUTANEOUS | 1 refills | Status: AC | PRN

## 2023-05-04 MED ORDER — FREESTYLE LIBRE 3 SENSOR MISC: 1.0000 | 1 refills | Status: DC

## 2023-05-04 MED ORDER — REPATHA SURECLICK 140 MG/ML ~~LOC~~ SOAJ: 140.0000 mg | SUBCUTANEOUS | 2 refills | Status: DC

## 2023-05-04 NOTE — Progress Notes (Signed)
 Case: [id number] Date/Time: 05/07/23 1045   Procedure: XI RIGHT ROBOTIC ASSISTED RETROPERITONEAL PARTIAL VERSUS RADICAL NEPHRECTOMY (Right) - 180 MINUTES NEEDED FOR CASE   Anesthesia type: General   Pre-op diagnosis: RIGHT RENAL MASS   Location: [facility] ROOM 03 / [facility]   Surgeons: Alvaro Ricardo KATHEE Mickey., MD       DISCUSSION: Greg Esparza is a 58 yo male who presents to PAT prior to surgery above. PMH of former smoking, hx of MI, CAD, cardiomyopathy, CHF, s/p AICD, migraines, IDDM, CKD, chronic pain syndrome, depression, kidney mass, s/p lumbar fusion (L4-S1).  Patient follows with Cardiology at [facility]. He had a MI remotely and has cardiomyopathy.  He had a pacemaker placed in 2009 that was upgraded to a BiV defibrillator in 2014. He did have recurrent ICD shocks in 2023 that led to a fracture in his right ventricular lead. Because of this his old lead was capped and a new RV defibrillator lead was placed in March 2023. He was last seen in clinic on 02/09/23. Last echo done in March of last year showing EF of 55 to 60%. Cardiac clearance received (scanned in media on 05/04/23) that patient is low risk and ok to hold ASA for 5 days.  Patient follows with PCP. He is on insulin  for diabetes. Last A1c was 8.6. He has been referred to Endocrinology.   VS: BP 120/76   Pulse 68   Temp 36.7 C (Oral)   Resp 16   Ht 5' 8 (1.727 m)   Wt 79.8 kg   SpO2 100%   BMI 26.76 kg/m   PROVIDERS: Milon Cleaves, PA Cardiology: Hildegard Rinks, MD  LABS: Labs reviewed: Acceptable for surgery. Kidney function at baseline (all labs ordered are listed, but only abnormal results are displayed)  Labs Reviewed  CBC - Abnormal; Notable for the following components:      Result Value   Hemoglobin 12.9 (*)    HCT 38.7 (*)    All other components within normal limits  BASIC METABOLIC PANEL - Abnormal; Notable for the following components:   Glucose, Bld 205 (*)    BUN 24 (*)    Creatinine, Ser 1.50 (*)    GFR, Estimated  55 (*)    All other components within normal limits  GLUCOSE, CAPILLARY - Abnormal; Notable for the following components:   Glucose-Capillary 173 (*)    All other components within normal limits  TYPE AND SCREEN     IMAGES:   EKG 04/30/23  Atrial-sensed ventricular-paced rhythm, rate 66 Normal sinus rhythm  CV:  Echo 07/24/2021:   SUMMARY The left ventricular size is normal. Mild left ventricular hypertrophy Left ventricular systolic function is normal. LV ejection fraction = 55-60%. Abnormal (paradoxical) septal motion consistent with RV pacemaker. The right ventricle is normal in size and function. The aortic sinus is normal size. The inferior vena cava was not visualized during the exam. There is no pericardial effusion. There is no comparison study available.   Past Medical History:  Diagnosis Date   AICD (automatic cardioverter/defibrillator) present    Anxiety    Arthritis    Cardiac defibrillator in situ    Cardiomyopathy [facility])    Chronic lower back pain    Chronic pain syndrome    Chronic systolic (congestive) heart failure (HCC)    Depression    Diabetic neuropathy (HCC)    GERD (gastroesophageal reflux disease)    Myocardial infarction [facility])     Past Surgical History:  Procedure Laterality Date  BACK SURGERY     CARDIAC DEFIBRILLATOR PLACEMENT     In Situ    MEDICATIONS:  amoxicillin -clavulanate (AUGMENTIN ) 875-125 MG tablet   atorvastatin (LIPITOR) 40 MG tablet   busPIRone  (BUSPAR ) 10 MG tablet   Continuous Glucose Receiver (FREESTYLE LIBRE 3 READER) DEVI   Continuous Glucose Sensor (FREESTYLE LIBRE 3 SENSOR) MISC   digoxin  (LANOXIN ) 0.125 MG tablet   esomeprazole (NEXIUM) 40 MG capsule   ezetimibe  (ZETIA ) 10 MG tablet   Glucagon  (GVOKE HYPOPEN  1-PACK) 1 MG/0.2ML SOAJ   JARDIANCE  25 MG TABS tablet   LANTUS  SOLOSTAR 100 UNIT/ML Solostar Pen   linaclotide (LINZESS) 290 MCG CAPS capsule   mupirocin  ointment (BACTROBAN ) 2 %   naloxone  (NARCAN) nasal spray 4 mg/0.1 mL   oxyCODONE  (ROXICODONE ) 15 MG immediate release tablet   pregabalin  (LYRICA ) 75 MG capsule   RYBELSUS  14 MG TABS   TRINTELLIX  20 MG TABS tablet   Vitamin D, Ergocalciferol, (DRISDOL) 50000 units CAPS capsule   No current facility-administered medications for this encounter.    magnesium  citrate solution 1 Bottle   Burnard CHRISTELLA Odis DEVONNA [facility] Short Stay/Anesthesiology [facility] Phone [phone] 05/04/2023 10:28 AM

## 2023-05-04 NOTE — Assessment & Plan Note (Signed)
 Poorly controlled with blood glucose levels frequently in the 200s despite Lantus  40 units daily. Patient has been using a Freestyle glucose monitor but is currently out of sensors. -Refill Freestyle glucose monitor sensors. -Check HbA1c today to assess overall glycemic control. -Consider referral to endocrinology for further management if HbA1c is not improving.

## 2023-05-04 NOTE — Assessment & Plan Note (Signed)
 Recently treated with antibiotics and topical Bactroban ointment. Patient reports improvement and podiatrist noted healing. -Refill Bactroban ointment.

## 2023-05-04 NOTE — Progress Notes (Addendum)
 Subjective:  Patient ID: Greg Esparza, male    DOB: June 30, 1965  Age: 58 y.o. MRN: [id number]  Chief Complaint  Patient presents with   Medical Management of Chronic Issues    HPI   Diabetes: Patient is taking Rybelsus  14 mg daily, Jardiance  25 mg daily, Lantus  40 units daily. He uses Freestyle libre to check his blood sugar.  Hyperlipidemia: Takes Zetia  10 mg daily, atorvastatin 40 mg daily.  Depression: On trintellix  20 mg daily, buspar  10 mg BID  He has cardiac defibrillator placement.  Discussed the use of AI scribe software for clinical note transcription with the patient, who gave verbal consent to proceed.  History of Present Illness   The patient, a 58 year old male with a history of diabetes, high cholesterol, and anxiety, presents for a chronic follow-up. He reports that his blood sugar levels have been fluctuating and are often in the 200s despite taking 40 units of Lantus  insulin  after meals. He also mentions that he has been without his Freestyle glucose monitor sensor for a few days and needs a refill.  The patient also reports difficulty sleeping, which he attributes to anxiety about his upcoming kidney surgery for cancer. He mentions that he has tried melatonin in the past with some success but has not been taking it recently. He also reports feeling drained and lightheaded when standing up too quickly, which he believes may be related to his fluctuating blood sugar levels.  The patient has been having issues with medication refills, particularly with his Lantus  insulin  and Trintellix . He also mentions that he has stopped taking a statin due to muscle aches and that his insurance is not covering Repatha . He is currently on Buspar  for anxiety, which he takes twice a day, and reports some improvement in symptoms.          05/04/2023   10:24 AM 02/03/2023    3:16 PM  Depression screen PHQ 2/9  Decreased Interest 1 1  Down, Depressed, Hopeless 0 2  PHQ - 2 Score 1 3   Altered sleeping 0 0  Tired, decreased energy 2 3  Change in appetite 0 3  Feeling bad or failure about yourself  0 0  Trouble concentrating 2 1  Moving slowly or fidgety/restless 1 0  Suicidal thoughts 0 0  PHQ-9 Score 6 10  Difficult doing work/chores Somewhat difficult Extremely dIfficult        05/04/2023   10:24 AM  Fall Risk   Falls in the past year? 0  Number falls in past yr: 0  Injury with Fall? 0  Risk for fall due to : No Fall Risks  Follow up Falls evaluation completed    Patient Care Team: Milon Cleaves, [place] as PCP - General (Physician Assistant) Scot Richardson SAUNDERS., MD as Referring Physician (Neurology) Meldon Lash, MD as Referring Physician (Cardiology) Prescilla Nancyann RIGGERS as Physician Assistant (Pain Medicine) Malvin Marsa FALCON, DPM as Consulting Physician (Podiatry) Devere Lonni Righter, MD as Consulting Physician (Urology)   Review of Systems  Constitutional:  Negative for chills, fatigue, fever and unexpected weight change.  HENT:  Negative for congestion, ear pain, sinus pain and sore throat.   Respiratory:  Negative for cough and shortness of breath.   Cardiovascular:  Negative for chest pain and palpitations.  Gastrointestinal:  Negative for abdominal pain, blood in stool, constipation, diarrhea, nausea and vomiting.  Endocrine: Negative for polydipsia.  Genitourinary:  Negative for dysuria.  Musculoskeletal:  Positive for arthralgias and myalgias. Negative for  back pain.  Skin:  Negative for rash.  Neurological:  Positive for dizziness. Negative for headaches.    Current Outpatient Medications on File Prior to Visit  Medication Sig Dispense Refill   amoxicillin -clavulanate (AUGMENTIN ) 875-125 MG tablet Take 1 tablet by mouth 2 (two) times daily. 20 tablet 0   busPIRone  (BUSPAR ) 10 MG tablet TAKE 2 TABLETS BY MOUTH 2 TIMES DAILY. 360 tablet 2   Continuous Glucose Receiver (FREESTYLE LIBRE 3 READER) DEVI 1 (ONE) EACH AS DIRECTED     digoxin   (LANOXIN ) 0.125 MG tablet TAKE 1 TABLET BY MOUTH EVERY DAY 90 tablet 1   esomeprazole (NEXIUM) 40 MG capsule Take 40 mg by mouth daily as needed (acid reflux).  0   ezetimibe  (ZETIA ) 10 MG tablet Take 1 tablet (10 mg total) by mouth daily. 30 tablet 3   Glucagon  (GVOKE HYPOPEN  1-PACK) 1 MG/0.2ML SOAJ Inject 1 Pen into the skin as directed. 0.2 mL 3   JARDIANCE  25 MG TABS tablet Take 1 tablet (25 mg total) by mouth daily. (Patient taking differently: Take 12.5 mg by mouth daily.) 30 tablet 3   linaclotide (LINZESS) 290 MCG CAPS capsule Take 290 mcg by mouth daily as needed (constipation).     naloxone (NARCAN) nasal spray 4 mg/0.1 mL INSTILL 1 DOSE SQUIRTED IN NOSTRIL FOR EXCESSIVE SEDATION, REPEAT IN 3 MINUTES IF NO IMPROVEMENT     oxyCODONE  (ROXICODONE ) 15 MG immediate release tablet Take 15 mg by mouth every 4 (four) hours as needed for pain.   0   pregabalin  (LYRICA ) 75 MG capsule Take 75 mg by mouth 3 (three) times daily.     RYBELSUS  14 MG TABS Take 1 tablet (14 mg total) by mouth daily. 30 tablet 1   TRINTELLIX  20 MG TABS tablet Take 1 tablet (20 mg total) by mouth daily. 30 tablet 2   Vitamin D, Ergocalciferol, (DRISDOL) 50000 units CAPS capsule Take 50,000 Units by mouth every Tuesday.  0   Current Facility-Administered Medications on File Prior to Visit  Medication Dose Route Frequency Provider Last Rate Last Admin   magnesium  citrate solution 1 Bottle  1 Bottle Oral Once Manny, Ricardo KATHEE Raddle., MD       Past Medical History:  Diagnosis Date   AICD (automatic cardioverter/defibrillator) present    Anxiety    Arthritis    Cardiac defibrillator in situ    Cardiomyopathy [facility])    Chronic lower back pain    Chronic pain syndrome    Chronic systolic (congestive) heart failure (HCC)    Depression    Diabetic neuropathy (HCC)    GERD (gastroesophageal reflux disease)    Myocardial infarction [facility])    Past Surgical History:  Procedure Laterality Date   BACK SURGERY     CARDIAC  DEFIBRILLATOR PLACEMENT     In Situ    Family History  Problem Relation Age of Onset   Heart failure Mother    Diabetes Mother    Diabetes Sister    Diabetes Brother    Diabetes Brother    Heart failure Maternal Grandmother    Diabetes Maternal Grandmother    Heart failure Maternal Grandfather    Diabetes Maternal Grandfather    Cancer Paternal Grandmother    Social History   Socioeconomic History   Marital status: Widowed    Spouse name: Not on file   Number of children: 1   Years of education: Not on file   Highest education level: 8th grade  Occupational History  Occupation: Disabled    Comment: 2018  Tobacco Use   Smoking status: Former    Types: Cigarettes   Smokeless tobacco: Never  Vaping Use   Vaping status: Never Used  Substance and Sexual Activity   Alcohol use: Never   Drug use: Never   Sexual activity: Yes    Partners: Female    Birth control/protection: None  Other Topics Concern   Not on file  Social History Narrative   Not on file   Social Drivers of Health   Financial Resource Strain: Medium Risk (01/30/2023)   Overall Financial Resource Strain (CARDIA)    Difficulty of Paying Living Expenses: Somewhat hard  Food Insecurity: No Food Insecurity (01/30/2023)   Hunger Vital Sign    Worried About Running Out of Food in the Last Year: Never true    Ran Out of Food in the Last Year: Never true  Transportation Needs: No Transportation Needs (01/30/2023)   PRAPARE - Administrator, Civil Service (Medical): No    Lack of Transportation (Non-Medical): No  Physical Activity: Insufficiently Active (01/30/2023)   Exercise Vital Sign    Days of Exercise per Week: 2 days    Minutes of Exercise per Session: 20 min  Stress: No Stress Concern Present (01/30/2023)   [employer] of Occupational Health - Occupational Stress Questionnaire    Feeling of Stress : Only a little  Social Connections: Moderately Integrated (01/30/2023)   Social  Connection and Isolation Panel [NHANES]    Frequency of Communication with Friends and Family: More than three times a week    Frequency of Social Gatherings with Friends and Family: Three times a week    Attends Religious Services: More than 4 times per year    Active Member of Clubs or Organizations: Yes    Attends Banker Meetings: More than 4 times per year    Marital Status: Widowed    Objective:  BP 90/60 (Cuff Size: Normal)   Pulse 78   Temp (!) 97.5 F (36.4 C)   Resp 14   Ht 5' 8 (1.727 m)   Wt 181 lb (82.1 kg)   SpO2 100%   BMI 27.52 kg/m      05/04/2023   10:03 AM 04/30/2023    1:15 PM 04/02/2023   10:10 AM  BP/Weight  Systolic BP 90 120 112  Diastolic BP 60 76 64  Wt. (Lbs) 181 176 177  BMI 27.52 kg/m2 26.76 kg/m2 26.91 kg/m2    Physical Exam Vitals reviewed.  Constitutional:      Appearance: Normal appearance.  Cardiovascular:     Rate and Rhythm: Normal rate and regular rhythm.     Heart sounds: Normal heart sounds.  Pulmonary:     Effort: Pulmonary effort is normal.     Breath sounds: Normal breath sounds.  Abdominal:     General: Bowel sounds are normal.     Palpations: Abdomen is soft.     Tenderness: There is no abdominal tenderness.  Neurological:     Mental Status: He is alert and oriented to person, place, and time.  Psychiatric:        Mood and Affect: Mood normal.        Behavior: Behavior normal.     Diabetic Foot Exam - Simple   No data filed      Lab Results  Component Value Date   WBC 5.6 04/30/2023   HGB 12.9 (L) 04/30/2023   HCT 38.7 (L) 04/30/2023  PLT 162 04/30/2023   GLUCOSE 205 (H) 04/30/2023   CHOL 178 05/04/2023   TRIG 292 (H) 05/04/2023   HDL 30 (L) 05/04/2023   LDLCALC 98 05/04/2023   ALT 24 04/02/2023   AST 26 04/02/2023   NA 137 04/30/2023   K 4.4 04/30/2023   CL 106 04/30/2023   CREATININE 1.50 (H) 04/30/2023   BUN 24 (H) 04/30/2023   CO2 25 04/30/2023   TSH 1.190 02/03/2023   HGBA1C 8.6  (H) 04/02/2023      Assessment & Plan:    Type 2 diabetes mellitus with hyperglycemia, with long-term current use of insulin  (HCC) Assessment & Plan: Poorly controlled with blood glucose levels frequently in the 200s despite Lantus  40 units daily. Patient has been using a Freestyle glucose monitor but is currently out of sensors. -Refill Freestyle glucose monitor sensors. -Check HbA1c today to assess overall glycemic control. -Consider referral to endocrinology for further management if HbA1c is not improving.  Orders: -     FreeStyle Libre 3 Sensor; 1 each by Other route every 14 (fourteen) days.  Dispense: 6 each; Refill: 1 -     Lantus  SoloStar; Inject 40 Units into the skin daily.  Dispense: 15 mL; Refill: 6 -     Ambulatory referral to Endocrinology  Mixed hyperlipidemia due to type 2 diabetes mellitus (HCC) Assessment & Plan: Patient stopped statin due to severe muscle aches. Prior authorization issues with Repatha . -Check lipid panel today. -Resolve prior authorization issues for Repatha .  Orders: -     Lipid panel -     Repatha  SureClick; Inject 140 mg into the skin every 14 (fourteen) days.  Dispense: 2 mL; Refill: 2  Type 2 diabetes mellitus with foot ulcer, with long-term current use of insulin  (HCC) Assessment & Plan: Recently treated with antibiotics and topical Bactroban  ointment. Patient reports improvement and podiatrist noted healing. -Refill Bactroban  ointment.   Other orders -     Mupirocin ; Apply 1 Application topically daily as needed (wound care).  Dispense: 30 g; Refill: 1     Meds ordered this encounter  Medications   Continuous Glucose Sensor (FREESTYLE LIBRE 3 SENSOR) MISC    Sig: 1 each by Other route every 14 (fourteen) days.    Dispense:  6 each    Refill:  1   mupirocin  ointment (BACTROBAN ) 2 %    Sig: Apply 1 Application topically daily as needed (wound care).    Dispense:  30 g    Refill:  1   LANTUS  SOLOSTAR 100 UNIT/ML Solostar Pen     Sig: Inject 40 Units into the skin daily.    Dispense:  15 mL    Refill:  6   Evolocumab  (REPATHA  SURECLICK) 140 MG/ML SOAJ    Sig: Inject 140 mg into the skin every 14 (fourteen) days.    Dispense:  2 mL    Refill:  2    Orders Placed This Encounter  Procedures   Lipid panel   Ambulatory referral to Endocrinology   Insomnia Patient reports difficulty falling asleep, possibly related to anxiety about upcoming kidney surgery. -Advise trial of over-the-counter melatonin for sleep.  Upcoming Kidney Surgery Scheduled for this Friday. Recent labs showed slightly decreased kidney function but stable compared to previous. Blood type A positive. -Continue current management. -Check post-operative status and blood glucose control after surgery.           Follow-up: Return in about 3 months (around 08/02/2023) for Chronic, Nola.   LILLETTE Kato I  Leal-Borjas,acting as a neurosurgeon for [employer], PA.,have documented all relevant documentation on the behalf of Nola Angles, PA,as directed by  Nola Angles, PA while in the presence of Nola Angles, [place].   An After Visit Summary was printed and given to the patient.  Nola Angles, [place] [facility] [phone]

## 2023-05-04 NOTE — Assessment & Plan Note (Addendum)
 Patient stopped statin due to severe muscle aches. Prior authorization issues with Repatha. -Check lipid panel today. -Resolve prior authorization issues for Repatha.

## 2023-05-04 NOTE — Anesthesia Preprocedure Evaluation (Addendum)
 Anesthesia Evaluation  Patient identified by MRN, date of birth, ID band Patient awake    Reviewed: Allergy & Precautions, NPO status , Patient's Chart, lab work & pertinent test results  Airway Mallampati: II  TM Distance: >3 FB Neck ROM: Full    Dental  (+) Dental Advisory Given   Pulmonary former smoker   breath sounds clear to auscultation       Cardiovascular hypertension, Pt. on medications + CAD, + Past MI and +CHF  + Cardiac Defibrillator  Rhythm:Regular Rate:Normal     Neuro/Psych negative neurological ROS     GI/Hepatic Neg liver ROS,GERD  ,,  Endo/Other  diabetes, Type 2    Renal/GU negative Renal ROS     Musculoskeletal  (+) Arthritis ,    Abdominal   Peds  Hematology negative hematology ROS (+)   Anesthesia Other Findings   Reproductive/Obstetrics                             Anesthesia Physical Anesthesia Plan  ASA: 3  Anesthesia Plan: General   Post-op Pain Management: Ofirmev  IV (intra-op)* and Ketamine  IV*   Induction: Intravenous  PONV Risk Score and Plan: 2 and Dexamethasone , Ondansetron  and Treatment may vary due to age or medical condition  Airway Management Planned: Oral ETT  Additional Equipment:   Intra-op Plan:   Post-operative Plan: Extubation in OR  Informed Consent: I have reviewed the patients History and Physical, chart, labs and discussed the procedure including the risks, benefits and alternatives for the proposed anesthesia with the patient or authorized representative who has indicated his/her understanding and acceptance.     Dental advisory given  Plan Discussed with: CRNA  Anesthesia Plan Comments: ( )        Anesthesia Quick Evaluation

## 2023-05-05 ENCOUNTER — Other Ambulatory Visit: Payer: Self-pay

## 2023-05-05 LAB — LIPID PANEL
Chol/HDL Ratio: 5.9 {ratio} — ABNORMAL HIGH (ref 0.0–5.0)
Cholesterol, Total: 178 mg/dL (ref 100–199)
HDL: 30 mg/dL — ABNORMAL LOW (ref >39)
LDL Chol Calc (NIH): 98 mg/dL (ref 0–99)
Triglycerides: 292 mg/dL — ABNORMAL HIGH (ref 0–149)
VLDL Cholesterol Cal: 50 mg/dL — ABNORMAL HIGH (ref 5–40)

## 2023-05-07 ENCOUNTER — Other Ambulatory Visit: Payer: Self-pay

## 2023-05-07 ENCOUNTER — Observation Stay (HOSPITAL_COMMUNITY)
Admission: RE | Admit: 2023-05-07 | Discharge: 2023-05-08 | Disposition: A | Discharge status: Home / Self Care | Payer: Medicaid Other | Source: Ambulatory Visit | Attending: Urology | Admitting: Urology

## 2023-05-07 ENCOUNTER — Encounter (HOSPITAL_COMMUNITY)
Admission: RE | Disposition: A | Discharge status: Home / Self Care | Payer: Self-pay | Source: Ambulatory Visit | Attending: Urology

## 2023-05-07 ENCOUNTER — Encounter (HOSPITAL_COMMUNITY): Payer: Self-pay | Admitting: Urology

## 2023-05-07 ENCOUNTER — Ambulatory Visit (HOSPITAL_COMMUNITY): Payer: Medicaid Other | Admitting: Physician Assistant

## 2023-05-07 ENCOUNTER — Ambulatory Visit (HOSPITAL_BASED_OUTPATIENT_CLINIC_OR_DEPARTMENT_OTHER): Payer: Medicaid Other | Admitting: Anesthesiology

## 2023-05-07 DIAGNOSIS — Z01818 Encounter for other preprocedural examination: Secondary | ICD-10-CM

## 2023-05-07 DIAGNOSIS — Z87891 Personal history of nicotine dependence: Secondary | ICD-10-CM | POA: Diagnosis not present

## 2023-05-07 DIAGNOSIS — I110 Hypertensive heart disease with heart failure: Secondary | ICD-10-CM | POA: Insufficient documentation

## 2023-05-07 DIAGNOSIS — N2889 Other specified disorders of kidney and ureter: Secondary | ICD-10-CM

## 2023-05-07 DIAGNOSIS — I2510 Atherosclerotic heart disease of native coronary artery without angina pectoris: Secondary | ICD-10-CM | POA: Insufficient documentation

## 2023-05-07 DIAGNOSIS — E119 Type 2 diabetes mellitus without complications: Secondary | ICD-10-CM | POA: Insufficient documentation

## 2023-05-07 DIAGNOSIS — Z95810 Presence of automatic (implantable) cardiac defibrillator: Secondary | ICD-10-CM | POA: Diagnosis not present

## 2023-05-07 DIAGNOSIS — I5022 Chronic systolic (congestive) heart failure: Secondary | ICD-10-CM

## 2023-05-07 LAB — GLUCOSE, CAPILLARY
Glucose-Capillary: 181 mg/dL — ABNORMAL HIGH (ref 70–99)
Glucose-Capillary: 179 mg/dL — ABNORMAL HIGH (ref 70–99)
Glucose-Capillary: 211 mg/dL — ABNORMAL HIGH (ref 70–99)
Glucose-Capillary: 236 mg/dL — ABNORMAL HIGH (ref 70–99)

## 2023-05-07 LAB — HEMOGLOBIN AND HEMATOCRIT, BLOOD
HCT: 37.6 % — ABNORMAL LOW (ref 39.0–52.0)
Hemoglobin: 12.3 g/dL — ABNORMAL LOW (ref 13.0–17.0)

## 2023-05-07 LAB — TYPE AND SCREEN
ABO/RH(D): A POS
Antibody Screen: NEGATIVE

## 2023-05-07 SURGERY — NEPHRECTOMY, PARTIAL, ROBOT-ASSISTED, RETROPERITONEAL APPROACH
Anesthesia: General | Laterality: Right

## 2023-05-07 MED ORDER — KETAMINE HCL 50 MG/5ML IJ SOSY
PREFILLED_SYRINGE | INTRAMUSCULAR | Status: AC
  Filled 2023-05-07: qty 5

## 2023-05-07 MED ORDER — STERILE WATER FOR IRRIGATION IR SOLN
Status: DC | PRN
  Administered 2023-05-07: 1000 mL

## 2023-05-07 MED ORDER — DEXAMETHASONE SODIUM PHOSPHATE 4 MG/ML IJ SOLN
INTRAMUSCULAR | Status: DC | PRN
  Administered 2023-05-07: 5 mg via INTRAVENOUS

## 2023-05-07 MED ORDER — PHENYLEPHRINE HCL (PRESSORS) 10 MG/ML IV SOLN
INTRAVENOUS | Status: AC
  Filled 2023-05-07: qty 1

## 2023-05-07 MED ORDER — DIGOXIN 125 MCG PO TABS
0.1250 mg | ORAL_TABLET | Freq: Every day | ORAL | Status: DC
  Administered 2023-05-08: 0.125 mg via ORAL
  Filled 2023-05-07: qty 1

## 2023-05-07 MED ORDER — BUSPIRONE HCL 10 MG PO TABS: 20.0000 mg | ORAL_TABLET | Freq: Two times a day (BID) | ORAL | Status: DC

## 2023-05-07 MED ORDER — FENTANYL CITRATE (PF) 100 MCG/2ML IJ SOLN
INTRAMUSCULAR | Status: AC
  Filled 2023-05-07: qty 2

## 2023-05-07 MED ORDER — EZETIMIBE 10 MG PO TABS
10.0000 mg | ORAL_TABLET | Freq: Every day | ORAL | Status: DC
  Administered 2023-05-08: 10 mg via ORAL
  Filled 2023-05-07: qty 1

## 2023-05-07 MED ORDER — SODIUM CHLORIDE 0.9% FLUSH
INTRAVENOUS | Status: DC | PRN
  Administered 2023-05-07: 20 mL

## 2023-05-07 MED ORDER — SUGAMMADEX SODIUM 200 MG/2ML IV SOLN
INTRAVENOUS | Status: DC | PRN
  Administered 2023-05-07: 200 mg via INTRAVENOUS

## 2023-05-07 MED ORDER — ACETAMINOPHEN 10 MG/ML IV SOLN
1000.0000 mg | Freq: Four times a day (QID) | INTRAVENOUS | Status: AC
  Administered 2023-05-07 – 2023-05-08 (×4): 1000 mg via INTRAVENOUS
  Filled 2023-05-07 (×4): qty 100

## 2023-05-07 MED ORDER — EPHEDRINE SULFATE (PRESSORS) 50 MG/ML IJ SOLN
INTRAMUSCULAR | Status: DC | PRN
Start: 2023-05-07 — End: 2023-05-07
  Administered 2023-05-07: 5 mg via INTRAVENOUS
  Administered 2023-05-07: 10 mg via INTRAVENOUS

## 2023-05-07 MED ORDER — DOCUSATE SODIUM 100 MG PO CAPS: 100.0000 mg | ORAL_CAPSULE | Freq: Two times a day (BID) | ORAL | Status: DC

## 2023-05-07 MED ORDER — EZETIMIBE 10 MG PO TABS: 10.0000 mg | ORAL_TABLET | Freq: Every day | ORAL | Status: DC

## 2023-05-07 MED ORDER — ORAL CARE MOUTH RINSE: 15.0000 mL | Freq: Once | OROMUCOSAL | Status: AC

## 2023-05-07 MED ORDER — INSULIN ASPART 100 UNIT/ML IJ SOLN
0.0000 [IU] | Freq: Three times a day (TID) | INTRAMUSCULAR | Status: DC
  Administered 2023-05-08: 5 [IU] via SUBCUTANEOUS
  Administered 2023-05-08: 3 [IU] via SUBCUTANEOUS

## 2023-05-07 MED ORDER — MIDAZOLAM HCL 2 MG/2ML IJ SOLN
INTRAMUSCULAR | Status: AC
  Filled 2023-05-07: qty 2

## 2023-05-07 MED ORDER — AMISULPRIDE (ANTIEMETIC) 5 MG/2ML IV SOLN
INTRAVENOUS | Status: AC
  Administered 2023-05-07: 10 mg via INTRAVENOUS
  Filled 2023-05-07: qty 4

## 2023-05-07 MED ORDER — OXYCODONE HCL 5 MG PO TABS
ORAL_TABLET | ORAL | Status: AC
  Administered 2023-05-07: 5 mg via ORAL
  Filled 2023-05-07: qty 1

## 2023-05-07 MED ORDER — ONDANSETRON HCL 4 MG/2ML IJ SOLN: 4.0000 mg | INTRAMUSCULAR | Status: DC | PRN

## 2023-05-07 MED ORDER — ONDANSETRON HCL 4 MG/2ML IJ SOLN
INTRAMUSCULAR | Status: DC | PRN
  Administered 2023-05-07: 4 mg via INTRAVENOUS

## 2023-05-07 MED ORDER — HYDROMORPHONE HCL 1 MG/ML IJ SOLN
0.5000 mg | INTRAMUSCULAR | Status: DC | PRN
  Administered 2023-05-07 – 2023-05-08 (×2): 1 mg via INTRAVENOUS
  Filled 2023-05-07 (×2): qty 1

## 2023-05-07 MED ORDER — PHENYLEPHRINE HCL-NACL 20-0.9 MG/250ML-% IV SOLN
INTRAVENOUS | Status: DC | PRN
  Administered 2023-05-07: 25 ug/min via INTRAVENOUS

## 2023-05-07 MED ORDER — KETAMINE HCL 10 MG/ML IJ SOLN
INTRAMUSCULAR | Status: DC | PRN
  Administered 2023-05-07: 20 mg via INTRAVENOUS
  Administered 2023-05-07: 10 mg via INTRAVENOUS

## 2023-05-07 MED ORDER — PROPOFOL 10 MG/ML IV BOLUS
INTRAVENOUS | Status: DC | PRN
  Administered 2023-05-07: 200 mg via INTRAVENOUS

## 2023-05-07 MED ORDER — ONDANSETRON HCL 4 MG/2ML IJ SOLN
INTRAMUSCULAR | Status: AC
  Filled 2023-05-07: qty 2

## 2023-05-07 MED ORDER — ROCURONIUM BROMIDE 100 MG/10ML IV SOLN
INTRAVENOUS | Status: DC | PRN
  Administered 2023-05-07: 50 mg via INTRAVENOUS
  Administered 2023-05-07: 20 mg via INTRAVENOUS
  Administered 2023-05-07: 30 mg via INTRAVENOUS

## 2023-05-07 MED ORDER — INSULIN ASPART 100 UNIT/ML IJ SOLN
0.0000 [IU] | INTRAMUSCULAR | Status: DC | PRN
  Administered 2023-05-07: 4 [IU] via SUBCUTANEOUS
  Filled 2023-05-07: qty 1

## 2023-05-07 MED ORDER — PROPOFOL 10 MG/ML IV BOLUS
INTRAVENOUS | Status: AC
  Filled 2023-05-07: qty 20

## 2023-05-07 MED ORDER — INSULIN ASPART 100 UNIT/ML IJ SOLN
0.0000 [IU] | Freq: Every day | INTRAMUSCULAR | Status: DC
  Administered 2023-05-07: 2 [IU] via SUBCUTANEOUS

## 2023-05-07 MED ORDER — FENTANYL CITRATE PF 50 MCG/ML IJ SOSY
25.0000 ug | PREFILLED_SYRINGE | INTRAMUSCULAR | Status: DC | PRN
  Administered 2023-05-07: 50 ug via INTRAVENOUS

## 2023-05-07 MED ORDER — HYDROMORPHONE HCL 2 MG PO TABS: 2.0000 mg | ORAL_TABLET | Freq: Four times a day (QID) | ORAL | 0 refills | Status: AC | PRN

## 2023-05-07 MED ORDER — CHLORHEXIDINE GLUCONATE 0.12 % MT SOLN
15.0000 mL | Freq: Once | OROMUCOSAL | Status: AC
  Administered 2023-05-07: 15 mL via OROMUCOSAL

## 2023-05-07 MED ORDER — LIDOCAINE HCL (CARDIAC) PF 100 MG/5ML IV SOSY
PREFILLED_SYRINGE | INTRAVENOUS | Status: DC | PRN
  Administered 2023-05-07: 80 mg via INTRAVENOUS

## 2023-05-07 MED ORDER — DIGOXIN 125 MCG PO TABS: 125.0000 ug | ORAL_TABLET | Freq: Every day | ORAL | Status: DC

## 2023-05-07 MED ORDER — DOCUSATE SODIUM 100 MG PO CAPS
100.0000 mg | ORAL_CAPSULE | Freq: Two times a day (BID) | ORAL | Status: DC
  Administered 2023-05-07 – 2023-05-08 (×2): 100 mg via ORAL
  Filled 2023-05-07 (×2): qty 1

## 2023-05-07 MED ORDER — AMISULPRIDE (ANTIEMETIC) 5 MG/2ML IV SOLN: 10.0000 mg | Freq: Once | INTRAVENOUS | Status: AC | PRN

## 2023-05-07 MED ORDER — FENTANYL CITRATE PF 50 MCG/ML IJ SOSY
PREFILLED_SYRINGE | INTRAMUSCULAR | Status: AC
  Administered 2023-05-07: 50 ug via INTRAVENOUS
  Filled 2023-05-07: qty 1

## 2023-05-07 MED ORDER — LACTATED RINGERS IV SOLN
INTRAVENOUS | Status: DC
Start: 2023-05-07 — End: 2023-05-07

## 2023-05-07 MED ORDER — INSULIN ASPART 100 UNIT/ML IJ SOLN
4.0000 [IU] | Freq: Three times a day (TID) | INTRAMUSCULAR | Status: DC
  Administered 2023-05-08 (×2): 4 [IU] via SUBCUTANEOUS

## 2023-05-07 MED ORDER — BUPIVACAINE LIPOSOME 1.3 % IJ SUSP
INTRAMUSCULAR | Status: DC | PRN
  Administered 2023-05-07: 20 mL

## 2023-05-07 MED ORDER — SODIUM CHLORIDE (PF) 0.9 % IJ SOLN
INTRAMUSCULAR | Status: AC
Start: 2023-05-07 — End: ?
  Filled 2023-05-07: qty 20

## 2023-05-07 MED ORDER — BUSPIRONE HCL 10 MG PO TABS
20.0000 mg | ORAL_TABLET | Freq: Every day | ORAL | Status: DC
  Administered 2023-05-08: 20 mg via ORAL
  Filled 2023-05-07: qty 2

## 2023-05-07 MED ORDER — OXYCODONE HCL 5 MG PO TABS
5.0000 mg | ORAL_TABLET | ORAL | Status: DC | PRN
  Administered 2023-05-08: 5 mg via ORAL
  Filled 2023-05-07: qty 1

## 2023-05-07 MED ORDER — PHENYLEPHRINE HCL (PRESSORS) 10 MG/ML IV SOLN
INTRAVENOUS | Status: DC | PRN
  Administered 2023-05-07 (×5): 80 ug via INTRAVENOUS

## 2023-05-07 MED ORDER — VORTIOXETINE HBR 5 MG PO TABS: 20.0000 mg | ORAL_TABLET | Freq: Every day | ORAL | Status: DC

## 2023-05-07 MED ORDER — FENTANYL CITRATE (PF) 100 MCG/2ML IJ SOLN
INTRAMUSCULAR | Status: DC | PRN
  Administered 2023-05-07 (×2): 50 ug via INTRAVENOUS
  Administered 2023-05-07: 100 ug via INTRAVENOUS

## 2023-05-07 MED ORDER — DEXAMETHASONE SODIUM PHOSPHATE 10 MG/ML IJ SOLN
INTRAMUSCULAR | Status: AC
  Filled 2023-05-07: qty 1

## 2023-05-07 MED ORDER — VORTIOXETINE HBR 5 MG PO TABS
20.0000 mg | ORAL_TABLET | Freq: Every day | ORAL | Status: DC
  Administered 2023-05-08: 20 mg via ORAL
  Filled 2023-05-07: qty 4

## 2023-05-07 MED ORDER — LACTATED RINGERS IV SOLN: INTRAVENOUS | Status: DC | PRN

## 2023-05-07 MED ORDER — MIDAZOLAM HCL 5 MG/5ML IJ SOLN
INTRAMUSCULAR | Status: DC | PRN
  Administered 2023-05-07: 2 mg via INTRAVENOUS

## 2023-05-07 MED ORDER — BUPIVACAINE LIPOSOME 1.3 % IJ SUSP
INTRAMUSCULAR | Status: AC
  Filled 2023-05-07: qty 20

## 2023-05-07 MED ORDER — CEFAZOLIN SODIUM-DEXTROSE 2-4 GM/100ML-% IV SOLN
2.0000 g | Freq: Once | INTRAVENOUS | Status: AC
  Administered 2023-05-07: 2 g via INTRAVENOUS
  Filled 2023-05-07: qty 100

## 2023-05-07 MED ORDER — SODIUM CHLORIDE 0.9 % IV SOLN: INTRAVENOUS | Status: DC

## 2023-05-07 MED ORDER — FENTANYL CITRATE PF 50 MCG/ML IJ SOSY
PREFILLED_SYRINGE | INTRAMUSCULAR | Status: AC
  Administered 2023-05-07: 50 ug via INTRAVENOUS
  Filled 2023-05-07: qty 2

## 2023-05-07 SURGICAL SUPPLY — 66 items
APPLICATOR SURGIFLO ENDO (HEMOSTASIS) ×1 IMPLANT
BAG COUNTER SPONGE SURGICOUNT (BAG) IMPLANT
BAG LAPAROSCOPIC 12 15 PORT 16 (BASKET) IMPLANT
BAG RETRIEVAL 12/15 (BASKET) ×1
CHLORAPREP W/TINT 26 (MISCELLANEOUS) ×1 IMPLANT
CLIP LIGATING HEM O LOK PURPLE (MISCELLANEOUS) ×1 IMPLANT
CLIP LIGATING HEMO LOK XL GOLD (MISCELLANEOUS) IMPLANT
CLIP LIGATING HEMO O LOK GREEN (MISCELLANEOUS) ×2 IMPLANT
CLIP SUT LAPRA TY ABSORB (SUTURE) ×1 IMPLANT
COVER SURGICAL LIGHT HANDLE (MISCELLANEOUS) ×1 IMPLANT
COVER TIP SHEARS 8 DVNC (MISCELLANEOUS) ×1 IMPLANT
CUTTER ECHEON FLEX ENDO 45 340 (ENDOMECHANICALS) IMPLANT
DERMABOND ADVANCED .7 DNX12 (GAUZE/BANDAGES/DRESSINGS) ×1 IMPLANT
DISSECT BALLN SPACEMKR + OVL (BALLOONS) ×1
DISSECTOR BALLN SPACEMKR + OVL (BALLOONS) ×1 IMPLANT
DRAIN CHANNEL 15F RND FF 3/16 (WOUND CARE) ×1 IMPLANT
DRAPE ARM DVNC X/XI (DISPOSABLE) ×4 IMPLANT
DRAPE COLUMN DVNC XI (DISPOSABLE) ×1 IMPLANT
DRAPE INCISE IOBAN 66X45 STRL (DRAPES) ×1 IMPLANT
DRAPE SHEET LG 3/4 BI-LAMINATE (DRAPES) ×1 IMPLANT
DRIVER NDL LRG 8 DVNC XI (INSTRUMENTS) ×3 IMPLANT
DRIVER NDLE LRG 8 DVNC XI (INSTRUMENTS) ×2
ELECT PENCIL ROCKER SW 15FT (MISCELLANEOUS) ×1 IMPLANT
ELECT REM PT RETURN 15FT ADLT (MISCELLANEOUS) ×1 IMPLANT
EVACUATOR SILICONE 100CC (DRAIN) ×1 IMPLANT
FORCEPS BPLR FENES DVNC XI (FORCEP) ×1 IMPLANT
FORCEPS PROGRASP DVNC XI (FORCEP) ×1 IMPLANT
GLOVE BIO SURGEON STRL SZ 6.5 (GLOVE) ×1 IMPLANT
GLOVE SURG LX STRL 7.5 STRW (GLOVE) ×2 IMPLANT
GOWN STRL SURGICAL XL XLNG (GOWN DISPOSABLE) ×1 IMPLANT
HEMOSTAT SURGICEL 4X8 (HEMOSTASIS) ×1 IMPLANT
IRRIG SUCT STRYKERFLOW 2 WTIP (MISCELLANEOUS) ×1
IRRIGATION SUCT STRKRFLW 2 WTP (MISCELLANEOUS) ×1 IMPLANT
KIT BASIN OR (CUSTOM PROCEDURE TRAY) ×1 IMPLANT
KIT TURNOVER KIT A (KITS) IMPLANT
LOOP VESSEL MAXI BLUE (MISCELLANEOUS) ×1 IMPLANT
MARKER SKIN DUAL TIP RULER LAB (MISCELLANEOUS) ×1 IMPLANT
NS IRRIG 1000ML POUR BTL (IV SOLUTION) ×1 IMPLANT
PORT ACCESS TROCAR AIRSEAL 12 (TROCAR) ×1 IMPLANT
PROTECTOR NERVE ULNAR (MISCELLANEOUS) ×2 IMPLANT
RELOAD STAPLE 45 2.6 WHT THIN (STAPLE) IMPLANT
SCISSORS MNPLR CVD DVNC XI (INSTRUMENTS) ×1 IMPLANT
SEAL UNIV 5-12 XI (MISCELLANEOUS) ×4 IMPLANT
SET TRI-LUMEN FLTR TB AIRSEAL (TUBING) ×1 IMPLANT
SOL ELECTROSURG ANTI STICK (MISCELLANEOUS) ×1
SOLUTION ELECTROSURG ANTI STCK (MISCELLANEOUS) ×1 IMPLANT
SPIKE FLUID TRANSFER (MISCELLANEOUS) ×1 IMPLANT
SPONGE T-LAP 4X18 ~~LOC~~+RFID (SPONGE) ×1 IMPLANT
STAPLE RELOAD 45 WHT (STAPLE) ×1
SURGIFLO W/THROMBIN 8M KIT (HEMOSTASIS) ×1 IMPLANT
SUT ETHILON 3 0 PS 1 (SUTURE) ×1 IMPLANT
SUT MNCRL AB 4-0 PS2 18 (SUTURE) ×2 IMPLANT
SUT PDS AB 1 CT1 27 (SUTURE) ×2 IMPLANT
SUT V-LOC BARB 180 2/0GR6 GS22 (SUTURE)
SUT VIC AB 0 CT1 27XBRD ANTBC (SUTURE) ×4 IMPLANT
SUT VIC AB 2-0 SH 27X BRD (SUTURE) ×2 IMPLANT
SUT VLOC BARB 180 ABS3/0GR12 (SUTURE) ×1
SUTURE V-LC BRB 180 2/0GR6GS22 (SUTURE) IMPLANT
SUTURE VLOC BRB 180 ABS3/0GR12 (SUTURE) ×1 IMPLANT
SYS BAG RETRIEVAL 10MM (BASKET) ×1
SYSTEM BAG RETRIEVAL 10MM (BASKET) ×1 IMPLANT
TOWEL OR 17X26 10 PK STRL BLUE (TOWEL DISPOSABLE) ×1 IMPLANT
TRAY FOLEY MTR SLVR 16FR STAT (SET/KITS/TRAYS/PACK) ×1 IMPLANT
TRAY LAPAROSCOPIC (CUSTOM PROCEDURE TRAY) ×1 IMPLANT
TROCAR Z-THREAD OPTICAL 5X100M (TROCAR) IMPLANT
WATER STERILE IRR 1000ML POUR (IV SOLUTION) ×2 IMPLANT

## 2023-05-07 NOTE — Op Note (Signed)
 NAME: Greg Esparza, Greg Esparza MEDICAL RECORD NO: [id number] ACCOUNT NO: [id number] DATE OF BIRTH: Feb 16, 1966 FACILITY: [facility] LOCATION: [id number] PHYSICIAN: Ricardo Likens, MD  Operative Report   DATE OF PROCEDURE: 05/07/2023  PREOPERATIVE DIAGNOSIS:  Enlarging right renal mass.  PROCEDURE PERFORMED:  Robotic-assisted laparoscopic right retroperitoneal radical nephrectomy.  ESTIMATED BLOOD LOSS:  100 mL.  COMPLICATIONS:  None.  SPECIMENS:  Right radical nephrectomy to permanent pathology.  FINDINGS: 1. Single early branching artery, single vein, right renal vascular anatomy. 2. Very diffuse interface right renal mass, 100% endophytic.  INDICATIONS:  The patient is a 58 year old man with a history of right renal neoplasm times several years.  He has been very compliant with surveillance of this.  He has been found on serial imaging to have enlargement of the right renal mass now to a  size greater than 3 cm.  It is incredibly endophytic and very posterior.  He is diabetic.  Contralateral kidney is unremarkable.  I discussed management including continued surveillance versus ablative therapy versus surgical extirpation  and we  agreed on tentative plan of robotic partial versus radical nephrectomy.  He presents for this today.  Informed consent was obtained and placed in medical record.  He does have cardiac clearance on file.  DESCRIPTION OF PROCEDURE:  The patient was identified and verified.  Procedure being right robotic partial versus radical nephrectomy was confirmed.  Procedure timeout was performed.  Intravenous antibiotics administered.  General endotracheal anesthesia  induced.  The patient was placed into a right sided full flank position after Foley catheter was placed per urethra to straight drain.  We used approximately 10 degrees of table flexion given his history of lumbar spine surgery.  Care was taken to not  excessively flex.  The patient was further fastened to the operating table  using 3-inch tape with foam padding across the supraxiphoid chest and his pelvis.  A bean bag was deployed.  The sterile field was created, prepped and draped in the patient's  entire right flank and abdomen using chlorhexidine  gluconate.  The tip of the 12th rib was palpated as was the psoas musculature and iliac crest.  It did appear there would be sufficient room for retroperitoneal approach.  Incision was then made  approximately 2 cm in length just inferior to the 12th rib.  Dissection was carried down using a drill technique with surgeon's finger for the posterior subcutaneous tissue and muscle down to the level of the dorsal fascia, which was then pierced with  Kelly forceps and dilated.  Further finger dissection was performed into the retroperitoneal space, which was corroborated by palpation of the psoas musculature and upper pole of the kidney.  In this space between the psoas musculature and upper pole of  the kidney was further developed maximally with surgeon's finger.  Next, the retroperitoneal balloon dilation apparatus was carefully placed in the same location and under laparoscopic vision sequentially inflated to a pressure of 60 pumps in 20 pump  increments under continuous vision as well as an excellent development of the retroperitoneal space.  The retroperitoneal balloon was then removed and using palpation, digital technique, digital ports were placed as follows: Right intercostal 8-mm  robotic port between the 11th and 12th rib just at the lateral border of the psoas musculature.  Right medialized robotic port approximately 8 cm medial to the entry port, another 8 mm robotic port in a triangulated fashion superior to the entry port and  medial port by approximately 5 cm in each direction  and finally a 12-mm AirSeal assist port just inferomedial to the anterior port again just scattering across purposely the psoas musculature.  The retroperitoneal port was then placed across the entry   site with its in situ balloon inflated to 30 mL of air and a piggyback robotic port was placed through this.  Robot was docked and passed electronic checks.  Attention tendon was directed at the development of the retroperitoneum.  The psoas musculature  was used as a horizontal floor and fiber fatty tissue was carefully swept off of this developing the space posterior to the kidney.  The ureter was identified and placed on gentle anterior traction and dissection continued anteriorly.  The inferior vena  cava and hilar structures were then visualized.  Hilar consisted of a single early branching artery, one vein, renovascular anatomy as anticipated.  During mobilization of this, a small rent in the renal vein was noted.  This was only approximately 4 to  5 mm and fortunately at least 1.5 cm lateral to the area of the insertion of the inferior vena cava.  At this point, traction and pnumoperitoneum was sufficient for control of this.  Posterior plane was further developed superiorly towards the area of the adrenal gland,  which was visualized and towards the area of the interface of the mass within the kidney.  This area was carefully visualized and found to be incredibly diffuse in terms of its interface.  This finding along with the small rent in the vein, which could  be repaired if needed.  I felt necessitated the safest approach being conversion to radical nephrectomy.  This would safely deal with the tumor definitively and small rent in the vein.  As such, the right renal artery was controlled using a large  Hem-o-lok proximal, vascular stapler distal and the vein controlled using vascular stapler in an area just proximal to the small rent.  THis resulted in excellent control of the renal hilum.  The medial plane was then traced superiorly in a partial adrenal-sparing fashion towards the area of  the upper pole and inferiorly.  The ureter was once again visualized, doubly clipped, and ligated.  The  anterior plane was carefully developed from the lower pole towards the area of the upper pole, which completely mobilized the right kidney.  This was  able to be navigated into a medium-sized EndoCatch bag for later retrieval.  Hemostasis was excellent.  Sponge and needle counts were correct.  We achieved the goals of the extirpated portion of the procedure today.  The specimen was retrieved by  connecting the previous entry/camera port site with the incision port site.  Total distance was approximately 6 cm.  The specimen was retrieved via the site.  The extraction site was closed in the lower lumbar dorsal fascia using figure-of-eight PDS x6.   Following with the reapproximation of Scarpa's running Vicryl.  All incision sites were infiltrated with dilute lipolyzed Marcaine  and closed at the level of the skin using subcuticular Monocryl followed by Dermabond.  Procedure was then terminated.   The patient tolerated the procedure well.  No immediate periprocedural complications.  The patient was taken to postanesthesia care unit in a stable position.  Plan for observation admission.   PUS D: 05/07/2023 1:38:06 pm T: 05/07/2023 6:51:00 pm  JOB: [id number]/ [id number]

## 2023-05-07 NOTE — Discharge Instructions (Signed)

## 2023-05-07 NOTE — Brief Op Note (Signed)
 05/07/2023  1:27 PM  PATIENT:  Debby DELENA Mayer  58 y.o. male  PRE-OPERATIVE DIAGNOSIS:  RIGHT RENAL MASS  POST-OPERATIVE DIAGNOSIS:  RIGHT RENAL MASS  PROCEDURE:  Procedure(s) with comments: XI ROBOT ASSISTED RADICAL RETROPERITONEAL NEPHRECTOMY (Right) - 180 MINUTES NEEDED FOR CASE  SURGEON:  Surgeons and Role:    * Manny, Ricardo KATHEE Raddle., MD - Primary  PHYSICIAN ASSISTANT:   ASSISTANTS: none   ANESTHESIA:   local and general  EBL:  20 mL   BLOOD ADMINISTERED:none  DRAINS:  foley to gravity    LOCAL MEDICATIONS USED:  MARCAINE      SPECIMEN:  Source of Specimen:  Rt radical nephrectomy  DISPOSITION OF SPECIMEN:  PATHOLOGY  COUNTS:  YES  TOURNIQUET:  * No tourniquets in log *  DICTATION: .Other Dictation: Dictation Number [id number]  PLAN OF CARE: Admit for overnight observation  PATIENT DISPOSITION:  PACU - hemodynamically stable.   Delay start of Pharmacological VTE agent (>24hrs) due to surgical blood loss or risk of bleeding: yes

## 2023-05-07 NOTE — Anesthesia Postprocedure Evaluation (Signed)
 Anesthesia Post Note  Patient: Greg Esparza  Procedure(s) Performed: XI ROBOT ASSISTED RADICAL RETROPERITONEAL NEPHRECTOMY (Right)     Patient location during evaluation: PACU Anesthesia Type: General Level of consciousness: awake and alert Pain management: pain level controlled Vital Signs Assessment: post-procedure vital signs reviewed and stable Respiratory status: spontaneous breathing, nonlabored ventilation, respiratory function stable and patient connected to nasal cannula oxygen Cardiovascular status: blood pressure returned to baseline and stable Postop Assessment: no apparent nausea or vomiting Anesthetic complications: no  No notable events documented.  Last Vitals:  Vitals:   05/07/23 1615 05/07/23 1630  BP: (!) 90/56 109/61  Pulse: 70 77  Resp: 16 17  Temp:    SpO2: 98% 100%    Last Pain:  Vitals:   05/07/23 1605  TempSrc:   PainSc: 8                  Greg Esparza

## 2023-05-07 NOTE — Discharge Summary (Signed)
 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) 05/04/2023   Viral URI with cough 04/02/2023   Arthropathy 03/12/2023   Polydipsia 03/12/2023   Hypoglycemia associated with diabetes (HCC) 03/12/2023   Biventricular ICD (implantable cardioverter-defibrillator) in place 01/09/2019   Chronic systolic congestive heart failure (HCC) 11/30/2018   Coronary artery disease involving native heart 05/23/2016   High risk medication use 05/23/2016   Painful diabetic neuropathy (HCC) 08/26/2015   Plantar fasciitis 08/26/2015   Chronic pain syndrome 07/09/2015   Cardiomyopathy (HCC) 07/09/2015   Depression 07/09/2015   Diabetes (HCC) 07/09/2015   Herniated nucleus pulposus, L4-5 07/09/2015   Hypertension 07/09/2015   Low back pain 07/09/2015   DIAB W/OPHTH MANIFESTS TYPE I [JUV TYPE] UNCNTRL 03/22/2007   Anxiety 03/22/2007   Inflammatory and toxic neuropathy (HCC) 03/22/2007   INGROWING NAIL 03/22/2007    Procedures performed: Procedure(s): XI ROBOT ASSISTED RADICAL RETROPERITONEAL NEPHRECTOMY  History and Physical: For full details, please see admission history and physical. Briefly, Greg Esparza is a 58 y.o. year old patient with with a  right renal mass who underwent robotic retro nephrectomy.   Hospital Course: Patient tolerated the procedure well.  He was then transferred to the floor after an uneventful PACU stay.  His hospital course was uncomplicated.  On POD#1 he had met discharge criteria: was eating a regular diet, was up and ambulating independently,  pain was well controlled, was voiding without a catheter, and was ready to for discharge.   Laboratory values:  No  results for input(s): WBC, HGB, HCT in the last 72 hours. No results for input(s): NA, K, CL, CO2, GLUCOSE, BUN, CREATININE, CALCIUM in the last 72 hours. No results for input(s): LABPT, INR in the last 72 hours. No results for input(s): LABURIN in the last 72 hours. No results found for this or any previous visit.  Physical Exam  Gen: NAD Resp: Satting well on RA Card: Regular rate Abd: Soft, appropriately tender, ND, incision clean dry and intact GU: Voing spontaneously  Neuro: Alert   Disposition: Home  Discharge instruction: The patient was instructed to be ambulatory but told to refrain from heavy lifting, strenuous activity, or driving.   Discharge medications:  Allergies as of 05/07/2023       Reactions   Lipitor [atorvastatin] Other (See Comments)   Muscle pain   Oxymorphone Other (See Comments)   Urinary retention   Gabapentin Other (See Comments)   Lisinopril Cough   cough   Morphine Nausea And Vomiting   Penicillin G Rash        Medication List     TAKE these medications    amoxicillin -clavulanate 875-125 MG tablet Commonly known as: AUGMENTIN  Take 1 tablet by mouth 2 (two) times daily.   busPIRone  10 MG tablet Commonly known as: BUSPAR  TAKE 2 TABLETS BY MOUTH 2 TIMES DAILY.   digoxin  0.125 MG tablet Commonly known as: LANOXIN  TAKE 1 TABLET BY MOUTH EVERY DAY   docusate sodium  100 MG capsule Commonly known as: Colace Take 1 capsule (100 mg total) by mouth 2 (two) times daily.   esomeprazole 40 MG capsule Commonly known as: NEXIUM Take 40 mg by mouth daily as needed (acid  reflux).   ezetimibe  10 MG tablet Commonly known as: ZETIA  Take 1 tablet (10 mg total) by mouth daily.   FreeStyle Libre 3 Reader Devi 1 (ONE) EACH AS DIRECTED   FreeStyle Libre 3 Sensor Misc 1 each by Other route every 14 (fourteen) days.   Gvoke HypoPen  1-Pack 1 MG/0.2ML Soaj Generic drug: Glucagon  Inject 1 Pen into the skin as directed.    HYDROmorphone  2 MG tablet Commonly known as: Dilaudid  Take 1-2 tablets (2-4 mg total) by mouth every 6 (six) hours as needed for up to 5 days for severe pain (pain score 7-10) (post-operatively).   Jardiance  25 MG Tabs tablet Generic drug: empagliflozin  Take 1 tablet (25 mg total) by mouth daily. What changed: how much to take   Lantus  SoloStar 100 UNIT/ML Solostar Pen Generic drug: insulin  glargine Inject 40 Units into the skin daily.   linaclotide 290 MCG Caps capsule Commonly known as: LINZESS Take 290 mcg by mouth daily as needed (constipation).   mupirocin  ointment 2 % Commonly known as: BACTROBAN  Apply 1 Application topically daily as needed (wound care).   Narcan 4 MG/0.1ML Liqd nasal spray kit Generic drug: naloxone INSTILL 1 DOSE SQUIRTED IN NOSTRIL FOR EXCESSIVE SEDATION, REPEAT IN 3 MINUTES IF NO IMPROVEMENT   oxyCODONE  15 MG immediate release tablet Commonly known as: ROXICODONE  Take 15 mg by mouth every 4 (four) hours as needed for pain.   pregabalin  75 MG capsule Commonly known as: LYRICA  Take 75 mg by mouth 3 (three) times daily.   Repatha  SureClick 140 MG/ML Soaj Generic drug: Evolocumab  Inject 140 mg into the skin every 14 (fourteen) days.   Rybelsus  14 MG Tabs Generic drug: Semaglutide  Take 1 tablet (14 mg total) by mouth daily.   Trintellix  20 MG Tabs tablet Generic drug: vortioxetine  HBr Take 1 tablet (20 mg total) by mouth daily.   Vitamin D (Ergocalciferol) 1.25 MG (50000 UNIT) Caps capsule Commonly known as: DRISDOL Take 50,000 Units by mouth every Tuesday.        Followup:   Follow-up Information     Alvaro Ricardo KATHEE Raddle., MD Follow up on 05/24/2023.   Specialty: Urology Why: at 1:30 for MD visit and pathology review. Contact information: [address] [place] [address] [phone]

## 2023-05-07 NOTE — Transfer of Care (Signed)
 Immediate Anesthesia Transfer of Care Note  Patient: Greg Esparza  Procedure(s) Performed: XI ROBOT ASSISTED RADICAL RETROPERITONEAL NEPHRECTOMY (Right)  Patient Location: PACU  Anesthesia Type:General  Level of Consciousness: awake and alert   Airway & Oxygen Therapy: Patient Spontanous Breathing and Patient connected to face mask oxygen  Post-op Assessment: Report given to RN and Post -op Vital signs reviewed and stable  Post vital signs: Reviewed and stable  Last Vitals:  Vitals Value Taken Time  BP 126/70 05/07/23 1403  Temp    Pulse 72 05/07/23 1404  Resp 19 05/07/23 1404  SpO2 100 % 05/07/23 1404  Vitals shown include unfiled device data.  Last Pain:  Vitals:   05/07/23 0937  TempSrc:   PainSc: 8       Patients Stated Pain Goal: 6 (05/07/23 9062)  Complications: No notable events documented.

## 2023-05-07 NOTE — Anesthesia Procedure Notes (Signed)
 Procedure Name: Intubation Date/Time: 05/07/2023 12:09 PM  Performed by: Dartha Meckel, CRNAPre-anesthesia Checklist: Patient identified, Emergency Drugs available, Suction available and Patient being monitored Patient Re-evaluated:Patient Re-evaluated prior to induction Oxygen Delivery Method: Circle system utilized Preoxygenation: Pre-oxygenation with 100% oxygen Induction Type: IV induction Ventilation: Mask ventilation without difficulty Laryngoscope Size: Mac and 4 Grade View: Grade II Tube type: Oral Tube size: 7.5 mm Number of attempts: 1 Airway Equipment and Method: Stylet and Oral airway Placement Confirmation: ETT inserted through vocal cords under direct vision, positive ETCO2 and breath sounds checked- equal and bilateral Secured at: 22 cm Tube secured with: Tape Dental Injury: Teeth and Oropharynx as per pre-operative assessment

## 2023-05-07 NOTE — H&P (Signed)
 Greg Esparza is an 58 y.o. male.    Chief Complaint: Pre-OP RIGHT retroperitoneal robotic partial v. Radical nephrectomy  HPI:   1 - Enlarging Right Renal Mass - 2.3cm posterior mid 90% endophytic enhancing mass incidental on Er CT 03/2019. Mass is solitary. 1 artery (very early branch) / 1 vein right renovascular anatomy. He does have some significant comorbidity that is very stable.   Recent Surveillance:  11/2020 - CT - 2.6cm, minimal change, Cr 1.6  11/2021 - CT - 2.9cm, very slow progression, Cr 1.4  02/2023 - CT - 3.5cm, Cr 1.3   PMH sig for CAD/MI/CHF/Pacer (follows Dr. Meldon with cards in [facility]), DM2 with neuropathy (A1c 9s), Back surgery / chronic low dose narcotics (pain contract with Greg Esparza ). His PCP is with [facility]. He lost his wife of 32 years 03/2019 and has a special needs child, very strong grief reaction, improved after about 2 years.   Today  Greg Esparza  is seen to proceed with RIGHT robotic partial v. Radical nephrectomy. NO interavl fevers. Hgb 12.9, Cr 1.5, A1c 8s (stable for him). Cards clearance on file.    Past Medical History:  Diagnosis Date   AICD (automatic cardioverter/defibrillator) present    Anxiety    Arthritis    Cardiac defibrillator in situ    Cardiomyopathy (HCC)    Chronic lower back pain    Chronic pain syndrome    Chronic systolic (congestive) heart failure (HCC)    Depression    Diabetic neuropathy (HCC)    GERD (gastroesophageal reflux disease)    Myocardial infarction [facility])     Past Surgical History:  Procedure Laterality Date   BACK SURGERY     CARDIAC DEFIBRILLATOR PLACEMENT     In Situ    Family History  Problem Relation Age of Onset   Heart failure Mother    Diabetes Mother    Diabetes Sister    Diabetes Brother    Diabetes Brother    Heart failure Maternal Grandmother    Diabetes Maternal Grandmother    Heart failure Maternal Grandfather    Diabetes Maternal Grandfather    Cancer Paternal Grandmother     Social History:  reports that he has quit smoking. His smoking use included cigarettes. He has never used smokeless tobacco. He reports that he does not drink alcohol and does not use drugs.  Allergies:  Allergies  Allergen Reactions   Lipitor [Atorvastatin] Other (See Comments)    Muscle pain   Oxymorphone Other (See Comments)    Urinary retention   Gabapentin Other (See Comments)   Lisinopril Cough    cough   Morphine Nausea And Vomiting   Penicillin G Rash    No medications prior to admission.    No results found for this or any previous visit (from the past 48 hours). No results found.  Review of Systems  Constitutional:  Negative for chills and fever.  All other systems reviewed and are negative.   There were no vitals taken for this visit. Physical Exam Vitals reviewed.  HENT:     Head: Normocephalic.  Eyes:     Pupils: Pupils are equal, round, and reactive to light.  Cardiovascular:     Rate and Rhythm: Normal rate.  Abdominal:     General: Abdomen is flat.  Genitourinary:    Comments: No CVAT at present Musculoskeletal:        General: Normal range of motion.     Cervical back: Normal range of  motion.  Skin:    General: Skin is warm.  Neurological:     General: No focal deficit present.     Mental Status: He is alert.  Psychiatric:        Mood and Affect: Mood normal.      Assessment/Plan  Proceed as planned with RIGHT robotic partial v. Radical nephrectomy. Risks, benefits, alternatives, expected peri-op course discussed previously and reiterated today. He understands that his metabolic and CV comorbidity increases risks of ALL peri-op complicaitons including infection, MI, mortality.   Greg Esparza Greg Esparza Greg Esparza Greg Esparza., MD 05/07/2023, 6:50 AM

## 2023-05-08 DIAGNOSIS — N2889 Other specified disorders of kidney and ureter: Secondary | ICD-10-CM | POA: Diagnosis not present

## 2023-05-08 LAB — BASIC METABOLIC PANEL
Anion gap: 10 (ref 5–15)
BUN: 28 mg/dL — ABNORMAL HIGH (ref 6–20)
CO2: 21 mmol/L — ABNORMAL LOW (ref 22–32)
Calcium: 8.5 mg/dL — ABNORMAL LOW (ref 8.9–10.3)
Chloride: 102 mmol/L (ref 98–111)
Creatinine, Ser: 2.34 mg/dL — ABNORMAL HIGH (ref 0.61–1.24)
GFR, Estimated: 32 mL/min — ABNORMAL LOW (ref >60)
Glucose, Bld: 229 mg/dL — ABNORMAL HIGH (ref 70–99)
Potassium: 4.8 mmol/L (ref 3.5–5.1)
Sodium: 133 mmol/L — ABNORMAL LOW (ref 135–145)

## 2023-05-08 LAB — GLUCOSE, CAPILLARY
Glucose-Capillary: 219 mg/dL — ABNORMAL HIGH (ref 70–99)
Glucose-Capillary: 208 mg/dL — ABNORMAL HIGH (ref 70–99)
Glucose-Capillary: 165 mg/dL — ABNORMAL HIGH (ref 70–99)

## 2023-05-08 LAB — HEMOGLOBIN AND HEMATOCRIT, BLOOD
HCT: 35.7 % — ABNORMAL LOW (ref 39.0–52.0)
Hemoglobin: 11.9 g/dL — ABNORMAL LOW (ref 13.0–17.0)

## 2023-05-08 NOTE — Progress Notes (Signed)
 Urology Inpatient Progress Report  Right renal mass [N28.89]  Procedure(s): XI ROBOT ASSISTED RADICAL RETROPERITONEAL NEPHRECTOMY  1 Day Post-Op   Intv/Subj: No overnight events.  Plus flatus and ambulation.  Patient is hungry would like real food.  He has right flank burning.  No nausea or vomiting.  Principal Problem:   Right renal mass  Current Facility-Administered Medications  Medication Dose Route Frequency Provider Last Rate Last Admin   0.9 %  sodium chloride  infusion   Intravenous Continuous Matthew-Onabanjo, Asia, MD 75 mL/hr at 05/07/23 1838 Infusion Verify at 05/07/23 8161   acetaminophen  (OFIRMEV ) IV 1,000 mg  1,000 mg Intravenous Q6H Matthew-Onabanjo, Asia, MD 400 mL/hr at 05/08/23 0023 1,000 mg at 05/08/23 0023   busPIRone  (BUSPAR ) tablet 20 mg  20 mg Oral Daily Matthew-Onabanjo, Asia, MD       digoxin  (LANOXIN ) tablet 0.125 mg  0.125 mg Oral Daily Matthew-Onabanjo, Asia, MD       docusate sodium  (COLACE) capsule 100 mg  100 mg Oral BID Matthew-Onabanjo, Asia, MD   100 mg at 05/07/23 2117   ezetimibe  (ZETIA ) tablet 10 mg  10 mg Oral Daily Matthew-Onabanjo, Asia, MD       HYDROmorphone  (DILAUDID ) injection 0.5-1 mg  0.5-1 mg Intravenous Q2H PRN Matthew-Onabanjo, Asia, MD   1 mg at 05/08/23 0404   insulin  aspart (novoLOG ) injection 0-15 Units  0-15 Units Subcutaneous TID WC Matthew-Onabanjo, Asia, MD       insulin  aspart (novoLOG ) injection 0-5 Units  0-5 Units Subcutaneous QHS Matthew-Onabanjo, Asia, MD   2 Units at 05/07/23 2116   insulin  aspart (novoLOG ) injection 4 Units  4 Units Subcutaneous TID WC Matthew-Onabanjo, Asia, MD       ondansetron  (ZOFRAN ) injection 4 mg  4 mg Intravenous Q4H PRN Matthew-Onabanjo, Asia, MD       oxyCODONE  (Oxy IR/ROXICODONE ) immediate release tablet 5 mg  5 mg Oral Q4H PRN Matthew-Onabanjo, Asia, MD   5 mg at 05/07/23 1630   vortioxetine  HBr (TRINTELLIX ) tablet 20 mg  20 mg Oral Daily Matthew-Onabanjo, Asia, MD       Facility-Administered  Medications Ordered in Other Encounters  Medication Dose Route Frequency Provider Last Rate Last Admin   magnesium  citrate solution 1 Bottle  1 Bottle Oral Once Manny, Theodore B Jr., MD         Objective: Vital: Vitals:   05/08/23 0100 05/08/23 0421 05/08/23 0430 05/08/23 0434  BP: (!) 91/57 (!) 88/55 (!) 85/63 (!) 88/58  Pulse: 79 84 84   Resp:      Temp: 98.4 F (36.9 C) (!) 97.4 F (36.3 C)    TempSrc: Oral Oral    SpO2: 94% 97% 97%   Weight:      Height:       I/Os: I/O last 3 completed shifts: In: 1857.6 [I.V.:1657.6; IV Piggyback:200] Out: 395 [Urine:375; Blood:20]  Physical Exam:  General: Patient is in no apparent distress Lungs: Normal respiratory effort, chest expands symmetrically. GI: Incisions are c/d/i.  The abdomen is soft and appropriately tender without mass. Foley: draining clear yellow urine  Ext: lower extremities symmetric  Lab Results: Recent Labs    05/07/23 1424 05/08/23 0456  HGB 12.3* 11.9*  HCT 37.6* 35.7*   Cr 2.34 POD1 (1.5 preop)  Assessment: Right renal mass POD1 s/p retroperitoneal robotic radical nephrectomy doing well.  Plan: -Advance diet as tolerated -Out of bed and ambulate -DC Foley -Home meds -discussed that renal function will initially worsen and should come down to new  baseline -anticipate dc later today vs tomorrow AM   Valli Shank, MD Urology 05/08/2023, 5:53 AM

## 2023-05-08 NOTE — Progress Notes (Signed)
 Patient received discharge orders to go home. Patient was given discharge instructions/paperwork. Another RN went over discharge paperwork/instructions with the patient and patient's significant other. All questions/concerns were addressed/answered during that time. Patient left the hospital stable, had discharge paperwork/instructions, and had all personal belongings.

## 2023-05-08 NOTE — Plan of Care (Signed)
 Problem: Education: Goal: Knowledge of General Education information will improve Description: Including pain rating scale, medication(s)/side effects and non-pharmacologic comfort measures Outcome: Completed/Met   Problem: Health Behavior/Discharge Planning: Goal: Ability to manage health-related needs will improve Outcome: Completed/Met   Problem: Clinical Measurements: Goal: Ability to maintain clinical measurements within normal limits will improve Outcome: Completed/Met Goal: Will remain free from infection Outcome: Completed/Met Goal: Diagnostic test results will improve Outcome: Completed/Met Goal: Respiratory complications will improve Outcome: Completed/Met Goal: Cardiovascular complication will be avoided Outcome: Completed/Met   Problem: Activity: Goal: Risk for activity intolerance will decrease Outcome: Completed/Met   Problem: Nutrition: Goal: Adequate nutrition will be maintained Outcome: Completed/Met   Problem: Coping: Goal: Level of anxiety will decrease Outcome: Completed/Met   Problem: Elimination: Goal: Will not experience complications related to bowel motility Outcome: Completed/Met Goal: Will not experience complications related to urinary retention Outcome: Completed/Met   Problem: Pain Management: Goal: General experience of comfort will improve Outcome: Completed/Met   Problem: Safety: Goal: Ability to remain free from injury will improve Outcome: Completed/Met   Problem: Skin Integrity: Goal: Risk for impaired skin integrity will decrease Outcome: Completed/Met   Problem: Education: Goal: Ability to describe self-care measures that may prevent or decrease complications (Diabetes Survival Skills Education) will improve Outcome: Completed/Met Goal: Individualized Educational Video(s) Outcome: Completed/Met   Problem: Coping: Goal: Ability to adjust to condition or change in health will improve Outcome: Completed/Met   Problem:  Fluid Volume: Goal: Ability to maintain a balanced intake and output will improve Outcome: Completed/Met   Problem: Health Behavior/Discharge Planning: Goal: Ability to identify and utilize available resources and services will improve Outcome: Completed/Met Goal: Ability to manage health-related needs will improve Outcome: Completed/Met   Problem: Metabolic: Goal: Ability to maintain appropriate glucose levels will improve Outcome: Completed/Met   Problem: Nutritional: Goal: Maintenance of adequate nutrition will improve Outcome: Completed/Met Goal: Progress toward achieving an optimal weight will improve Outcome: Completed/Met   Problem: Skin Integrity: Goal: Risk for impaired skin integrity will decrease Outcome: Completed/Met   Problem: Tissue Perfusion: Goal: Adequacy of tissue perfusion will improve Outcome: Completed/Met   Problem: Education: Goal: Ability to describe self-care measures that may prevent or decrease complications (Diabetes Survival Skills Education) will improve Outcome: Completed/Met Goal: Individualized Educational Video(s) Outcome: Completed/Met   Problem: Coping: Goal: Ability to adjust to condition or change in health will improve Outcome: Completed/Met   Problem: Fluid Volume: Goal: Ability to maintain a balanced intake and output will improve Outcome: Completed/Met   Problem: Health Behavior/Discharge Planning: Goal: Ability to identify and utilize available resources and services will improve Outcome: Completed/Met Goal: Ability to manage health-related needs will improve Outcome: Completed/Met   Problem: Metabolic: Goal: Ability to maintain appropriate glucose levels will improve Outcome: Completed/Met   Problem: Nutritional: Goal: Maintenance of adequate nutrition will improve Outcome: Completed/Met Goal: Progress toward achieving an optimal weight will improve Outcome: Completed/Met   Problem: Skin Integrity: Goal: Risk for  impaired skin integrity will decrease Outcome: Completed/Met   Problem: Tissue Perfusion: Goal: Adequacy of tissue perfusion will improve Outcome: Completed/Met   Problem: Education: Goal: Knowledge of the prescribed therapeutic regimen will improve Outcome: Completed/Met   Problem: Bowel/Gastric: Goal: Gastrointestinal status for postoperative course will improve Outcome: Completed/Met   Problem: Clinical Measurements: Goal: Postoperative complications will be avoided or minimized Outcome: Completed/Met   Problem: Respiratory: Goal: Ability to achieve and maintain a regular respiratory rate will improve Outcome: Completed/Met   Problem: Skin Integrity: Goal: Demonstration of wound healing without infection will  improve Outcome: Completed/Met   Problem: Urinary Elimination: Goal: Ability to avoid or minimize complications of infection will improve Outcome: Completed/Met Goal: Ability to achieve and maintain urine output will improve Outcome: Completed/Met   Problem: Education: Goal: Knowledge of the prescribed therapeutic regimen will improve Outcome: Completed/Met   Problem: Bowel/Gastric: Goal: Gastrointestinal status for postoperative course will improve Outcome: Completed/Met   Problem: Clinical Measurements: Goal: Postoperative complications will be avoided or minimized Outcome: Completed/Met   Problem: Respiratory: Goal: Ability to achieve and maintain a regular respiratory rate will improve Outcome: Completed/Met   Problem: Skin Integrity: Goal: Demonstration of wound healing without infection will improve Outcome: Completed/Met   Problem: Urinary Elimination: Goal: Ability to avoid or minimize complications of infection will improve Outcome: Completed/Met Goal: Ability to achieve and maintain urine output will improve Outcome: Completed/Met

## 2023-05-11 ENCOUNTER — Ambulatory Visit: Payer: Medicaid Other | Admitting: Physician Assistant

## 2023-05-11 LAB — SURGICAL PATHOLOGY

## 2023-05-13 ENCOUNTER — Telehealth: Payer: Self-pay

## 2023-05-13 NOTE — Telephone Encounter (Signed)
Called patient made him aware he is suppose to take both medication Repatha and Lantus.

## 2023-05-13 NOTE — Telephone Encounter (Signed)
Copied from CRM [id number]. Topic: Clinical - Medication Question >> May 13, 2023  2:12 PM Prudencio Pair wrote: Reason for CRM: Patient is calling stating that he finally got the approval for the Repatha Sureclick 140 mg. He wants to know if Dr. [name] wants him to take the Repatha & Lantus too? Please give a call back to patient to advise. CB#: [id number].

## 2023-05-24 ENCOUNTER — Ambulatory Visit: Payer: Medicaid Other | Admitting: Physician Assistant

## 2023-05-25 ENCOUNTER — Ambulatory Visit: Payer: Medicaid Other | Admitting: Physician Assistant

## 2023-05-25 NOTE — Op Note (Signed)
NAME: Greg Esparza, Greg Esparza MEDICAL RECORD NO: [id number] ACCOUNT NO: [id number] DATE OF BIRTH: Aug 23, 1965 FACILITY: [facility]ATION: [id number] PHYSICIAN: Sebastian Ache, MD   Operative Report    DATE OF PROCEDURE: 05/07/2023   PREOPERATIVE DIAGNOSIS:  Enlarging right renal mass.   PROCEDURE PERFORMED:  Robotic-assisted laparoscopic right retroperitoneal radical nephrectomy.   ESTIMATED BLOOD LOSS:  100 mL.   COMPLICATIONS:  None.   ASSISTANT: Harrie Foreman PA  SPECIMENS:  Right radical nephrectomy to permanent pathology.   FINDINGS: 1. Single early branching artery, single vein, right renal vascular anatomy. 2. Very diffuse interface right renal mass, 100% endophytic.   INDICATIONS:  The patient is a 58 year old man with a history of right renal neoplasm times several years.  He has been very compliant with surveillance of this.  He has been found on serial imaging to have enlargement of the right renal mass now to a  size greater than 3 cm.  It is incredibly endophytic and very posterior.  He is diabetic.  Contralateral kidney is unremarkable.  I discussed management including continued surveillance versus ablative therapy versus surgical extirpation  and we  agreed on tentative plan of robotic partial versus radical nephrectomy.  He presents for this today.  Informed consent was obtained and placed in medical record.  He does have cardiac clearance on file.   DESCRIPTION OF PROCEDURE:  The patient was identified and verified.  Procedure being right robotic partial versus radical nephrectomy was confirmed.  Procedure timeout was performed.  Intravenous antibiotics administered.  General endotracheal anesthesia  induced.  The patient was placed into a right sided full flank position after Foley catheter was placed per urethra to straight drain.  We used approximately 10 degrees of table flexion given his history of lumbar spine surgery.  Care was taken to not  excessively flex.  The patient was  further fastened to the operating table using 3-inch tape with foam padding across the supraxiphoid chest and his pelvis.  A bean bag was deployed.  The sterile field was created, prepped and draped in the patient's  entire right flank and abdomen using chlorhexidine gluconate.  The tip of the 12th rib was palpated as was the psoas musculature and iliac crest.  It did appear there would be sufficient room for retroperitoneal approach.  Incision was then made  approximately 2 cm in length just inferior to the 12th rib.  Dissection was carried down using a drill technique with surgeon's finger for the posterior subcutaneous tissue and muscle down to the level of the dorsal fascia, which was then pierced with  Kelly forceps and dilated.  Further finger dissection was performed into the retroperitoneal space, which was corroborated by palpation of the psoas musculature and upper pole of the kidney.  In this space between the psoas musculature and upper pole of  the kidney was further developed maximally with surgeon's finger.  Next, the retroperitoneal balloon dilation apparatus was carefully placed in the same location and under laparoscopic vision sequentially inflated to a pressure of 60 pumps in 20 pump  increments under continuous vision as well as an excellent development of the retroperitoneal space.  The retroperitoneal balloon was then removed and using palpation, digital technique, digital ports were placed as follows: Right intercostal 8-mm  robotic port between the 11th and 12th rib just at the lateral border of the psoas musculature.  Right medialized robotic port approximately 8 cm medial to the entry port, another 8 mm robotic port in a triangulated fashion superior  to the entry port and  medial port by approximately 5 cm in each direction and finally a 12-mm AirSeal assist port just inferomedial to the anterior port again just scattering across purposely the psoas musculature.  The retroperitoneal  port was then placed across the entry  site with its in situ balloon inflated to 30 mL of air and a piggyback robotic port was placed through this.  Robot was docked and passed electronic checks.  Attention tendon was directed at the development of the retroperitoneum.  The psoas musculature  was used as a horizontal floor and fiber fatty tissue was carefully swept off of this developing the space posterior to the kidney.  The ureter was identified and placed on gentle anterior traction and dissection continued anteriorly.  The inferior vena  cava and hilar structures were then visualized.  Hilar consisted of a single early branching artery, one vein, renovascular anatomy as anticipated.  During mobilization of this, a small rent in the renal vein was noted.  This was only approximately 4 to  5 mm and fortunately at least 1.5 cm lateral to the area of the insertion of the inferior vena cava.  At this point, traction and pnumoperitoneum was sufficient for control of this.  Posterior plane was further developed superiorly towards the area of the adrenal gland,  which was visualized and towards the area of the interface of the mass within the kidney.  This area was carefully visualized and found to be incredibly diffuse in terms of its interface.  This finding along with the small rent in the vein, which could  be repaired if needed.  I felt necessitated the safest approach being conversion to radical nephrectomy.  This would safely deal with the tumor definitively and small rent in the vein.  As such, the right renal artery was controlled using a large  Hem-o-lok proximal, vascular stapler distal and the vein controlled using vascular stapler in an area just proximal to the small rent.  THis resulted in excellent control of the renal hilum.  The medial plane was then traced superiorly in a partial adrenal-sparing fashion towards the area of  the upper pole and inferiorly.  The ureter was once again visualized,  doubly clipped, and ligated.  The anterior plane was carefully developed from the lower pole towards the area of the upper pole, which completely mobilized the right kidney.  This was  able to be navigated into a medium-sized EndoCatch bag for later retrieval.  Hemostasis was excellent.  Sponge and needle counts were correct.  We achieved the goals of the extirpated portion of the procedure today.  The specimen was retrieved by  connecting the previous entry/camera port site with the incision port site.  Total distance was approximately 6 cm.  The specimen was retrieved via the site.  The extraction site was closed in the lower lumbar dorsal fascia using figure-of-eight PDS x6.   Following with the reapproximation of Scarpa's running Vicryl.  All incision sites were infiltrated with dilute lipolyzed Marcaine and closed at the level of the skin using subcuticular Monocryl followed by Dermabond.  Procedure was then terminated.   The patient tolerated the procedure well.  No immediate periprocedural complications.  The patient was taken to postanesthesia care unit in a stable position.  Plan for observation admission.  Please note first assistant Harrie Foreman was crucial for all portions of surgery. She provided retraction, suctioning, vascular clipping, vascular stapling, robotic instrument exchange, and general first assistance.

## 2023-06-03 ENCOUNTER — Other Ambulatory Visit: Payer: Self-pay | Admitting: Physician Assistant

## 2023-06-03 DIAGNOSIS — I2510 Atherosclerotic heart disease of native coronary artery without angina pectoris: Secondary | ICD-10-CM

## 2023-06-08 NOTE — Progress Notes (Signed)
 This encounter was created in error - please disregard.

## 2023-06-15 ENCOUNTER — Encounter: Payer: Self-pay | Admitting: Physician Assistant

## 2023-06-15 ENCOUNTER — Telehealth: Payer: Self-pay

## 2023-06-15 DIAGNOSIS — E1165 Type 2 diabetes mellitus with hyperglycemia: Secondary | ICD-10-CM

## 2023-06-15 MED ORDER — BLOOD GLUCOSE MONITORING SUPPL DEVI: 1.0000 | Freq: Three times a day (TID) | 0 refills | Status: AC

## 2023-06-15 MED ORDER — LANCETS MISC. MISC: 1.0000 | Freq: Three times a day (TID) | 0 refills | Status: AC

## 2023-06-15 MED ORDER — BLOOD GLUCOSE TEST VI STRP
100.0000 | ORAL_STRIP | Freq: Three times a day (TID) | 0 refills | Status: AC
Start: 2023-06-15 — End: 2023-07-15

## 2023-06-15 MED ORDER — LANCET DEVICE MISC: 1.0000 | Freq: Three times a day (TID) | 0 refills | Status: AC

## 2023-06-15 NOTE — Telephone Encounter (Signed)
Patient called and wanted to know status of Prior Authorization for libre 3.  Called patient back made him aware I called and spoke to someone from [facility] which is his insurance and I faxed over supporting documents showing his A1C has drop while he has been using the [place], she made me aware it would take 5 days for them to response with a answer.   Called patient made him aware, nut sent him in a glucose monitor until we heard back from his insurance about the [place].

## 2023-06-22 ENCOUNTER — Encounter: Payer: Self-pay | Admitting: Physician Assistant

## 2023-06-22 ENCOUNTER — Ambulatory Visit: Payer: Medicaid Other | Admitting: Physician Assistant

## 2023-06-22 VITALS — BP 90/50 | HR 80 | Temp 97.8°F | Ht 68.0 in | Wt 177.0 lb

## 2023-06-22 DIAGNOSIS — Z794 Long term (current) use of insulin: Secondary | ICD-10-CM

## 2023-06-22 DIAGNOSIS — I959 Hypotension, unspecified: Secondary | ICD-10-CM | POA: Insufficient documentation

## 2023-06-22 DIAGNOSIS — I5022 Chronic systolic (congestive) heart failure: Secondary | ICD-10-CM | POA: Diagnosis not present

## 2023-06-22 DIAGNOSIS — E1165 Type 2 diabetes mellitus with hyperglycemia: Secondary | ICD-10-CM

## 2023-06-22 DIAGNOSIS — R5382 Chronic fatigue, unspecified: Secondary | ICD-10-CM

## 2023-06-22 DIAGNOSIS — E1140 Type 2 diabetes mellitus with diabetic neuropathy, unspecified: Secondary | ICD-10-CM | POA: Diagnosis not present

## 2023-06-22 DIAGNOSIS — I951 Orthostatic hypotension: Secondary | ICD-10-CM

## 2023-06-22 NOTE — Assessment & Plan Note (Signed)
 Recent blood glucose of 216 an hour post-meal. Patient on Lantus and NovoLog insulin, as well as Jardiance and Rybelsus. Recent adjustment in insulin dosing due to high afternoon glucose levels. -Adjust Lantus dosing to 25 units in the morning and 15 units before dinner. -Monitor blood glucose levels and report to provider in one week for potential further adjustments. -Order A1c to assess overall glycemic control.

## 2023-06-22 NOTE — Patient Instructions (Signed)
 VISIT SUMMARY:  Today, we discussed your concerns about poor blood sugar control, low blood pressure, and other symptoms you have been experiencing. We reviewed your current medications and made some adjustments to help manage your conditions more effectively. We also ordered some tests to better understand your health status and referred you to a cardiologist for further evaluation of your heart condition.  YOUR PLAN:  -HYPOTENSION: Hypotension means low blood pressure, which can cause dizziness and lightheadedness, especially when standing up quickly. We recommend increasing your fluid intake, monitoring your blood pressure regularly, and seeking emergency care if your blood pressure drops too low. We have also ordered some blood tests to check for potential causes of your low blood pressure.  -DIABETES MELLITUS: Diabetes Mellitus is a condition where your blood sugar levels are too high. We have adjusted your Lantus insulin dose to 25 units in the morning and 15 units before dinner. Please monitor your blood sugar levels and report back in one week. We have also ordered an A1c test to assess your overall blood sugar control.  -NEUROPATHY: Neuropathy is nerve damage that can cause pain and numbness, often in the feet. You are currently taking Lyrica for this condition. Continue with your current dose and monitor for any changes in your symptoms.  -CARDIAC HISTORY: Given your history of heart issues, including non-ischemic cardiomyopathy and atrial fibrillation, we have ordered a ProBNP test to check for potential heart failure. We have also referred you to a cardiologist for further evaluation and a possible echocardiogram.  -CHRONIC PAIN: Chronic pain is long-lasting pain that can affect your daily life. You are taking oxycodone as needed for your back pain. Please monitor your blood pressure when taking this medication, as it can contribute to low blood pressure.  INSTRUCTIONS:  Please follow  up in 3 months to monitor your blood glucose control, blood pressure, and overall health status. Additionally, report your blood sugar levels to [place] in one week for potential further adjustments.

## 2023-06-22 NOTE — Assessment & Plan Note (Signed)
 Patient on Lyrica for neuropathy, recently reduced from 150mg  to 75mg  due to side effects. -Continue Lyrica at current dose and monitor for improvement in neuropathy symptoms.

## 2023-06-22 NOTE — Progress Notes (Signed)
 Subjective:  Patient ID: Greg Esparza, male    DOB: 1965-05-05  Age: 58 y.o. MRN: [id number]  Chief Complaint  Patient presents with   Diabetes      Discussed the use of AI scribe software for clinical note transcription with the patient, who gave verbal consent to proceed.  History of Present Illness   Greg Esparza, a patient with a history of diabetes, heart arrhythmias, chronic back pain, and recent kidney removal due to cancer, presents with concerns about poor sugar control. He reports feeling fatigued, experiencing dizziness and blurry vision, particularly when standing up quickly. He also mentions having low blood pressure.  In addition to these symptoms, Greg Esparza has been dealing with frequent urination and dehydration, which he attributes to his diabetes medication, Jardiance. He has been halving his dose to manage these side effects. He also takes Rybelsus for his diabetes.  Greg Esparza uses a Libre sensor to monitor his blood sugar levels. He reports that his sugar levels tend to spike in the afternoons and evenings, and he has been adjusting his insulin dosage in an attempt to better control his sugar levels. He takes 40 units of Lantus insulin in the morning, and has recently started taking an additional 10 units in the evening if his sugar levels spike.  He also mentions experiencing a dry cough and sweating excessively when he eats, regardless of what he consumes. He has discussed these symptoms with several doctors but has not found a solution.  Greg Esparza also suffers from neuropathy, for which he takes Lyrica. He reports that his feet often feel like they are "over the top of a fire." He has been taking a lower dose of Lyrica due to excessive sleepiness, a known side effect of the medication.  He also takes oxycodone for chronic back pain, but only as needed, usually at night. He mentions that he has been experiencing dark puffiness around his eyes, which he has not noticed before.               05/04/2023   10:24 AM 02/03/2023    3:16 PM  Depression screen PHQ 2/9  Decreased Interest 1 1  Down, Depressed, Hopeless 0 2  PHQ - 2 Score 1 3  Altered sleeping 0 0  Tired, decreased energy 2 3  Change in appetite 0 3  Feeling bad or failure about yourself  0 0  Trouble concentrating 2 1  Moving slowly or fidgety/restless 1 0  Suicidal thoughts 0 0  PHQ-9 Score 6 10  Difficult doing work/chores Somewhat difficult Extremely dIfficult        05/04/2023   10:24 AM  Fall Risk   Falls in the past year? 0  Number falls in past yr: 0  Injury with Fall? 0  Risk for fall due to : No Fall Risks  Follow up Falls evaluation completed    Patient Care Team: [name], [place] as PCP - General (Physician Assistant) Iven Finn., MD as Referring Physician (Neurology) Sandy Salaam, MD as Referring Physician (Cardiology) Perley Jain as Physician Assistant (Pain Medicine) Annamary Rummage Jenelle Mages, DPM as Consulting Physician (Podiatry) Liliane Shi Dorian Furnace, MD as Consulting Physician (Urology) Bonnee Quin, [place] (Cardiology)   Review of Systems  Constitutional:  Positive for fatigue. Negative for appetite change and fever.  HENT:  Negative for congestion, ear pain, sinus pressure and sore throat.   Eyes:  Positive for visual disturbance (Blurred Vision).  Respiratory:  Negative for cough, chest tightness, shortness of  breath and wheezing.   Cardiovascular:  Negative for chest pain and palpitations.  Gastrointestinal:  Negative for abdominal pain, constipation, diarrhea, nausea and vomiting.  Genitourinary:  Negative for dysuria and hematuria.  Musculoskeletal:  Positive for back pain. Negative for arthralgias, joint swelling and myalgias.  Skin:  Negative for rash.  Neurological:  Positive for dizziness and light-headedness. Negative for tremors, syncope, weakness and headaches.  Psychiatric/Behavioral:  Negative for dysphoric mood. The patient is not  nervous/anxious.     Current Outpatient Medications on File Prior to Visit  Medication Sig Dispense Refill   busPIRone (BUSPAR) 10 MG tablet TAKE 2 TABLETS BY MOUTH 2 TIMES DAILY. 360 tablet 2   Continuous Glucose Receiver (FREESTYLE LIBRE 3 READER) DEVI 1 (ONE) EACH AS DIRECTED     Continuous Glucose Sensor (FREESTYLE LIBRE 3 SENSOR) MISC 1 each by Other route every 14 (fourteen) days. 6 each 1   digoxin (LANOXIN) 0.125 MG tablet TAKE 1 TABLET BY MOUTH EVERY DAY 90 tablet 1   esomeprazole (NEXIUM) 40 MG capsule Take 40 mg by mouth daily as needed (acid reflux).  0   Evolocumab (REPATHA SURECLICK) 140 MG/ML SOAJ Inject 140 mg into the skin every 14 (fourteen) days. 2 mL 2   ezetimibe (ZETIA) 10 MG tablet TAKE 1 TABLET BY MOUTH EVERY DAY 90 tablet 1   Glucose Blood (BLOOD GLUCOSE TEST STRIPS) STRP 100 each by In Vitro route in the morning, at noon, and at bedtime. May substitute to any manufacturer covered by patient's insurance. 100 strip 0   JARDIANCE 25 MG TABS tablet Take 1 tablet (25 mg total) by mouth daily. (Patient taking differently: Take 12.5 mg by mouth daily.) 30 tablet 3   Lancet Device MISC 1 each by Does not apply route in the morning, at noon, and at bedtime. May substitute to any manufacturer covered by patient's insurance. 1 each 0   Lancets Misc. MISC 1 each by Does not apply route in the morning, at noon, and at bedtime. May substitute to any manufacturer covered by patient's insurance. 100 each 0   LANTUS SOLOSTAR 100 UNIT/ML Solostar Pen Inject 40 Units into the skin daily. 15 mL 6   linaclotide (LINZESS) 290 MCG CAPS capsule Take 290 mcg by mouth daily as needed (constipation).     oxyCODONE (ROXICODONE) 15 MG immediate release tablet Take 15 mg by mouth every 4 (four) hours as needed for pain.   0   pregabalin (LYRICA) 75 MG capsule Take 75 mg by mouth 3 (three) times daily.     RYBELSUS 14 MG TABS Take 1 tablet (14 mg total) by mouth daily. (Patient taking differently:  Take 0.5 tablets by mouth daily.) 30 tablet 1   TRINTELLIX 20 MG TABS tablet Take 1 tablet (20 mg total) by mouth daily. 30 tablet 2   Vitamin D, Ergocalciferol, (DRISDOL) 50000 units CAPS capsule Take 50,000 Units by mouth every Tuesday.  0   Blood Glucose Monitoring Suppl DEVI 1 each by Does not apply route in the morning, at noon, and at bedtime. May substitute to any manufacturer covered by patient's insurance. 1 each 0   Glucagon (GVOKE HYPOPEN 1-PACK) 1 MG/0.2ML SOAJ Inject 1 Pen into the skin as directed. (Patient not taking: Reported on 06/22/2023) 0.2 mL 3   mupirocin ointment (BACTROBAN) 2 % Apply 1 Application topically daily as needed (wound care). (Patient not taking: Reported on 06/22/2023) 30 g 1   naloxone (NARCAN) nasal spray 4 mg/0.1 mL INSTILL 1 DOSE SQUIRTED  IN NOSTRIL FOR EXCESSIVE SEDATION, REPEAT IN 3 MINUTES IF NO IMPROVEMENT (Patient not taking: Reported on 06/22/2023)     Current Facility-Administered Medications on File Prior to Visit  Medication Dose Route Frequency Provider Last Rate Last Admin   magnesium citrate solution 1 Bottle  1 Bottle Oral Once Berneice Heinrich Delbert Phenix., MD       Past Medical History:  Diagnosis Date   AICD (automatic cardioverter/defibrillator) present    Anxiety    Arthritis    Cardiac defibrillator in situ    Cardiomyopathy [facility])    Chronic lower back pain    Chronic pain syndrome    Chronic systolic (congestive) heart failure (HCC)    Depression    Diabetic neuropathy (HCC)    GERD (gastroesophageal reflux disease)    Myocardial infarction [facility])    Past Surgical History:  Procedure Laterality Date   BACK SURGERY     CARDIAC DEFIBRILLATOR PLACEMENT     In Situ    Family History  Problem Relation Age of Onset   Heart failure Mother    Diabetes Mother    Diabetes Sister    Diabetes Brother    Diabetes Brother    Heart failure Maternal Grandmother    Diabetes Maternal Grandmother    Heart failure Maternal Grandfather    Diabetes  Maternal Grandfather    Cancer Paternal Grandmother    Social History   Socioeconomic History   Marital status: Widowed    Spouse name: Not on file   Number of children: 1   Years of education: Not on file   Highest education level: 8th grade  Occupational History   Occupation: Disabled    Comment: 2018  Tobacco Use   Smoking status: Former    Types: Cigarettes    Passive exposure: Never   Smokeless tobacco: Never  Vaping Use   Vaping status: Never Used  Substance and Sexual Activity   Alcohol use: Never   Drug use: Never   Sexual activity: Yes    Partners: Female    Birth control/protection: None  Other Topics Concern   Not on file  Social History Narrative   Not on file   Social Drivers of Health   Financial Resource Strain: Medium Risk (01/30/2023)   Overall Financial Resource Strain (CARDIA)    Difficulty of Paying Living Expenses: Somewhat hard  Food Insecurity: No Food Insecurity (05/07/2023)   Hunger Vital Sign    Worried About Running Out of Food in the Last Year: Never true    Ran Out of Food in the Last Year: Never true  Transportation Needs: No Transportation Needs (05/07/2023)   PRAPARE - Administrator, Civil Service (Medical): No    Lack of Transportation (Non-Medical): No  Physical Activity: Insufficiently Active (01/30/2023)   Exercise Vital Sign    Days of Exercise per Week: 2 days    Minutes of Exercise per Session: 20 min  Stress: No Stress Concern Present (01/30/2023)   [employer] of Occupational Health - Occupational Stress Questionnaire    Feeling of Stress : Only a little  Social Connections: Moderately Integrated (01/30/2023)   Social Connection and Isolation Panel [NHANES]    Frequency of Communication with Friends and Family: More than three times a week    Frequency of Social Gatherings with Friends and Family: Three times a week    Attends Religious Services: More than 4 times per year    Active Member of Clubs or  Organizations: Yes  Attends Banker Meetings: More than 4 times per year    Marital Status: Widowed    Objective:  BP (!) 90/50   Pulse 80   Temp 97.8 F (36.6 C) (Temporal)   Ht 5\' 8"  (1.727 m)   Wt 177 lb (80.3 kg)   SpO2 98%   BMI 26.91 kg/m      06/22/2023    2:29 PM 06/22/2023    1:45 PM 05/08/2023   12:12 PM  BP/Weight  Systolic BP 90 86 100  Diastolic BP 50 58 58  Wt. (Lbs)  177   BMI  26.91 kg/m2     Physical Exam Vitals reviewed.  Constitutional:      Appearance: Normal appearance.  Neck:     Vascular: No carotid bruit.  Cardiovascular:     Rate and Rhythm: Normal rate and regular rhythm.     Heart sounds: Normal heart sounds.  Pulmonary:     Effort: Pulmonary effort is normal.     Breath sounds: Normal breath sounds.  Abdominal:     General: Bowel sounds are normal.     Palpations: Abdomen is soft.     Tenderness: There is no abdominal tenderness.  Neurological:     Mental Status: He is alert and oriented to person, place, and time.  Psychiatric:        Mood and Affect: Mood normal.        Behavior: Behavior normal.     Diabetic Foot Exam - Simple   No data filed      Lab Results  Component Value Date   WBC 5.1 06/22/2023   HGB 11.9 (L) 06/22/2023   HCT 36.4 (L) 06/22/2023   PLT 228 06/22/2023   GLUCOSE 221 (H) 06/22/2023   CHOL 158 06/22/2023   TRIG 367 (H) 06/22/2023   HDL 30 (L) 06/22/2023   LDLCALC 70 06/22/2023   ALT 27 06/22/2023   AST 28 06/22/2023   NA 140 06/22/2023   K 5.5 (H) 06/22/2023   CL 103 06/22/2023   CREATININE 2.06 (H) 06/22/2023   BUN 26 (H) 06/22/2023   CO2 22 06/22/2023   TSH 1.190 02/03/2023   HGBA1C 8.8 (H) 06/22/2023   Total time spent on today's visit was greater than 45 minutes, including both face-to-face time and nonface-to-face time personally spent on review of chart (labs and imaging), discussing labs and goals, discussing further work-up, treatment options, referrals to specialist if  needed, reviewing outside records of pertinent, answering patient's questions, and coordinating care.    Assessment & Plan:    Type 2 diabetes mellitus with hyperglycemia, with long-term current use of insulin (HCC) Assessment & Plan: Recent blood glucose of 216 an hour post-meal. Patient on Lantus and NovoLog insulin, as well as Jardiance and Rybelsus. Recent adjustment in insulin dosing due to high afternoon glucose levels. -Adjust Lantus dosing to 25 units in the morning and 15 units before dinner. -Monitor blood glucose levels and report to provider in one week for potential further adjustments. -Order A1c to assess overall glycemic control.  Orders: -     CBC with Differential/Platelet -     Comprehensive metabolic panel -     Hemoglobin A1c  Chronic fatigue -     Lipid panel -     Iron, TIBC and Ferritin Panel -     C-reactive protein  Chronic systolic congestive heart failure (HCC) Assessment & Plan: History of non-ischemic cardiomyopathy, atrial fibrillation, and biventricular defibrillator placement. Currently on digoxin,  with carvedilol recently stopped due to hypotension. -Will reach out to cardiologist to see if they can see him sooner then March.     Painful diabetic neuropathy (HCC) Assessment & Plan: Patient on Lyrica for neuropathy, recently reduced from 150mg  to 75mg  due to side effects. -Continue Lyrica at current dose and monitor for improvement in neuropathy symptoms.   Orthostatic hypotension Assessment & Plan: Persistent low blood pressure with symptoms of lightheadedness and dizziness, particularly upon standing. Recent history of nephrectomy. No current antihypertensive medications. -Encourage increased fluid intake. -Check blood pressure in both arms, monitor for further drops, and seek emergency care if systolic drops to 70 or diastolic to 40. -Order comprehensive metabolic panel, CBC, and iron studies to assess for potential anemia or electrolyte  imbalances contributing to hypotension.   Other orders -     Digitoxin level -     Specimen status report     No orders of the defined types were placed in this encounter.   Orders Placed This Encounter  Procedures   CBC with Differential/Platelet   Comprehensive metabolic panel   Hemoglobin A1c   Lipid panel   Iron, TIBC and Ferritin Panel   C-reactive protein   Digitoxin level   Specimen status report    Follow-up in 3 months to monitor blood glucose control, blood pressure, and overall health status.      Follow-up: Return in about 3 months (around 09/19/2023) for Chronic, [name].   I,Lauren M Auman,acting as a Neurosurgeon for [employer], PA.,have documented all relevant documentation on the behalf of [name], PA,as directed by  [name], PA while in the presence of [name], [place].   An After Visit Summary was printed and given to the patient.  [name], [place] [facility] [phone]

## 2023-06-22 NOTE — Assessment & Plan Note (Signed)
 Persistent low blood pressure with symptoms of lightheadedness and dizziness, particularly upon standing. Recent history of nephrectomy. No current antihypertensive medications. -Encourage increased fluid intake. -Check blood pressure in both arms, monitor for further drops, and seek emergency care if systolic drops to 70 or diastolic to 40. -Order comprehensive metabolic panel, CBC, and iron studies to assess for potential anemia or electrolyte imbalances contributing to hypotension.

## 2023-06-22 NOTE — Assessment & Plan Note (Signed)
 History of non-ischemic cardiomyopathy, atrial fibrillation, and biventricular defibrillator placement. Currently on digoxin, with carvedilol recently stopped due to hypotension. -Will reach out to cardiologist to see if they can see him sooner then March.

## 2023-06-23 ENCOUNTER — Telehealth: Payer: Self-pay

## 2023-06-23 ENCOUNTER — Other Ambulatory Visit: Payer: Self-pay | Admitting: Physician Assistant

## 2023-06-23 DIAGNOSIS — I950 Idiopathic hypotension: Secondary | ICD-10-CM

## 2023-06-23 LAB — IRON,TIBC AND FERRITIN PANEL
Ferritin: 146 ng/mL (ref 30–400)
Iron Saturation: 26 % (ref 15–55)
Iron: 81 ug/dL (ref 38–169)
Total Iron Binding Capacity: 310 ug/dL (ref 250–450)
UIBC: 229 ug/dL (ref 111–343)

## 2023-06-23 LAB — COMPREHENSIVE METABOLIC PANEL
ALT: 27 [IU]/L (ref 0–44)
AST: 28 [IU]/L (ref 0–40)
Albumin: 4.4 g/dL (ref 3.8–4.9)
Alkaline Phosphatase: 50 [IU]/L (ref 44–121)
BUN/Creatinine Ratio: 13 (ref 9–20)
BUN: 26 mg/dL — ABNORMAL HIGH (ref 6–24)
Bilirubin Total: 0.4 mg/dL (ref 0.0–1.2)
CO2: 22 mmol/L (ref 20–29)
Calcium: 9.3 mg/dL (ref 8.7–10.2)
Chloride: 103 mmol/L (ref 96–106)
Creatinine, Ser: 2.06 mg/dL — ABNORMAL HIGH (ref 0.76–1.27)
Globulin, Total: 3 g/dL (ref 1.5–4.5)
Glucose: 221 mg/dL — ABNORMAL HIGH (ref 70–99)
Potassium: 5.5 mmol/L — ABNORMAL HIGH (ref 3.5–5.2)
Sodium: 140 mmol/L (ref 134–144)
Total Protein: 7.4 g/dL (ref 6.0–8.5)
eGFR: 37 mL/min/{1.73_m2} — ABNORMAL LOW (ref >59)

## 2023-06-23 LAB — CBC WITH DIFFERENTIAL/PLATELET
Basophils Absolute: 0 10*3/uL (ref 0.0–0.2)
Basos: 0 %
EOS (ABSOLUTE): 0.1 10*3/uL (ref 0.0–0.4)
Eos: 1 %
Hematocrit: 36.4 % — ABNORMAL LOW (ref 37.5–51.0)
Hemoglobin: 11.9 g/dL — ABNORMAL LOW (ref 13.0–17.7)
Immature Grans (Abs): 0 10*3/uL (ref 0.0–0.1)
Immature Granulocytes: 0 %
Lymphocytes Absolute: 1.6 10*3/uL (ref 0.7–3.1)
Lymphs: 32 %
MCH: 27.9 pg (ref 26.6–33.0)
MCHC: 32.7 g/dL (ref 31.5–35.7)
MCV: 85 fL (ref 79–97)
Monocytes Absolute: 0.4 10*3/uL (ref 0.1–0.9)
Monocytes: 8 %
Neutrophils Absolute: 2.9 10*3/uL (ref 1.4–7.0)
Neutrophils: 59 %
Platelets: 228 10*3/uL (ref 150–450)
RBC: 4.27 x10E6/uL (ref 4.14–5.80)
RDW: 13.5 % (ref 11.6–15.4)
WBC: 5.1 10*3/uL (ref 3.4–10.8)

## 2023-06-23 LAB — LIPID PANEL
Chol/HDL Ratio: 5.3 {ratio} — ABNORMAL HIGH (ref 0.0–5.0)
Cholesterol, Total: 158 mg/dL (ref 100–199)
HDL: 30 mg/dL — ABNORMAL LOW (ref >39)
LDL Chol Calc (NIH): 70 mg/dL (ref 0–99)
Triglycerides: 367 mg/dL — ABNORMAL HIGH (ref 0–149)
VLDL Cholesterol Cal: 58 mg/dL — ABNORMAL HIGH (ref 5–40)

## 2023-06-23 LAB — HEMOGLOBIN A1C
Est. average glucose Bld gHb Est-mCnc: 206 mg/dL
Hgb A1c MFr Bld: 8.8 % — ABNORMAL HIGH (ref 4.8–5.6)

## 2023-06-23 LAB — C-REACTIVE PROTEIN: CRP: <1 mg/L (ref 0–10)

## 2023-06-23 MED ORDER — MIDODRINE HCL 2.5 MG PO TABS: 2.5000 mg | ORAL_TABLET | Freq: Three times a day (TID) | ORAL | 1 refills | Status: DC

## 2023-06-23 NOTE — Telephone Encounter (Signed)
-----   Message from [name] sent at 06/23/2023  1:54 PM EST ----- Regarding: FW: Heart appointment  ----- Message ----- From: Chinita Greenland, CMA Sent: 06/23/2023   8:46 AM EST To: [name], PA Subject: FW: Heart appointment                          Spoke with [facility] office, front desk they do not have anything before his appointment but will put him on the wait list. ----- Message ----- From: [name], PA Sent: 06/23/2023   7:48 AM EST To: [facility] Subject: Heart appointment                              He sees a cardiologist with [facility]. The one the patient mentioned seeing frequently is Calla Kicks [facility]. Can we call his office and let them know Mr.Vigil is experiencing very low BP (84/54, 90/58) frequently along with lightheadedness. They had to move his recent appointment on 02/14 to the middle of March. He needs to be seen sooner if possible.

## 2023-06-23 NOTE — Telephone Encounter (Addendum)
 Called and spoke with front staff who claimed both Provider and clinical staff were unavailable. Informed her that patient waiting until his scheduled appt and being placed on a cancellation list was unacceptable that PCP needs patient evaluated next week. Informed her patient has very lower bp and is not on BP medications. She stated that STAT order would need to be sent over stating why patient needed to be seen earlier, informed her patient is already established... fax is [phone].

## 2023-06-23 NOTE — Telephone Encounter (Signed)
 Patient aware our office has contacted Dr. Chester Holstein office regarding appt (see previous telephone note). Informed him [name] will be calling Cardiology office himself and we will call him back today or tomorrow regarding his appt with their office. Patient verbalized understanding.  Copied from CRM [id number]. Topic: General - Other >> Jun 23, 2023  1:44 PM Greg Esparza wrote: Reason for CRM: Pt called stated Cardiologist have not received email from [name] to scheduled pt before 07/19/23 with Cardiologist due to pt's low blood pressure. Please call pt. At  [phone].

## 2023-06-23 NOTE — Telephone Encounter (Signed)
 Per [name] (after speaking with Rossie Muskrat, PA) patient will need to keep a bp log and check bp BID. Midodrine rx sent to pharmacy. Called and informed patient of the information above and made him aware that the Cardiology office will be reaching out regarding his appt. Patient verbalized understanding and expressed understanding.

## 2023-06-23 NOTE — Telephone Encounter (Signed)
Error

## 2023-06-24 ENCOUNTER — Telehealth: Payer: Self-pay

## 2023-06-24 NOTE — Telephone Encounter (Signed)
 Called and spoke with Greg Esparza who stated patient has an appt scheduled this Tuesday at 9:15 a.m. with Cardiology Greg Esparza) and they received notification today that digoxin and midodrine were ready for pick up, which they plan to pick up today.

## 2023-06-25 ENCOUNTER — Encounter: Payer: Self-pay | Admitting: Physician Assistant

## 2023-06-25 ENCOUNTER — Other Ambulatory Visit: Payer: Self-pay | Admitting: Physician Assistant

## 2023-06-25 DIAGNOSIS — I950 Idiopathic hypotension: Secondary | ICD-10-CM

## 2023-06-25 DIAGNOSIS — E1165 Type 2 diabetes mellitus with hyperglycemia: Secondary | ICD-10-CM

## 2023-06-25 DIAGNOSIS — I5022 Chronic systolic (congestive) heart failure: Secondary | ICD-10-CM

## 2023-06-26 LAB — SPECIMEN STATUS REPORT

## 2023-06-26 LAB — DIGITOXIN LEVEL: Digitoxin Lvl: <5 ng/mL — ABNORMAL LOW (ref 10–25)

## 2023-06-29 NOTE — Progress Notes (Unsigned)
 06/30/2023 Lovenia Kim [id number] August 05, 1965  Referring provider: [name], PA Primary GI doctor: Dr. Chales Abrahams  ASSESSMENT AND PLAN:   Personal history of tubular adenomatous polyp Had previous colonoscopy with Dr. Jennye Boroughs at [facility] 2019 had extremely poor prep with liquid and solid stool throughout the colon had a 1.2 cm cecal polyp removed  follow-up in 6 months to 1 year not done With hypotension, DOE and possible previous large polyp will schedule at the hospital with Dr. Chales Abrahams Will still get cardiac clearance Will do 2 day prep  Constipation likely drug induced due to opioids Worse with RCC nephrectomy Takes linzess 290 mcg and miralax as needed Do linzess daily with miralax as needed Consider movantik if not helping  Renal cell carcinoma S/p right nephrectomy on Jan 10th with [facility] Has had some weight loss Will get records  DOE no chest pain Worse with exertion, stairs, Dry cough Has productive mucus, white No swelling in legs Goes for stress test, March 17th at [facility] No recent xray/imaging, may go see pulmonary  Hypotension Started on midodrine, will get cortisol Will try to get records  GERD  Worse with spicy foods, no dysphagia On nexium well controlled  History of systolic heart failure status post AICD Most recent echo that I can find was 2023 and showed normal ejection fraction, no chest pain.  Has some SOB  Type 2 diabetes with neuropathy On Jardiance and Rybelsus  Chronic pain On oxycodone 15 mg QID  Abdominal wall pain Pain likely due to abdominal wall, not hernia. Avoid steroids due to diabetes. - Recommend Salonpas patch for pain relief.  Patient Care Team: [name], [place] as PCP - General (Physician Assistant) Iven Finn., MD as Referring Physician (Neurology) Sandy Salaam, MD as Referring Physician (Cardiology) Perley Jain as Physician Assistant (Pain Medicine) Annamary Rummage Jenelle Mages, DPM as  Consulting Physician (Podiatry) Liliane Shi Dorian Furnace, MD as Consulting Physician (Urology) Bonnee Quin, [place] (Cardiology)  HISTORY OF PRESENT ILLNESS: Discussed the use of AI scribe software for clinical note transcription with the patient, who gave verbal consent to proceed.  History of Present Illness   TRACER GUTRIDGE is a 58 year old male with diabetes and cardiomyopathy who presents for evaluation of colonoscopy. He is transferring care from Dr. Braulio Conte to Dr. Chales Abrahams for evaluation of colonoscopy.  His first colonoscopy on January 21, 2018, revealed a 1.2 cm sessile polyp in the cecum, identified as a tubular adenomatous polyp. The bowel preparation was poor, with residual solid and liquid stool throughout the colon. He was advised to have a repeat colonoscopy within six months to one year, which was not completed. No family history of colon cancer.  He experiences constipation, which he feels has worsened since his right kidney was removed due to cancer. Bowel movements are infrequent, with stool and gas building up and releasing all at once. He manages constipation with Miralax, mixed with coffee and taken as needed, and Linzess 290 mcg, also taken as needed due to fear of diarrhea. Linzess initially helped, but he feels a higher dose may be needed. He takes oxycodone four times a day for back pain, which may contribute to constipation.  He has a history of right kidney cancer, for which he underwent nephrectomy in January 2025. He reports some weight loss since the surgery but is unsure if it is related to the kidney removal.  He describes a persistent needle-like pain in the left abdominal area, tender  to touch but not bothersome unless pressed.  He experiences shortness of breath, particularly when walking up stairs or for long periods. He has a dry cough and sometimes spits up clear mucus. No chest pain is reported, but he has a history of heartburn controlled with Nexium  as needed. He has cardiomyopathy and an AICD placement, with the last echocardiogram in March 2023 showing an ejection fraction of 55-60%. He is scheduled for a stress test on July 12, 2023.  He reports sweating profusely after eating, regardless of the type of food, possibly related to his diabetes. His diabetes management includes Jardiance and Rybelsus, with an A1c of 8.8, indicating suboptimal control.      He  reports that he has quit smoking. His smoking use included cigarettes. He has never been exposed to tobacco smoke. He has never used smokeless tobacco. He reports that he does not drink alcohol and does not use drugs.  RELEVANT GI HISTORY, IMAGING AND LABS:  CBC    Component Value Date/Time   WBC 5.1 06/22/2023 1454   WBC 5.6 04/30/2023 1308   RBC 4.27 06/22/2023 1454   RBC 4.55 04/30/2023 1308   HGB 11.9 (L) 06/22/2023 1454   HCT 36.4 (L) 06/22/2023 1454   PLT 228 06/22/2023 1454   MCV 85 06/22/2023 1454   MCH 27.9 06/22/2023 1454   MCH 28.4 04/30/2023 1308   MCHC 32.7 06/22/2023 1454   MCHC 33.3 04/30/2023 1308   RDW 13.5 06/22/2023 1454   LYMPHSABS 1.6 06/22/2023 1454   EOSABS 0.1 06/22/2023 1454   BASOSABS 0.0 06/22/2023 1454   Recent Labs    02/03/23 1609 04/30/23 1308 05/07/23 1424 05/08/23 0456 06/22/23 1454  HGB 14.2 12.9* 12.3* 11.9* 11.9*    CMP     Component Value Date/Time   NA 140 06/22/2023 1454   K 5.5 (H) 06/22/2023 1454   CL 103 06/22/2023 1454   CO2 22 06/22/2023 1454   GLUCOSE 221 (H) 06/22/2023 1454   GLUCOSE 229 (H) 05/08/2023 0456   BUN 26 (H) 06/22/2023 1454   CREATININE 2.06 (H) 06/22/2023 1454   CALCIUM 9.3 06/22/2023 1454   PROT 7.4 06/22/2023 1454   ALBUMIN 4.4 06/22/2023 1454   AST 28 06/22/2023 1454   ALT 27 06/22/2023 1454   ALKPHOS 50 06/22/2023 1454   BILITOT 0.4 06/22/2023 1454   GFRNONAA 32 (L) 05/08/2023 0456   GFRAA 41 (L) 01/11/2018 1740      Latest Ref Rng & Units 06/22/2023    2:54 PM 04/02/2023   10:45 AM  02/03/2023    4:09 PM  Hepatic Function  Total Protein 6.0 - 8.5 g/dL 7.4  7.6  8.0   Albumin 3.8 - 4.9 g/dL 4.4  4.5  4.7   AST 0 - 40 IU/L 28  26  24    ALT 0 - 44 IU/L 27  24  20    Alk Phosphatase 44 - 121 IU/L 50  58  57   Total Bilirubin 0.0 - 1.2 mg/dL 0.4  0.3  0.4       Current Medications:   Current Outpatient Medications (Endocrine & Metabolic):    JARDIANCE 25 MG TABS tablet, Take 1 tablet (25 mg total) by mouth daily. (Patient taking differently: Take 12.5 mg by mouth daily.)   LANTUS SOLOSTAR 100 UNIT/ML Solostar Pen, Inject 40 Units into the skin daily.   RYBELSUS 14 MG TABS, Take 1 tablet (14 mg total) by mouth daily. (Patient taking differently: Take 0.5 tablets  by mouth daily.)   Glucagon (GVOKE HYPOPEN 1-PACK) 1 MG/0.2ML SOAJ, Inject 1 Pen into the skin as directed. (Patient not taking: Reported on 06/22/2023)   Current Outpatient Medications (Cardiovascular):    digoxin (LANOXIN) 0.125 MG tablet, TAKE 1 TABLET BY MOUTH EVERY DAY   Evolocumab (REPATHA SURECLICK) 140 MG/ML SOAJ, Inject 140 mg into the skin every 14 (fourteen) days.   ezetimibe (ZETIA) 10 MG tablet, TAKE 1 TABLET BY MOUTH EVERY DAY   midodrine (PROAMATINE) 2.5 MG tablet, Take 1 tablet (2.5 mg total) by mouth 3 (three) times daily with meals.     Current Outpatient Medications (Analgesics):    oxyCODONE (ROXICODONE) 15 MG immediate release tablet, Take 15 mg by mouth every 4 (four) hours as needed for pain.      Current Outpatient Medications (Other):    Blood Glucose Monitoring Suppl DEVI, 1 each by Does not apply route in the morning, at noon, and at bedtime. May substitute to any manufacturer covered by patient's insurance.   busPIRone (BUSPAR) 10 MG tablet, TAKE 2 TABLETS BY MOUTH 2 TIMES DAILY.   Continuous Glucose Receiver (FREESTYLE LIBRE 3 READER) DEVI, 1 (ONE) EACH AS DIRECTED   Continuous Glucose Sensor (FREESTYLE LIBRE 3 SENSOR) MISC, 1 each by Other route every 14 (fourteen) days.    esomeprazole (NEXIUM) 40 MG capsule, Take 40 mg by mouth daily as needed (acid reflux).   Glucose Blood (BLOOD GLUCOSE TEST STRIPS) STRP, 100 each by In Vitro route in the morning, at noon, and at bedtime. May substitute to any manufacturer covered by patient's insurance.   Lancet Device MISC, 1 each by Does not apply route in the morning, at noon, and at bedtime. May substitute to any manufacturer covered by patient's insurance.   Lancets Misc. MISC, 1 each by Does not apply route in the morning, at noon, and at bedtime. May substitute to any manufacturer covered by patient's insurance.   linaclotide (LINZESS) 290 MCG CAPS capsule, Take 290 mcg by mouth daily as needed (constipation).   mupirocin ointment (BACTROBAN) 2 %, Apply 1 Application topically daily as needed (wound care).   pregabalin (LYRICA) 75 MG capsule, Take 75 mg by mouth 3 (three) times daily.   TRINTELLIX 20 MG TABS tablet, Take 1 tablet (20 mg total) by mouth daily.   Vitamin D, Ergocalciferol, (DRISDOL) 50000 units CAPS capsule, Take 50,000 Units by mouth every Tuesday.   naloxone (NARCAN) nasal spray 4 mg/0.1 mL, INSTILL 1 DOSE SQUIRTED IN NOSTRIL FOR EXCESSIVE SEDATION, REPEAT IN 3 MINUTES IF NO IMPROVEMENT (Patient not taking: Reported on 06/30/2023)   Facility-Administered Medications Ordered in Other Visits (Other):    magnesium citrate solution 1 Bottle No current facility-administered medications for this visit.  Medical History:  Past Medical History:  Diagnosis Date   AICD (automatic cardioverter/defibrillator) present    Anxiety    Arthritis    Cardiac defibrillator in situ    Cardiomyopathy (HCC)    CHF (congestive heart failure) (HCC)    Chronic lower back pain    Chronic pain syndrome    Chronic systolic (congestive) heart failure (HCC)    CKD (chronic kidney disease)    Depression    Diabetes (HCC)    Diabetic neuropathy (HCC)    GERD (gastroesophageal reflux disease)    History of kidney cancer     Hypotension    Myocardial infarction (HCC)    Allergies:  Allergies  Allergen Reactions   Lipitor [Atorvastatin] Other (See Comments)    Muscle pain  Oxymorphone Other (See Comments)    Urinary retention   Gabapentin Other (See Comments)   Lisinopril Cough    cough   Morphine Nausea And Vomiting   Penicillin G Rash     Surgical History:  He  has a past surgical history that includes Back surgery; Cardiac defibrillator placement; Kidney cyst removal (Right); and Wisdom tooth extraction. Family History:  His family history includes Cancer in his paternal grandmother; Diabetes in his brother, brother, maternal grandfather, maternal grandmother, mother, and sister; Heart failure in his maternal grandfather, maternal grandmother, and mother.  REVIEW OF SYSTEMS  : All other systems reviewed and negative except where noted in the History of Present Illness.  PHYSICAL EXAM: BP 90/60 (BP Location: Left Arm, Patient Position: Sitting, Cuff Size: Normal)   Pulse 83   Ht 5\' 8"  (1.727 m)   Wt 181 lb 3.2 oz (82.2 kg)   BMI 27.55 kg/m  Physical Exam   GENERAL APPEARANCE: Well nourished, in no apparent distress. HEENT: No cervical lymphadenopathy, unremarkable thyroid, sclerae anicteric, conjunctiva pink. RESPIRATORY: Respiratory effort normal, breath sounds decreased, equal bilateral without rales, rhonchi, or wheezing. CARDIO: RRR with no MRGs, peripheral pulses intact. ABDOMEN: Soft, non-distended, active bowel sounds in all 4 quadrants, left peri-umbilical abdominal wall pain on palpation,+ carrnett, no rebound, no mass appreciated. RECTAL: Declines. MUSCULOSKELETAL: Full ROM, normal gait, without edema. SKIN: Dry, intact without rashes or lesions. No jaundice. NEURO: Alert, oriented, no focal deficits. PSYCH: Cooperative, normal mood and affect.      Doree Albee, PA-C 12:14 PM

## 2023-06-30 ENCOUNTER — Ambulatory Visit: Payer: Medicaid Other | Admitting: Physician Assistant

## 2023-06-30 ENCOUNTER — Other Ambulatory Visit (INDEPENDENT_AMBULATORY_CARE_PROVIDER_SITE_OTHER)

## 2023-06-30 ENCOUNTER — Encounter: Payer: Self-pay | Admitting: Physician Assistant

## 2023-06-30 ENCOUNTER — Ambulatory Visit (INDEPENDENT_AMBULATORY_CARE_PROVIDER_SITE_OTHER)
Admission: RE | Admit: 2023-06-30 | Discharge: 2023-06-30 | Disposition: A | Discharge status: Home / Self Care | Source: Ambulatory Visit | Attending: Physician Assistant | Admitting: Physician Assistant

## 2023-06-30 VITALS — BP 90/60 | HR 83 | Ht 68.0 in | Wt 181.2 lb

## 2023-06-30 DIAGNOSIS — R0609 Other forms of dyspnea: Secondary | ICD-10-CM | POA: Diagnosis not present

## 2023-06-30 DIAGNOSIS — K219 Gastro-esophageal reflux disease without esophagitis: Secondary | ICD-10-CM

## 2023-06-30 DIAGNOSIS — I959 Hypotension, unspecified: Secondary | ICD-10-CM

## 2023-06-30 DIAGNOSIS — I429 Cardiomyopathy, unspecified: Secondary | ICD-10-CM

## 2023-06-30 DIAGNOSIS — E11621 Type 2 diabetes mellitus with foot ulcer: Secondary | ICD-10-CM

## 2023-06-30 DIAGNOSIS — Z905 Acquired absence of kidney: Secondary | ICD-10-CM

## 2023-06-30 DIAGNOSIS — K5909 Other constipation: Secondary | ICD-10-CM

## 2023-06-30 DIAGNOSIS — Z860101 Personal history of adenomatous and serrated colon polyps: Secondary | ICD-10-CM

## 2023-06-30 DIAGNOSIS — K5903 Drug induced constipation: Secondary | ICD-10-CM

## 2023-06-30 DIAGNOSIS — Z95810 Presence of automatic (implantable) cardiac defibrillator: Secondary | ICD-10-CM

## 2023-06-30 DIAGNOSIS — L97509 Non-pressure chronic ulcer of other part of unspecified foot with unspecified severity: Secondary | ICD-10-CM

## 2023-06-30 DIAGNOSIS — Z794 Long term (current) use of insulin: Secondary | ICD-10-CM

## 2023-06-30 LAB — CBC WITH DIFFERENTIAL/PLATELET
Basophils Absolute: 0.1 10*3/uL (ref 0.0–0.1)
Basophils Relative: 1.4 % (ref 0.0–3.0)
Eosinophils Absolute: 0.1 10*3/uL (ref 0.0–0.7)
Eosinophils Relative: 2 % (ref 0.0–5.0)
HCT: 38.8 % — ABNORMAL LOW (ref 39.0–52.0)
Hemoglobin: 13 g/dL (ref 13.0–17.0)
Lymphocytes Relative: 32.7 % (ref 12.0–46.0)
Lymphs Abs: 1.8 10*3/uL (ref 0.7–4.0)
MCHC: 33.5 g/dL (ref 30.0–36.0)
MCV: 84.4 fl (ref 78.0–100.0)
Monocytes Absolute: 0.6 10*3/uL (ref 0.1–1.0)
Monocytes Relative: 10.3 % (ref 3.0–12.0)
Neutro Abs: 2.9 10*3/uL (ref 1.4–7.7)
Neutrophils Relative %: 53.6 % (ref 43.0–77.0)
Platelets: 229 10*3/uL (ref 150.0–400.0)
RBC: 4.6 Mil/uL (ref 4.22–5.81)
RDW: 14.3 % (ref 11.5–15.5)
WBC: 5.5 10*3/uL (ref 4.0–10.5)

## 2023-06-30 LAB — COMPREHENSIVE METABOLIC PANEL
ALT: 25 U/L (ref 0–53)
AST: 26 U/L (ref 0–37)
Albumin: 4.4 g/dL (ref 3.5–5.2)
Alkaline Phosphatase: 45 U/L (ref 39–117)
BUN: 27 mg/dL — ABNORMAL HIGH (ref 6–23)
CO2: 26 meq/L (ref 19–32)
Calcium: 9.5 mg/dL (ref 8.4–10.5)
Chloride: 103 meq/L (ref 96–112)
Creatinine, Ser: 1.98 mg/dL — ABNORMAL HIGH (ref 0.40–1.50)
GFR: 37.24 mL/min — ABNORMAL LOW (ref >60.00)
Glucose, Bld: 138 mg/dL — ABNORMAL HIGH (ref 70–99)
Potassium: 4.6 meq/L (ref 3.5–5.1)
Sodium: 137 meq/L (ref 135–145)
Total Bilirubin: 0.4 mg/dL (ref 0.2–1.2)
Total Protein: 8 g/dL (ref 6.0–8.3)

## 2023-06-30 LAB — CORTISOL: Cortisol, Plasma: 11.1 ug/dL

## 2023-06-30 MED ORDER — NA SULFATE-K SULFATE-MG SULF 17.5-3.13-1.6 GM/177ML PO SOLN: 1.0000 | Freq: Once | ORAL | 0 refills | Status: AC

## 2023-06-30 NOTE — Patient Instructions (Addendum)
 Your provider has requested that you have an abdominal x ray before leaving today. Please go to the basement floor to our Radiology department for the test.  Your provider has requested that you go to the basement level for lab work before leaving today. Press "B" on the elevator. The lab is located at the first door on the left as you exit the elevator.   Linzess  *IBS-C patients may begin to experience relief from belly pain and overall abdominal symptoms (pain, discomfort, and bloating) in about 1 week,  with symptoms typically improving over 12 weeks.  Take at least 30 minutes before the first meal of the day on an empty stomach You can have a loose stool if you eat a high-fat breakfast. Give it at least 7 days, may have more bowel movements during that time.   The diarrhea should go away and you should start having normal, complete, full bowel movements.  It may be helpful to start treatment when you can be near the comfort of your own bathroom, such as a weekend.  After you are out we can send in a prescription if you did well, there is a prescription card  Miralax is an osmotic laxative.  It only brings more water into the stool.  This is safe to take daily.  Can take up to 17 gram of miralax twice a day.  Mix with juice or coffee.  Start 1 capful at night for 3-4 days and reassess your response in 3-4 days.  You can increase and decrease the dose based on your response.  Remember, it can take up to 3-4 days to take effect OR for the effects to wear off.   I often pair this with benefiber in the morning to help assure the stool is not too loose.   If this not work can do [employer] to help with your constipation - Drink at least 64-80 ounces of water/liquid per day. - Establish a time to try to move your bowels every day.  For many people, this is after a cup of coffee or after a meal such as breakfast. - Sit all of the way back on the toilet keeping your back  fairly straight and while sitting up, try to rest the tops of your forearms on your upper thighs.   - Raising your feet with a step stool/squatty potty can be helpful to improve the angle that allows your stool to pass through the rectum. - Relax the rectum feeling it bulge toward the toilet water.  If you feel your rectum raising toward your body, you are contracting rather than relaxing. - Breathe in and slowly exhale. "Belly breath" by expanding your belly towards your belly button. Keep belly expanded as you gently direct pressure down and back to the anus.  A low pitched GRRR sound can assist with increasing intra-abdominal pressure.  (Can also trying to blow on a pinwheel and make it move, this helps with the same belly breathing) - Repeat 3-4 times. If unsuccessful, contract the pelvic floor to restore normal tone and get off the toilet.  Avoid excessive straining. - To reduce excessive wiping by teaching your anus to normally contract, place hands on outer aspect of knees and resist knee movement outward.  Hold 5-10 second then place hands just inside of knees and resist inward movement of knees.  Hold 5 seconds.  Repeat a few times each way.  Go to the ER if unable to pass gas,  severe AB pain, unable to hold down food, any shortness of breath of chest pain.

## 2023-07-01 ENCOUNTER — Encounter: Payer: Self-pay | Admitting: Physician Assistant

## 2023-07-01 LAB — IGA: Immunoglobulin A: 505 mg/dL — ABNORMAL HIGH (ref 47–310)

## 2023-07-01 LAB — TISSUE TRANSGLUTAMINASE, IGA: (tTG) Ab, IgA: <1 U/mL

## 2023-07-02 ENCOUNTER — Other Ambulatory Visit: Payer: Self-pay | Admitting: Family Medicine

## 2023-07-16 ENCOUNTER — Other Ambulatory Visit: Payer: Self-pay | Admitting: Physician Assistant

## 2023-07-16 DIAGNOSIS — I950 Idiopathic hypotension: Secondary | ICD-10-CM

## 2023-07-19 ENCOUNTER — Telehealth: Payer: Self-pay

## 2023-07-19 NOTE — Telephone Encounter (Signed)
 Left detailed message for patient requesting that he call us back and let us know how his sugars have been running.

## 2023-07-19 NOTE — Telephone Encounter (Signed)
-----   Message from Lambert sent at 07/15/2023 11:55 AM EDT ----- Regarding: FW: Sugars Can we call him and see if his sugars are staying elevated? If so what have they been first thing in the morning? ----- Message ----- From: [name], PA Sent: 07/15/2023  12:00 AM EDT To: [name], [place] Subject: Sugars                                         Call to check on where his sugars are at.

## 2023-07-20 ENCOUNTER — Other Ambulatory Visit: Payer: Self-pay

## 2023-07-20 DIAGNOSIS — E11621 Type 2 diabetes mellitus with foot ulcer: Secondary | ICD-10-CM

## 2023-07-24 ENCOUNTER — Other Ambulatory Visit: Payer: Self-pay

## 2023-07-24 ENCOUNTER — Encounter (HOSPITAL_COMMUNITY): Payer: Self-pay

## 2023-07-24 ENCOUNTER — Observation Stay (HOSPITAL_COMMUNITY)
Admission: EM | Admit: 2023-07-24 | Discharge: 2023-07-25 | Disposition: A | Discharge status: Home / Self Care | Attending: Family Medicine | Admitting: Family Medicine

## 2023-07-24 DIAGNOSIS — Z87891 Personal history of nicotine dependence: Secondary | ICD-10-CM | POA: Diagnosis not present

## 2023-07-24 DIAGNOSIS — E785 Hyperlipidemia, unspecified: Secondary | ICD-10-CM | POA: Diagnosis not present

## 2023-07-24 DIAGNOSIS — E860 Dehydration: Principal | ICD-10-CM

## 2023-07-24 DIAGNOSIS — F39 Unspecified mood [affective] disorder: Secondary | ICD-10-CM | POA: Diagnosis not present

## 2023-07-24 DIAGNOSIS — Z794 Long term (current) use of insulin: Secondary | ICD-10-CM | POA: Diagnosis not present

## 2023-07-24 DIAGNOSIS — E875 Hyperkalemia: Secondary | ICD-10-CM | POA: Insufficient documentation

## 2023-07-24 DIAGNOSIS — I130 Hypertensive heart and chronic kidney disease with heart failure and stage 1 through stage 4 chronic kidney disease, or unspecified chronic kidney disease: Secondary | ICD-10-CM | POA: Diagnosis not present

## 2023-07-24 DIAGNOSIS — Z79899 Other long term (current) drug therapy: Secondary | ICD-10-CM | POA: Diagnosis not present

## 2023-07-24 DIAGNOSIS — Z7984 Long term (current) use of oral hypoglycemic drugs: Secondary | ICD-10-CM | POA: Insufficient documentation

## 2023-07-24 DIAGNOSIS — K529 Noninfective gastroenteritis and colitis, unspecified: Secondary | ICD-10-CM | POA: Diagnosis not present

## 2023-07-24 DIAGNOSIS — E1122 Type 2 diabetes mellitus with diabetic chronic kidney disease: Secondary | ICD-10-CM | POA: Diagnosis not present

## 2023-07-24 DIAGNOSIS — Z95810 Presence of automatic (implantable) cardiac defibrillator: Secondary | ICD-10-CM | POA: Insufficient documentation

## 2023-07-24 DIAGNOSIS — I5022 Chronic systolic (congestive) heart failure: Secondary | ICD-10-CM | POA: Insufficient documentation

## 2023-07-24 DIAGNOSIS — R112 Nausea with vomiting, unspecified: Secondary | ICD-10-CM | POA: Diagnosis present

## 2023-07-24 DIAGNOSIS — N1832 Chronic kidney disease, stage 3b: Secondary | ICD-10-CM | POA: Insufficient documentation

## 2023-07-24 DIAGNOSIS — E873 Alkalosis: Secondary | ICD-10-CM | POA: Diagnosis not present

## 2023-07-24 DIAGNOSIS — A0811 Acute gastroenteropathy due to Norwalk agent: Principal | ICD-10-CM | POA: Insufficient documentation

## 2023-07-24 DIAGNOSIS — G8929 Other chronic pain: Secondary | ICD-10-CM | POA: Diagnosis not present

## 2023-07-24 DIAGNOSIS — R1110 Vomiting, unspecified: Secondary | ICD-10-CM

## 2023-07-24 DIAGNOSIS — E1165 Type 2 diabetes mellitus with hyperglycemia: Secondary | ICD-10-CM

## 2023-07-24 DIAGNOSIS — Z1152 Encounter for screening for COVID-19: Secondary | ICD-10-CM | POA: Insufficient documentation

## 2023-07-24 LAB — COMPREHENSIVE METABOLIC PANEL WITH GFR
ALT: 31 U/L (ref 0–44)
AST: 33 U/L (ref 15–41)
Albumin: 4.4 g/dL (ref 3.5–5.0)
Alkaline Phosphatase: 45 U/L (ref 38–126)
Anion gap: 20 — ABNORMAL HIGH (ref 5–15)
BUN: 31 mg/dL — ABNORMAL HIGH (ref 6–20)
CO2: 18 mmol/L — ABNORMAL LOW (ref 22–32)
Calcium: 9.9 mg/dL (ref 8.9–10.3)
Chloride: 101 mmol/L (ref 98–111)
Creatinine, Ser: 2.39 mg/dL — ABNORMAL HIGH (ref 0.61–1.24)
GFR, Estimated: 31 mL/min — ABNORMAL LOW (ref >60)
Glucose, Bld: 272 mg/dL — ABNORMAL HIGH (ref 70–99)
Potassium: 5.2 mmol/L — ABNORMAL HIGH (ref 3.5–5.1)
Sodium: 139 mmol/L (ref 135–145)
Total Bilirubin: 1.2 mg/dL (ref 0.0–1.2)
Total Protein: 8.5 g/dL — ABNORMAL HIGH (ref 6.5–8.1)

## 2023-07-24 LAB — RESPIRATORY PANEL BY PCR

## 2023-07-24 LAB — BASIC METABOLIC PANEL WITH GFR
Anion gap: 14 (ref 5–15)
BUN: 32 mg/dL — ABNORMAL HIGH (ref 6–20)
CO2: 20 mmol/L — ABNORMAL LOW (ref 22–32)
Calcium: 9.6 mg/dL (ref 8.9–10.3)
Chloride: 103 mmol/L (ref 98–111)
Creatinine, Ser: 2.31 mg/dL — ABNORMAL HIGH (ref 0.61–1.24)
GFR, Estimated: 33 mL/min — ABNORMAL LOW (ref >60)
Glucose, Bld: 298 mg/dL — ABNORMAL HIGH (ref 70–99)
Potassium: 4.9 mmol/L (ref 3.5–5.1)
Sodium: 137 mmol/L (ref 135–145)

## 2023-07-24 LAB — I-STAT VENOUS BLOOD GAS, ED
Acid-base deficit: 2 mmol/L (ref 0.0–2.0)
Bicarbonate: 19.3 mmol/L — ABNORMAL LOW (ref 20.0–28.0)
Calcium, Ion: 1.03 mmol/L — ABNORMAL LOW (ref 1.15–1.40)
HCT: 35 % — ABNORMAL LOW (ref 39.0–52.0)
Hemoglobin: 11.9 g/dL — ABNORMAL LOW (ref 13.0–17.0)
O2 Saturation: 73 %
Potassium: 4.7 mmol/L (ref 3.5–5.1)
Sodium: 139 mmol/L (ref 135–145)
TCO2: 20 mmol/L — ABNORMAL LOW (ref 22–32)
pCO2, Ven: 23.5 mmHg — ABNORMAL LOW (ref 44–60)
pH, Ven: 7.523 — ABNORMAL HIGH (ref 7.25–7.43)
pO2, Ven: 33 mmHg (ref 32–45)

## 2023-07-24 LAB — CBC WITH DIFFERENTIAL/PLATELET
Abs Immature Granulocytes: 0.03 10*3/uL (ref 0.00–0.07)
Basophils Absolute: 0 10*3/uL (ref 0.0–0.1)
Basophils Relative: 0 %
Eosinophils Absolute: 0 10*3/uL (ref 0.0–0.5)
Eosinophils Relative: 0 %
HCT: 40.8 % (ref 39.0–52.0)
Hemoglobin: 14 g/dL (ref 13.0–17.0)
Immature Granulocytes: 0 %
Lymphocytes Relative: 11 %
Lymphs Abs: 0.9 10*3/uL (ref 0.7–4.0)
MCH: 28.3 pg (ref 26.0–34.0)
MCHC: 34.3 g/dL (ref 30.0–36.0)
MCV: 82.4 fL (ref 80.0–100.0)
Monocytes Absolute: 0.7 10*3/uL (ref 0.1–1.0)
Monocytes Relative: 9 %
Neutro Abs: 6.9 10*3/uL (ref 1.7–7.7)
Neutrophils Relative %: 80 %
Platelets: 182 10*3/uL (ref 150–400)
RBC: 4.95 MIL/uL (ref 4.22–5.81)
RDW: 13.1 % (ref 11.5–15.5)
WBC: 8.6 10*3/uL (ref 4.0–10.5)
nRBC: 0 % (ref 0.0–0.2)

## 2023-07-24 LAB — URINALYSIS, ROUTINE W REFLEX MICROSCOPIC
Bacteria, UA: NONE SEEN
Bilirubin Urine: NEGATIVE
Glucose, UA: >=500 mg/dL — AB
Hgb urine dipstick: NEGATIVE
Ketones, ur: 20 mg/dL — AB
Leukocytes,Ua: NEGATIVE
Nitrite: NEGATIVE
Protein, ur: 30 mg/dL — AB
Specific Gravity, Urine: 1.021 (ref 1.005–1.030)
pH: 7 (ref 5.0–8.0)

## 2023-07-24 LAB — RESP PANEL BY RT-PCR (RSV, FLU A&B, COVID)  RVPGX2
Influenza A by PCR: NEGATIVE
Influenza B by PCR: NEGATIVE
Resp Syncytial Virus by PCR: NEGATIVE
SARS Coronavirus 2 by RT PCR: NEGATIVE

## 2023-07-24 LAB — SALICYLATE LEVEL: Salicylate Lvl: <7 mg/dL — ABNORMAL LOW (ref 7.0–30.0)

## 2023-07-24 LAB — GLUCOSE, CAPILLARY
Glucose-Capillary: 230 mg/dL — ABNORMAL HIGH (ref 70–99)
Glucose-Capillary: 185 mg/dL — ABNORMAL HIGH (ref 70–99)
Glucose-Capillary: 237 mg/dL — ABNORMAL HIGH (ref 70–99)
Glucose-Capillary: 195 mg/dL — ABNORMAL HIGH (ref 70–99)

## 2023-07-24 LAB — PHOSPHORUS: Phosphorus: 2 mg/dL — ABNORMAL LOW (ref 2.5–4.6)

## 2023-07-24 LAB — CBC
HCT: 37.4 % — ABNORMAL LOW (ref 39.0–52.0)
Hemoglobin: 12.8 g/dL — ABNORMAL LOW (ref 13.0–17.0)
MCH: 28.5 pg (ref 26.0–34.0)
MCHC: 34.2 g/dL (ref 30.0–36.0)
MCV: 83.3 fL (ref 80.0–100.0)
Platelets: 168 10*3/uL (ref 150–400)
RBC: 4.49 MIL/uL (ref 4.22–5.81)
RDW: 13.2 % (ref 11.5–15.5)
WBC: 8.3 10*3/uL (ref 4.0–10.5)
nRBC: 0 % (ref 0.0–0.2)

## 2023-07-24 LAB — LIPASE, BLOOD: Lipase: 44 U/L (ref 11–51)

## 2023-07-24 LAB — DIGOXIN LEVEL: Digoxin Level: 1 ng/mL (ref 0.8–2.0)

## 2023-07-24 LAB — BETA-HYDROXYBUTYRIC ACID: Beta-Hydroxybutyric Acid: 0.73 mmol/L — ABNORMAL HIGH (ref 0.05–0.27)

## 2023-07-24 LAB — LACTIC ACID, PLASMA: Lactic Acid, Venous: 2.6 mmol/L (ref 0.5–1.9)

## 2023-07-24 LAB — MAGNESIUM: Magnesium: 1.8 mg/dL (ref 1.7–2.4)

## 2023-07-24 MED ORDER — INSULIN ASPART 100 UNIT/ML IJ SOLN
0.0000 [IU] | Freq: Three times a day (TID) | INTRAMUSCULAR | Status: DC
  Administered 2023-07-24 (×2): 2 [IU] via SUBCUTANEOUS
  Administered 2023-07-24 – 2023-07-25 (×2): 1 [IU] via SUBCUTANEOUS

## 2023-07-24 MED ORDER — SODIUM CHLORIDE 0.9 % IV BOLUS
500.0000 mL | Freq: Once | INTRAVENOUS | Status: AC
  Administered 2023-07-24: 500 mL via INTRAVENOUS

## 2023-07-24 MED ORDER — DIGOXIN 125 MCG PO TABS
125.0000 ug | ORAL_TABLET | Freq: Every day | ORAL | Status: DC
Start: 2023-07-24 — End: 2023-07-24

## 2023-07-24 MED ORDER — SODIUM CHLORIDE 0.9 % IV BOLUS
1000.0000 mL | Freq: Once | INTRAVENOUS | Status: AC
  Administered 2023-07-24: 1000 mL via INTRAVENOUS

## 2023-07-24 MED ORDER — EZETIMIBE 10 MG PO TABS
10.0000 mg | ORAL_TABLET | Freq: Every day | ORAL | Status: DC
  Administered 2023-07-24: 10 mg via ORAL
  Filled 2023-07-24: qty 1

## 2023-07-24 MED ORDER — BUSPIRONE HCL 10 MG PO TABS
20.0000 mg | ORAL_TABLET | Freq: Two times a day (BID) | ORAL | Status: DC
  Administered 2023-07-24 – 2023-07-25 (×3): 20 mg via ORAL
  Filled 2023-07-24 (×3): qty 2

## 2023-07-24 MED ORDER — ONDANSETRON HCL 4 MG/2ML IJ SOLN
4.0000 mg | Freq: Once | INTRAMUSCULAR | Status: AC
  Administered 2023-07-24: 4 mg via INTRAVENOUS
  Filled 2023-07-24: qty 2

## 2023-07-24 MED ORDER — INSULIN GLARGINE-YFGN 100 UNIT/ML ~~LOC~~ SOLN
20.0000 [IU] | Freq: Every day | SUBCUTANEOUS | Status: DC
  Administered 2023-07-24 – 2023-07-25 (×2): 20 [IU] via SUBCUTANEOUS
  Filled 2023-07-24 (×5): qty 0.2

## 2023-07-24 MED ORDER — ENOXAPARIN SODIUM 40 MG/0.4ML IJ SOSY
40.0000 mg | PREFILLED_SYRINGE | INTRAMUSCULAR | Status: DC
  Administered 2023-07-24 – 2023-07-25 (×2): 40 mg via SUBCUTANEOUS
  Filled 2023-07-24 (×2): qty 0.4

## 2023-07-24 MED ORDER — ACETAMINOPHEN 500 MG PO TABS
1000.0000 mg | ORAL_TABLET | Freq: Four times a day (QID) | ORAL | Status: DC | PRN
Start: 2023-07-24 — End: 2023-07-25

## 2023-07-24 MED ORDER — MELATONIN 3 MG PO TABS
6.0000 mg | ORAL_TABLET | Freq: Every evening | ORAL | Status: DC | PRN
  Administered 2023-07-24: 6 mg via ORAL
  Filled 2023-07-24: qty 2

## 2023-07-24 MED ORDER — PROCHLORPERAZINE EDISYLATE 10 MG/2ML IJ SOLN
10.0000 mg | Freq: Four times a day (QID) | INTRAMUSCULAR | Status: DC | PRN
  Administered 2023-07-24: 10 mg via INTRAVENOUS
  Filled 2023-07-24: qty 2

## 2023-07-24 MED ORDER — VORTIOXETINE HBR 20 MG PO TABS
20.0000 mg | ORAL_TABLET | Freq: Every day | ORAL | Status: DC
  Administered 2023-07-24 – 2023-07-25 (×2): 20 mg via ORAL
  Filled 2023-07-24 (×2): qty 1

## 2023-07-24 MED ORDER — PREGABALIN 75 MG PO CAPS
75.0000 mg | ORAL_CAPSULE | Freq: Three times a day (TID) | ORAL | Status: DC
  Administered 2023-07-24 – 2023-07-25 (×4): 75 mg via ORAL
  Filled 2023-07-24 (×4): qty 1

## 2023-07-24 MED ORDER — OXYCODONE HCL 5 MG PO TABS
15.0000 mg | ORAL_TABLET | Freq: Every day | ORAL | Status: DC | PRN
  Administered 2023-07-24: 15 mg via ORAL
  Filled 2023-07-24: qty 3

## 2023-07-24 MED ORDER — PANTOPRAZOLE SODIUM 40 MG PO TBEC
40.0000 mg | DELAYED_RELEASE_TABLET | Freq: Every day | ORAL | Status: DC
  Administered 2023-07-24 – 2023-07-25 (×2): 40 mg via ORAL
  Filled 2023-07-24 (×2): qty 1

## 2023-07-24 MED ORDER — MIDODRINE HCL 5 MG PO TABS: 2.5000 mg | ORAL_TABLET | Freq: Three times a day (TID) | ORAL | Status: DC

## 2023-07-24 MED ORDER — ONDANSETRON HCL 4 MG/2ML IJ SOLN
4.0000 mg | Freq: Four times a day (QID) | INTRAMUSCULAR | Status: DC | PRN
  Administered 2023-07-24: 4 mg via INTRAVENOUS
  Filled 2023-07-24: qty 2

## 2023-07-24 NOTE — ED Provider Notes (Signed)
 [place] [facility] AT [facility] Provider Note   CSN: [id number] Arrival date & time: 07/24/23  0000     History  Chief Complaint  Patient presents with   Emesis    Greg Esparza is a 58 y.o. male.  The history is provided by the patient, the EMS personnel and medical records.  Emesis Greg Esparza is a 58 y.o. male who presents to the Emergency Department complaining of nausea, vomiting, diarrhea.  He presents the emergency department by EMS for evaluation of symptoms that started around 1 PM.  He has been experiencing numerous episodes of emesis that is green in color, mild diarrhea.  He has intermittent cold sweats.  Son was sick last week with diarrhea.  He has no associated abdominal pain.  No fevers, chest pain, difficulty breathing.  He does feel globally weak.  No recent medication changes or missed medications.  He has a history of diabetes, CHF, ICD placement    Home Medications Prior to Admission medications   Medication Sig Start Date End Date Taking? Authorizing Provider  busPIRone (BUSPAR) 10 MG tablet TAKE 2 TABLETS BY MOUTH 2 TIMES DAILY. 04/08/23  Yes Sirivol, Mamatha, MD  Continuous Glucose Sensor (FREESTYLE LIBRE 3 SENSOR) MISC 1 each by Other route every 14 (fourteen) days. 05/04/23  Yes Craft, [name], PA  digoxin (LANOXIN) 0.125 MG tablet TAKE 1 TABLET BY MOUTH EVERY DAY 02/26/23  Yes Craft, [name], PA  esomeprazole (NEXIUM) 40 MG capsule Take 40 mg by mouth daily as needed (acid reflux). 10/18/17  Yes [provider]  Evolocumab (REPATHA SURECLICK) 140 MG/ML SOAJ Inject 140 mg into the skin every 14 (fourteen) days. 05/04/23  Yes Craft, [name], PA  ezetimibe (ZETIA) 10 MG tablet TAKE 1 TABLET BY MOUTH EVERY DAY 06/04/23  Yes Craft, [place], PA  JARDIANCE 25 MG TABS tablet Take 1 tablet (25 mg total) by mouth daily. Patient taking differently: Take 12.5 mg by mouth daily. 03/16/23  Yes Craft, [name], PA  LANTUS SOLOSTAR 100 UNIT/ML  Solostar Pen Inject 40 Units into the skin daily. Patient taking differently: Inject 40 Units into the skin daily. 30-15pm 05/04/23  Yes Craft, [name], PA  linaclotide (LINZESS) 290 MCG CAPS capsule Take 290 mcg by mouth daily as needed (constipation).   Yes [provider]  midodrine (PROAMATINE) 2.5 MG tablet TAKE 1 TABLET (2.5 MG TOTAL) BY MOUTH 3 (THREE) TIMES DAILY WITH MEALS. Patient taking differently: Take 2.5 mg by mouth daily. 07/19/23  Yes Craft, [name], PA  mupirocin ointment (BACTROBAN) 2 % Apply 1 Application topically daily as needed (wound care). 05/04/23  Yes Craft, [name], PA  oxyCODONE (ROXICODONE) 15 MG immediate release tablet Take 15 mg by mouth every 4 (four) hours as needed for pain.  12/22/17  Yes [provider]  pregabalin (LYRICA) 75 MG capsule Take 75 mg by mouth 3 (three) times daily. 02/08/23  Yes [provider]  RYBELSUS 14 MG TABS TAKE 1 TABLET (14 MG TOTAL) BY MOUTH DAILY Patient taking differently: Take 7 tablets by mouth daily. 07/02/23  Yes Cox, Kirsten, MD  TRINTELLIX 20 MG TABS tablet Take 1 tablet (20 mg total) by mouth daily. 05/03/23  Yes Cox, Kirsten, MD  Vitamin D, Ergocalciferol, (DRISDOL) 50000 units CAPS capsule Take 50,000 Units by mouth every Tuesday. 12/05/17  Yes [provider]  Blood Glucose Monitoring Suppl DEVI 1 each by Does not apply route in the morning, at noon, and at bedtime. May substitute to any  manufacturer covered by [employer]. 06/15/23   [name], PA  Continuous Glucose Receiver (FREESTYLE LIBRE 3 READER) DEVI 1 (ONE) EACH AS DIRECTED 12/02/22   [provider]  Glucagon (GVOKE HYPOPEN 1-PACK) 1 MG/0.2ML SOAJ Inject 1 Pen into the skin as directed. Patient not taking: Reported on 06/22/2023 03/12/23   [name], PA  naloxone [facility]) nasal spray 4 mg/0.1 mL INSTILL 1 DOSE SQUIRTED IN NOSTRIL FOR EXCESSIVE SEDATION, REPEAT IN 3 MINUTES IF NO IMPROVEMENT Patient not taking: Reported on  06/30/2023 11/03/19   [provider]      Allergies    Lipitor [atorvastatin], Oxymorphone, Gabapentin, Lisinopril, Morphine, and Penicillin g    Review of Systems   Review of Systems  Gastrointestinal:  Positive for vomiting.  All other systems reviewed and are negative.   Physical Exam Updated Vital Signs BP (!) 135/59 (BP Location: Left Arm)   Pulse 78   Temp 97.6 F (36.4 C)   Resp 20   Ht 5\' 8"  (1.727 m)   Wt 82.8 kg Comment: Wt from 07/03/2023  SpO2 100%   BMI 27.76 kg/m  Physical Exam Vitals and nursing note reviewed.  Constitutional:      Appearance: He is well-developed.  HENT:     Head: Normocephalic and atraumatic.  Cardiovascular:     Rate and Rhythm: Normal rate and regular rhythm.  Pulmonary:     Effort: Pulmonary effort is normal. No respiratory distress.     Breath sounds: Normal breath sounds.  Abdominal:     Palpations: Abdomen is soft.     Tenderness: There is no abdominal tenderness. There is no guarding or rebound.  Musculoskeletal:        General: No tenderness.  Skin:    General: Skin is warm and dry.  Neurological:     Mental Status: He is alert and oriented to person, place, and time.     Comments: 5 out of 5 strength in all 4 extremities  Psychiatric:        Behavior: Behavior normal.     ED Results / Procedures / Treatments   Labs (all labs ordered are listed, but only abnormal results are displayed) Labs Reviewed  COMPREHENSIVE METABOLIC PANEL WITH GFR - Abnormal; Notable for the following components:      Result Value   Potassium 5.2 (*)    CO2 18 (*)    Glucose, Bld 272 (*)    BUN 31 (*)    Creatinine, Ser 2.39 (*)    Total Protein 8.5 (*)    GFR, Estimated 31 (*)    Anion gap 20 (*)    All other components within normal limits  URINALYSIS, ROUTINE W REFLEX MICROSCOPIC - Abnormal; Notable for the following components:   Glucose, UA >=500 (*)    Ketones, ur 20 (*)    Protein, ur 30 (*)    All other components within  normal limits  BETA-HYDROXYBUTYRIC ACID - Abnormal; Notable for the following components:   Beta-Hydroxybutyric Acid 0.73 (*)    All other components within normal limits  CBC - Abnormal; Notable for the following components:   Hemoglobin 12.8 (*)    HCT 37.4 (*)    All other components within normal limits  I-STAT VENOUS BLOOD GAS, ED - Abnormal; Notable for the following components:   pH, Ven 7.523 (*)    pCO2, Ven 23.5 (*)    Bicarbonate 19.3 (*)    TCO2 20 (*)    Calcium, Ion 1.03 (*)  HCT 35.0 (*)    Hemoglobin 11.9 (*)    All other components within normal limits  RESP PANEL BY RT-PCR (RSV, FLU A&B, COVID)  RVPGX2  RESPIRATORY PANEL BY PCR  GASTROINTESTINAL PANEL BY PCR, STOOL (REPLACES STOOL CULTURE)  CBC WITH DIFFERENTIAL/PLATELET  LIPASE, BLOOD  DIGOXIN LEVEL  BASIC METABOLIC PANEL WITH GFR  MAGNESIUM  PHOSPHORUS  SALICYLATE LEVEL  LACTIC ACID, PLASMA    EKG EKG Interpretation Date/Time:  Saturday July 24 2023 00:20:51 EDT Ventricular Rate:  77 PR Interval:  165 QRS Duration:  133 QT Interval:  411 QTC Calculation: 466 R Axis:   0  Text Interpretation: Electronic ventricular pacemaker Confirmed by Tilden Fossa [phone]) on 07/24/2023 1:16:17 AM  Radiology No results found.  Procedures Procedures    Medications Ordered in ED Medications  enoxaparin (LOVENOX) injection 40 mg (has no administration in time range)  acetaminophen (TYLENOL) tablet 1,000 mg (has no administration in time range)  melatonin tablet 6 mg (has no administration in time range)  ondansetron (ZOFRAN) injection 4 mg (4 mg Intravenous Given 07/24/23 0552)  prochlorperazine (COMPAZINE) injection 10 mg (has no administration in time range)  busPIRone (BUSPAR) tablet 20 mg (has no administration in time range)  pantoprazole (PROTONIX) EC tablet 40 mg (has no administration in time range)  ezetimibe (ZETIA) tablet 10 mg (has no administration in time range)  insulin glargine-yfgn  (SEMGLEE) injection 20 Units (has no administration in time range)  insulin aspart (novoLOG) injection 0-6 Units (has no administration in time range)  oxyCODONE (Oxy IR/ROXICODONE) immediate release tablet 15 mg (has no administration in time range)  pregabalin (LYRICA) capsule 75 mg (has no administration in time range)  vortioxetine HBr (TRINTELLIX) tablet 20 mg (has no administration in time range)  ondansetron (ZOFRAN) injection 4 mg (4 mg Intravenous Given 07/24/23 0017)  sodium chloride 0.9 % bolus 500 mL (0 mLs Intravenous Stopped 07/24/23 0141)  ondansetron (ZOFRAN) injection 4 mg (4 mg Intravenous Given 07/24/23 0250)  sodium chloride 0.9 % bolus 1,000 mL (1,000 mLs Intravenous New Bag/Given 07/24/23 0555)    ED Course/ Medical Decision Making/ A&P                                 Medical Decision Making Amount and/or Complexity of Data Reviewed Labs: ordered.  Risk Prescription drug management. Decision regarding hospitalization.   Patient with history of diabetes, CKD, cardiomyopathy here for evaluation of vomiting, diarrhea.  Patient with multiple episodes of emesis prior to ED arrival as well as in the emergency department despite antiemetics.  Labs with mild worsening of his renal function.  He does have hyperglycemia, decreased bicarb with elevated anion gap but elevated pH.  No evidence of DKA at this time.  UA is not consistent with UTI.  Given intractable nausea and vomiting plan to admit for ongoing care.  No evidence of acute bacterial infection.  Presentation is not consistent with SBO.        Final Clinical Impression(s) / ED Diagnoses Final diagnoses:  Dehydration  Uncontrollable vomiting    Rx / DC Orders ED Discharge Orders     None         Tilden Fossa, MD 07/24/23 [phone]

## 2023-07-24 NOTE — ED Notes (Signed)
 This nurse called CCMD to register patient for monitoring.

## 2023-07-24 NOTE — H&P (Signed)
 History and Physical    Greg Esparza HYQ:[id number] DOB: July 21, 1965 DOA: 07/24/2023  PCP: [name], PA   Patient coming from: Home   Chief Complaint:  Chief Complaint  Patient presents with   Emesis    HPI:  Greg Esparza is a 58 y.o. male with hx of heart failure with improved ejection fraction, BiV ICD, ? Chronic hypotension, diabetes type 2, hyperlipidemia, CKD 3B, RCC s/p right nephrectomy, chronic pain, who presents due to persistent nausea vomiting and diarrhea.  Reports symptom onset yesterday around 1 PM with nausea and has had about 20+ episodes of emesis.  Mainly regurgitated food or drink for things he has tried to get down.  Also having few episodes of loose/watery stools.  And otherwise having chills and subjective fever.  His son was sick last week with GI symptoms in addition to cough.    Review of Systems:  ROS complete and negative except as marked above   Allergies  Allergen Reactions   Lipitor [Atorvastatin] Other (See Comments)    Muscle pain   Oxymorphone Other (See Comments)    Urinary retention   Gabapentin Other (See Comments)   Lisinopril Cough    cough   Morphine Nausea And Vomiting   Penicillin G Rash    Prior to Admission medications   Medication Sig Start Date End Date Taking? Authorizing Provider  Blood Glucose Monitoring Suppl DEVI 1 each by Does not apply route in the morning, at noon, and at bedtime. May substitute to any manufacturer covered by patient's insurance. 06/15/23   [name], PA  busPIRone (BUSPAR) 10 MG tablet TAKE 2 TABLETS BY MOUTH 2 TIMES DAILY. 04/08/23   Windell Moment, MD  Continuous Glucose Receiver (FREESTYLE LIBRE 3 READER) DEVI 1 (ONE) EACH AS DIRECTED 12/02/22   [provider]  Continuous Glucose Sensor (FREESTYLE LIBRE 3 SENSOR) MISC 1 each by Other route every 14 (fourteen) days. 05/04/23   [name], PA  digoxin (LANOXIN) 0.125 MG tablet TAKE 1 TABLET BY MOUTH EVERY DAY 02/26/23   [name],  PA  esomeprazole (NEXIUM) 40 MG capsule Take 40 mg by mouth daily as needed (acid reflux). 10/18/17   [provider]  Evolocumab (REPATHA SURECLICK) 140 MG/ML SOAJ Inject 140 mg into the skin every 14 (fourteen) days. 05/04/23   [name], PA  ezetimibe (ZETIA) 10 MG tablet TAKE 1 TABLET BY MOUTH EVERY DAY 06/04/23   [name], PA  Glucagon (GVOKE HYPOPEN 1-PACK) 1 MG/0.2ML SOAJ Inject 1 Pen into the skin as directed. Patient not taking: Reported on 06/22/2023 03/12/23   [name], PA  JARDIANCE 25 MG TABS tablet Take 1 tablet (25 mg total) by mouth daily. Patient taking differently: Take 12.5 mg by mouth daily. 03/16/23   [name], PA  LANTUS SOLOSTAR 100 UNIT/ML Solostar Pen Inject 40 Units into the skin daily. 05/04/23   [name], PA  linaclotide (LINZESS) 290 MCG CAPS capsule Take 290 mcg by mouth daily as needed (constipation).    [provider]  midodrine (PROAMATINE) 2.5 MG tablet TAKE 1 TABLET (2.5 MG TOTAL) BY MOUTH 3 (THREE) TIMES DAILY WITH MEALS. 07/19/23   [name], PA  mupirocin ointment (BACTROBAN) 2 % Apply 1 Application topically daily as needed (wound care). 05/04/23   [name], PA  naloxone [facility]) nasal spray 4 mg/0.1 mL INSTILL 1 DOSE SQUIRTED IN NOSTRIL FOR EXCESSIVE SEDATION, REPEAT IN 3 MINUTES IF NO IMPROVEMENT Patient not taking: Reported on 06/30/2023 11/03/19  [provider]  oxyCODONE (ROXICODONE) 15 MG immediate release tablet Take 15 mg by mouth every 4 (four) hours as needed for pain.  12/22/17   [provider]  pregabalin (LYRICA) 75 MG capsule Take 75 mg by mouth 3 (three) times daily. 02/08/23   [provider]  RYBELSUS 14 MG TABS TAKE 1 TABLET (14 MG TOTAL) BY MOUTH DAILY 07/02/23   Cox, Kirsten, MD  TRINTELLIX 20 MG TABS tablet Take 1 tablet (20 mg total) by mouth daily. 05/03/23   Blane Ohara, MD  Vitamin D, Ergocalciferol, (DRISDOL) 50000 units CAPS capsule Take 50,000 Units by mouth every Tuesday.  12/05/17   [provider]    Past Medical History:  Diagnosis Date   AICD (automatic cardioverter/defibrillator) present    Anxiety    Arthritis    Cardiac defibrillator in situ    Cardiomyopathy (HCC)    CHF (congestive heart failure) (HCC)    Chronic lower back pain    Chronic pain syndrome    Chronic systolic (congestive) heart failure (HCC)    CKD (chronic kidney disease)    Depression    Diabetes (HCC)    Diabetic neuropathy (HCC)    GERD (gastroesophageal reflux disease)    History of kidney cancer    Hypotension    Myocardial infarction [facility])     Past Surgical History:  Procedure Laterality Date   BACK SURGERY     CARDIAC DEFIBRILLATOR PLACEMENT     In Situ   KIDNEY CYST REMOVAL Right    WISDOM TOOTH EXTRACTION       reports that he has quit smoking. His smoking use included cigarettes. He has never been exposed to tobacco smoke. He has never used smokeless tobacco. He reports that he does not drink alcohol and does not use drugs.  Family History  Problem Relation Age of Onset   Heart failure Mother    Diabetes Mother    Diabetes Sister    Diabetes Brother    Diabetes Brother    Heart failure Maternal Grandmother    Diabetes Maternal Grandmother    Heart failure Maternal Grandfather    Diabetes Maternal Grandfather    Cancer Paternal Grandmother        unknown     Physical Exam: Vitals:   07/24/23 0345 07/24/23 0400 07/24/23 0415 07/24/23 0430  BP: (!) 151/68 (!) 154/75 (!) 153/70 (!) 160/67  Pulse: 76 81 78 75  Resp: 15 20 20 20   Temp:      TempSrc:      SpO2: 100% 100% 100% 100%  Weight:    82.8 kg    Gen: Awake, alert, uncomfortable appearing CV: Regular, normal S1, S2, no murmurs  Resp: Normal WOB, frequent sighing / mild tachypnea. CTAB  Abd: Flat, normoactive, nontender MSK: Symmetric, no edema  Skin: No rashes or lesions to exposed skin  Neuro: Alert and interactive  Psych: euthymic, appropriate    Data review:   Labs  reviewed, notable for:   VBG 7.52/23, bicarb 18, AG 20  K5.2, creatinine 2.39 (baseline ~2)  Blood counts unremarkable Beta hydroxybutyrate 0.7 UA positive ketones  Micro:  Results for orders placed or performed during the hospital encounter of 07/24/23  Resp panel by RT-PCR (RSV, Flu A&B, Covid) Anterior Nasal Swab     Status: None   Collection Time: 07/24/23 12:06 AM   Specimen: Anterior Nasal Swab  Result Value Ref Range Status   SARS Coronavirus 2 by RT PCR NEGATIVE NEGATIVE Final  Influenza A by PCR NEGATIVE NEGATIVE Final   Influenza B by PCR NEGATIVE NEGATIVE Final    Comment: (NOTE) The Xpert Xpress SARS-CoV-2/FLU/RSV plus assay is intended as an aid in the diagnosis of influenza from Nasopharyngeal swab specimens and should not be used as a sole basis for treatment. Nasal washings and aspirates are unacceptable for Xpert Xpress SARS-CoV-2/FLU/RSV testing.  Fact Sheet for Patients: BloggerCourse.com  Fact Sheet for Healthcare Providers: SeriousBroker.it  This test is not yet approved or cleared by the [place] FDA and has been authorized for detection and/or diagnosis of SARS-CoV-2 by FDA under an Emergency Use Authorization (EUA). This EUA will remain in effect (meaning this test can be used) for the duration of the COVID-19 declaration under Section 564(b)(1) of the Act, 21 U.S.C. section 360bbb-3(b)(1), unless the authorization is terminated or revoked.     Resp Syncytial Virus by PCR NEGATIVE NEGATIVE Final    Comment: (NOTE) Fact Sheet for Patients: BloggerCourse.com  Fact Sheet for Healthcare Providers: SeriousBroker.it  This test is not yet approved or cleared by the [place] FDA and has been authorized for detection and/or diagnosis of SARS-CoV-2 by FDA under an Emergency Use Authorization (EUA). This EUA will remain in effect (meaning this  test can be used) for the duration of the COVID-19 declaration under Section 564(b)(1) of the Act, 21 U.S.C. section 360bbb-3(b)(1), unless the authorization is terminated or revoked.  Performed at [facility], [address], [place], [place] [address]     Imaging reviewed:  No results found.  EKG:  Personally reviewed V paced  ED Course:  Treated with Zofran, 500 cc IV fluid   Assessment/Plan:  58 y.o. male with hx heart failure with improved ejection fraction, BiV ICD, ? Chronic hypotension, diabetes type 2, hyperlipidemia, CKD 3B, RCC s/p right nephrectomy, chronic pain, who is admitted for observation with gastroenteritis and intractable vomiting.    Gastroenteritis Intractable vomiting Likely starvation ketoacidosis  Acute onset of N/V/D and 1 PM yesterday.  Positive sick contact with son who had LRI and GI symptoms last week. Labs with ketoacidosis with beta hydroxybutyrate 0.7, mixed respiratory alkalosis and AGMA, minimal elevation in Cr without AKI, and mild hyperkalemia. digoxin nml although was supposed to stop after last visit with cardiology. Flu/COVID/RSV neg KUB normal. Suspected viral cause.  -Check GI pathogen to rule out norovirus, check RVP -Supportive care Zofran first-line, Compazine second line -S/p 500 cc IV fluid, given additional 1 L of IV fluid if unable to tolerate p.o.'s would start on gentle maintenance fluid -Enteric precautions until GI pathogen result  -Clear liquid diet, advance as tolerates  Mixed acid base, respiratory alkalosis, AGMA  Appears to have hyperventilation in the setting of his discomfort from nausea. AGMA likely 2/2 lactic acidosis, GI losses.  - Check lactate - Trend salicylate although less likely - IV fluids per above  Mild hyperkalemia CKD stage IIIb  Baseline creatinine approximately 2, minimally elevated at 2.39 on admission.  Mild hypokalemia at 5.2.  Likely prerenal in setting of GI loss. -Low K diet persistent  hypokalemia.  Chronic medical problems:  Heart failure with improved ejection fraction, BiV ICD: Without acute exacerbation.  Discontinue home digoxin as planned per last cardiology visit.  Hold home Jardiance in setting of volume depletion. ?Chronic hypotension: Appears has been on midodrine although hypertensive indication.  Hold some midodrine for now.  Diabetes type 2: Home regimen is Lantus 30 units in the morning, 20 units in the evening, Rybelsus, Jardiance.  Reduce basal insulin in setting of decreased p.o. intake to Semglee 20 units in the morning, SSI for very sensitive while inpatient. Hyperlipidemia: Continue home Zetia.  Also on Repatha outpatient. History of RCC s/p right nephrectomy: Outpatient follow-up Chronic pain: Continue home Lyrica, oxycodone 15 mg 5 times daily as needed for severe Mood disorder: Continue home buspirone, vortioxetine.    Body mass index is 27.76 kg/m.    DVT prophylaxis:  Lovenox Code Status:  Full Code Diet:  Diet Orders (From admission, onward)     Start     Ordered   07/24/23 0456  Diet clear liquid Room service appropriate? Yes; Fluid consistency: Thin  Diet effective now       Question Answer Comment  Room service appropriate? Yes   Fluid consistency: Thin      07/24/23 0457           Family Communication:  No   Consults:  None   Admission status:   Observation, Med-Surg  Severity of Illness: The appropriate patient status for this patient is OBSERVATION. Observation status is judged to be reasonable and necessary in order to provide the required intensity of service to ensure the patient's safety. The patient's presenting symptoms, physical exam findings, and initial radiographic and laboratory data in the context of their medical condition is felt to place them at decreased risk for further clinical deterioration. Furthermore, it is anticipated that the patient will be medically stable for discharge from the hospital within 2  midnights of admission.    Dolly Rias, MD [facility]  How to contact the [facility] Attending or Consulting provider 7A - 7P or covering provider during after hours 7P -7A, for this patient.  Check the care team in [facility] and look for a) attending/consulting [facility] provider listed and b) the [facility] team listed Log into [email or web address] and use [place] universal password to access. If you do not have the password, please contact the hospital operator. Locate the [facility] provider you are looking for under [facility] and page to a number that you can be directly reached. If you still have difficulty reaching the provider, please page the [facility] (Director on Call) for the Hospitalists listed on amion for assistance.  07/24/2023, 5:02 AM

## 2023-07-24 NOTE — Progress Notes (Signed)
 60 y wm Known right renal mass status post right nephrectomy 05/07/2023, prior 1.2 cm tubular adenoma status post HFrEF followed at [facility] Dr. Jeanne Ivan with secondary biventricular defibrillator placed 2009 and upgraded subsequently-with improvement in EF to 55%--?  Some mention of fracture right ventricular lead DM TY 2 HLD Previous right foot ulcer by podiatry Underlying chronic pain  Presented to ED 3/29 with flulike symptoms nausea vomiting diarrhea since 3/20-intermittent sweats-family illness with son having similar symptoms WBC blood sugar is elevated 270-potassium 5.2 BUN/creatinine above baseline of 27/1.9-31/2.3 LFTs normal Respiratory viral panel negative-GI pathogen panel pending Mild lactic acidosis 2.0  Awake coherent sleepy no distress no nausea no vomiting right now tolerating clears no diarrhea  EOMI NCAT no focal deficit Chest clear Abdomen soft No lower extremity   Acidosis likely related to starvation ketosis and AKI superimposed on CKD 3 with hyperkalemia and is resolving-would hold IV fluids for now as no longer vomiting and has chronic systolic heart failure-he is not on any diuretics at home or ACE ARB As he has not had diarrhea feel that this is self-limiting and would watch for recurrence but will allow diet--nursing is aware to discontinue the GI pathogen panel order if no diarrhea He can resume digoxin 125 mcg daily He has chronic hypotension and midodrine has been held as he is hypotensive but he was taking this only once daily We resumed all his other meds including his psych meds His CBGs are elevated in the 220 range and he will get half his usual insulin Lantus 20 units and very sensitive sliding scale I spoke to his significant other today and anticipate he can probably discharge a.m. if he remains stable  No charge

## 2023-07-24 NOTE — ED Notes (Signed)
 Patient given water as a PO challenge.

## 2023-07-24 NOTE — ED Notes (Signed)
 ED TO INPATIENT HANDOFF REPORT  ED Nurse Name and Phone #: Marcie Bal RN [phone]  S Name/Age/Gender Greg Esparza 58 y.o. male Room/Bed: 035C/035C  Code Status   Code Status: Full Code  Home/SNF/Other Home Patient oriented to: self, place, time, and situation Is this baseline? Yes   Triage Complete: Triage complete  Chief Complaint Gastroenteritis [K52.9]  Triage Note Patient coming from home with flu-like symptoms. Complains of N/V/D since around 1300 07/23/2023. Took some meds for symptoms but unknown what meds. EMS VS 154/72 BP 80 HR 100% RA No pain 258 CBG Hx of pacemaker and diabetes   Allergies Allergies  Allergen Reactions   Lipitor [Atorvastatin] Other (See Comments)    Muscle pain   Oxymorphone Other (See Comments)    Urinary retention   Gabapentin Other (See Comments)   Lisinopril Cough    cough   Morphine Nausea And Vomiting   Penicillin G Rash    Level of Care/Admitting Diagnosis ED Disposition     ED Disposition  Admit   Condition  --   Comment  Hospital Area: [facility] [[id number]]  Level of Care: Med-Surg [16]  May place patient in observation at [facility] or [facility] if equivalent level of care is available:: No  Covid Evaluation: Asymptomatic - no recent exposure (last 10 days) testing not required  Diagnosis: Gastroenteritis [454098]  Admitting Physician: Dolly Rias [[id number]]  Attending Physician: Dolly Rias [[id number]]          B Medical/Surgery History Past Medical History:  Diagnosis Date   AICD (automatic cardioverter/defibrillator) present    Anxiety    Arthritis    Cardiac defibrillator in situ    Cardiomyopathy (HCC)    CHF (congestive heart failure) (HCC)    Chronic lower back pain    Chronic pain syndrome    Chronic systolic (congestive) heart failure (HCC)    CKD (chronic kidney disease)    Depression    Diabetes (HCC)    Diabetic neuropathy (HCC)    GERD (gastroesophageal  reflux disease)    History of kidney cancer    Hypotension    Myocardial infarction [facility])    Past Surgical History:  Procedure Laterality Date   BACK SURGERY     CARDIAC DEFIBRILLATOR PLACEMENT     In Situ   KIDNEY CYST REMOVAL Right    WISDOM TOOTH EXTRACTION       A IV Location/Drains/Wounds Patient Lines/Drains/Airways Status     Active Line/Drains/Airways     Name Placement date Placement time Site Days   Peripheral IV 07/24/23 20 G 1" Anterior;Right Forearm 07/24/23  0016  Forearm  less than 1   Incision - 3 Ports Abdomen 1: Right;Lateral;Upper 2: Right;Lateral;Lower 3: Right;Posterior;Upper 05/07/23  1200  -- 78            Intake/Output Last 24 hours No intake or output data in the 24 hours ending 07/24/23 0515  Labs/Imaging Results for orders placed or performed during the hospital encounter of 07/24/23 (from the past 48 hours)  Resp panel by RT-PCR (RSV, Flu A&B, Covid) Anterior Nasal Swab     Status: None   Collection Time: 07/24/23 12:06 AM   Specimen: Anterior Nasal Swab  Result Value Ref Range   SARS Coronavirus 2 by RT PCR NEGATIVE NEGATIVE   Influenza A by PCR NEGATIVE NEGATIVE   Influenza B by PCR NEGATIVE NEGATIVE    Comment: (NOTE) The Xpert Xpress SARS-CoV-2/FLU/RSV plus assay is intended as an aid in  the diagnosis of influenza from Nasopharyngeal swab specimens and should not be used as a sole basis for treatment. Nasal washings and aspirates are unacceptable for Xpert Xpress SARS-CoV-2/FLU/RSV testing.  Fact Sheet for Patients: BloggerCourse.com  Fact Sheet for Healthcare Providers: SeriousBroker.it  This test is not yet approved or cleared by the [place] FDA and has been authorized for detection and/or diagnosis of SARS-CoV-2 by FDA under an Emergency Use Authorization (EUA). This EUA will remain in effect (meaning this test can be used) for the duration of the COVID-19 declaration  under Section 564(b)(1) of the Act, 21 U.S.C. section 360bbb-3(b)(1), unless the authorization is terminated or revoked.     Resp Syncytial Virus by PCR NEGATIVE NEGATIVE    Comment: (NOTE) Fact Sheet for Patients: BloggerCourse.com  Fact Sheet for Healthcare Providers: SeriousBroker.it  This test is not yet approved or cleared by the [place] FDA and has been authorized for detection and/or diagnosis of SARS-CoV-2 by FDA under an Emergency Use Authorization (EUA). This EUA will remain in effect (meaning this test can be used) for the duration of the COVID-19 declaration under Section 564(b)(1) of the Act, 21 U.S.C. section 360bbb-3(b)(1), unless the authorization is terminated or revoked.  Performed at [facility], [address] [place], [place] [address]   Comprehensive metabolic panel     Status: Abnormal   Collection Time: 07/24/23 12:09 AM  Result Value Ref Range   Sodium 139 135 - 145 mmol/L   Potassium 5.2 (H) 3.5 - 5.1 mmol/L   Chloride 101 98 - 111 mmol/L   CO2 18 (L) 22 - 32 mmol/L   Glucose, Bld 272 (H) 70 - 99 mg/dL    Comment: Glucose reference range applies only to samples taken after fasting for at least 8 hours.   BUN 31 (H) 6 - 20 mg/dL   Creatinine, Ser 6.04 (H) 0.61 - 1.24 mg/dL   Calcium 9.9 8.9 - 54.0 mg/dL   Total Protein 8.5 (H) 6.5 - 8.1 g/dL   Albumin 4.4 3.5 - 5.0 g/dL   AST 33 15 - 41 U/L   ALT 31 0 - 44 U/L   Alkaline Phosphatase 45 38 - 126 U/L   Total Bilirubin 1.2 0.0 - 1.2 mg/dL   GFR, Estimated 31 (L) >60 mL/min    Comment: (NOTE) Calculated using the CKD-EPI Creatinine Equation (2021)    Anion gap 20 (H) 5 - 15    Comment: Performed at [facility], [address] [place], [place] [address]  CBC with Differential     Status: None   Collection Time: 07/24/23 12:09 AM  Result Value Ref Range   WBC 8.6 4.0 - 10.5 K/uL   RBC 4.95 4.22 - 5.81 MIL/uL   Hemoglobin 14.0  13.0 - 17.0 g/dL   HCT 14.7 82.9 - 56.2 %   MCV 82.4 80.0 - 100.0 fL   MCH 28.3 26.0 - 34.0 pg   MCHC 34.3 30.0 - 36.0 g/dL   RDW 13.0 86.5 - 78.4 %   Platelets 182 150 - 400 K/uL   nRBC 0.0 0.0 - 0.2 %   Neutrophils Relative % 80 %   Neutro Abs 6.9 1.7 - 7.7 K/uL   Lymphocytes Relative 11 %   Lymphs Abs 0.9 0.7 - 4.0 K/uL   Monocytes Relative 9 %   Monocytes Absolute 0.7 0.1 - 1.0 K/uL   Eosinophils Relative 0 %   Eosinophils Absolute 0.0 0.0 - 0.5 K/uL  Basophils Relative 0 %   Basophils Absolute 0.0 0.0 - 0.1 K/uL   Immature Granulocytes 0 %   Abs Immature Granulocytes 0.03 0.00 - 0.07 K/uL    Comment: Performed at [facility], [address] [place], [place] [address]  Lipase, blood     Status: None   Collection Time: 07/24/23 12:09 AM  Result Value Ref Range   Lipase 44 11 - 51 U/L    Comment: Performed at [facility], [address] [place], [place] [address]  Urinalysis, Routine w reflex microscopic -Urine, Clean Catch     Status: Abnormal   Collection Time: 07/24/23 12:15 AM  Result Value Ref Range   Color, Urine YELLOW YELLOW   APPearance CLEAR CLEAR   Specific Gravity, Urine 1.021 1.005 - 1.030   pH 7.0 5.0 - 8.0   Glucose, UA >=500 (A) NEGATIVE mg/dL   Hgb urine dipstick NEGATIVE NEGATIVE   Bilirubin Urine NEGATIVE NEGATIVE   Ketones, ur 20 (A) NEGATIVE mg/dL   Protein, ur 30 (A) NEGATIVE mg/dL   Nitrite NEGATIVE NEGATIVE   Leukocytes,Ua NEGATIVE NEGATIVE   RBC / HPF 0-5 0 - 5 RBC/hpf   WBC, UA 0-5 0 - 5 WBC/hpf   Bacteria, UA NONE SEEN NONE SEEN   Squamous Epithelial / HPF 0-5 0 - 5 /HPF    Comment: Performed at [facility], [address], [place], [place] [address]  Digoxin level     Status: None   Collection Time: 07/24/23  1:15 AM  Result Value Ref Range   Digoxin Level 1.0 0.8 - 2.0 ng/mL    Comment: Performed at [facility], [address], [place], [place] [address]  Beta-hydroxybutyric acid     Status: Abnormal    Collection Time: 07/24/23  1:35 AM  Result Value Ref Range   Beta-Hydroxybutyric Acid 0.73 (H) 0.05 - 0.27 mmol/L    Comment: Performed at [facility], [address], [place], [place] [address]  I-Stat venous blood gas, ED     Status: Abnormal   Collection Time: 07/24/23  1:43 AM  Result Value Ref Range   pH, Ven 7.523 (H) 7.25 - 7.43   pCO2, Ven 23.5 (L) 44 - 60 mmHg   pO2, Ven 33 32 - 45 mmHg   Bicarbonate 19.3 (L) 20.0 - 28.0 mmol/L   TCO2 20 (L) 22 - 32 mmol/L   O2 Saturation 73 %   Acid-base deficit 2.0 0.0 - 2.0 mmol/L   Sodium 139 135 - 145 mmol/L   Potassium 4.7 3.5 - 5.1 mmol/L   Calcium, Ion 1.03 (L) 1.15 - 1.40 mmol/L   HCT 35.0 (L) 39.0 - 52.0 %   Hemoglobin 11.9 (L) 13.0 - 17.0 g/dL   Sample type VENOUS    Comment NOTIFIED PHYSICIAN    No results found.  Pending Labs Unresulted Labs (From admission, onward)     Start     Ordered   07/25/23 0500  HIV Antibody (routine testing w rflx)  (HIV Antibody (Routine testing w reflex) panel)  Tomorrow morning,   R        07/24/23 0457   07/24/23 0500  Basic metabolic panel with GFR  Tomorrow morning,   R        07/24/23 0457   07/24/23 0500  CBC  Tomorrow morning,   R        07/24/23 0457   07/24/23 0500  Magnesium  Tomorrow morning,  R        07/24/23 0457   07/24/23 0500  Phosphorus  Tomorrow morning,   R        07/24/23 0457   07/24/23 0453  Respiratory (~20 pathogens) panel by PCR  (Respiratory panel by PCR (~20 pathogens, ~24 hr TAT)  w precautions)  Once,   URGENT        07/24/23 0452   07/24/23 0453  Gastrointestinal Panel by PCR , Stool  (Gastrointestinal Panel by PCR, Stool                                                                                                                                                     **Does Not include CLOSTRIDIUM DIFFICILE testing. **If CDIFF testing is needed, place order from the "C Difficile Testing" order set.**)  Once,   URGENT        07/24/23 0452             Vitals/Pain Today's Vitals   07/24/23 0400 07/24/23 0415 07/24/23 0430 07/24/23 0506  BP: (!) 154/75 (!) 153/70 (!) 160/67   Pulse: 81 78 75   Resp: 20 20 20    Temp:    98 F (36.7 C)  TempSrc:    Oral  SpO2: 100% 100% 100%   Weight:   82.8 kg   PainSc:        Isolation Precautions Enteric precautions (UV disinfection)  Medications Medications  enoxaparin (LOVENOX) injection 40 mg (has no administration in time range)  sodium chloride 0.9 % bolus 1,000 mL (has no administration in time range)  acetaminophen (TYLENOL) tablet 1,000 mg (has no administration in time range)  melatonin tablet 6 mg (has no administration in time range)  ondansetron (ZOFRAN) injection 4 mg (has no administration in time range)  prochlorperazine (COMPAZINE) injection 10 mg (has no administration in time range)  busPIRone (BUSPAR) tablet 20 mg (has no administration in time range)  pantoprazole (PROTONIX) EC tablet 40 mg (has no administration in time range)  ezetimibe (ZETIA) tablet 10 mg (has no administration in time range)  insulin glargine-yfgn (SEMGLEE) injection 20 Units (has no administration in time range)  insulin aspart (novoLOG) injection 0-6 Units (has no administration in time range)  midodrine (PROAMATINE) tablet 2.5 mg (has no administration in time range)  oxyCODONE (Oxy IR/ROXICODONE) immediate release tablet 15 mg (has no administration in time range)  pregabalin (LYRICA) capsule 75 mg (has no administration in time range)  vortioxetine HBr (TRINTELLIX) tablet 20 mg (has no administration in time range)  ondansetron (ZOFRAN) injection 4 mg (4 mg Intravenous Given 07/24/23 0017)  sodium chloride 0.9 % bolus 500 mL (0 mLs Intravenous Stopped 07/24/23 0141)  ondansetron (ZOFRAN) injection 4 mg (4 mg Intravenous Given 07/24/23 0250)    Mobility walks     Focused Assessments Nausea/vomiting/diarrhea  R Recommendations: See Admitting Provider Note  Report given to:    Additional Notes:

## 2023-07-24 NOTE — ED Notes (Signed)
 Greg Esparza said he was going outside to sit in the truck and to call him if needed.

## 2023-07-24 NOTE — Evaluation (Signed)
 Physical Therapy Evaluation Patient Details Name: Greg Esparza MRN: [id number] DOB: [birth year] Today's Date: 07/24/2023  History of Present Illness  The pt is a 58 yo male presenting 3/29 with vomiting. Admitted for management of gastroenteritis and ketoacidosis. PMH includes: CHF, ICD, hypotension, DM II, HLD, CKD III, RCC s/p right nephrectomy, and chronic pain.  Clinical Impression  Pt typically independent, taking care of his adult son with disabilities (more set-up for ADLs and medication management than physical assistance), living in a home with 3 steps to enter and no hx of falls or use of DME. The pt presents today with fatigue, but was able to complete bed mobility, sit-stand transfers, gait in room, and eventually standing marches without UE support. He did have one minor LOB when in SLS position, but was able to correct without assist. Anticipate pt will continue to improve endurance and stability as he recovers without need for follow up therapies or new DME. Will follow acutely to maintain mobility, but pt is safe to d/c without additional PT sessions once medically stable.       If plan is discharge home, recommend the following: Assistance with cooking/housework   Can travel by private vehicle        Equipment Recommendations None recommended by PT  Recommendations for Other Services       Functional Status Assessment Patient has not had a recent decline in their functional status     Precautions / Restrictions Precautions Precautions: None Recall of Precautions/Restrictions: Intact Restrictions Weight Bearing Restrictions Per Provider Order: No      Mobility  Bed Mobility Overal bed mobility: Independent             General bed mobility comments: increased time    Transfers Overall transfer level: Needs assistance Equipment used: None Transfers: Sit to/from Stand Sit to Stand: Supervision           General transfer comment: supervision for  line management only    Ambulation/Gait Ambulation/Gait assistance: Supervision Gait Distance (Feet): 45 Feet Assistive device: None Gait Pattern/deviations: Step-through pattern, Decreased stride length, Trunk flexed Gait velocity: decreased Gait velocity interpretation: <1.31 ft/sec, indicative of household ambulator   General Gait Details: slow but stable without DME      Balance Overall balance assessment: Mild deficits observed, not formally tested, Needs assistance Sitting-balance support: No upper extremity supported, Feet supported Sitting balance-Leahy Scale: Good     Standing balance support: No upper extremity supported, During functional activity Standing balance-Leahy Scale: Good           Rhomberg - Eyes Opened: 20 Rhomberg - Eyes Closed: 20 (increased sway)                 Pertinent Vitals/Pain Pain Assessment Pain Assessment: No/denies pain    Home Living Family/patient expects to be discharged to:: Private residence Living Arrangements: Children Available Help at Discharge: Family;Available PRN/intermittently Type of Home: House Home Access: Stairs to enter Entrance Stairs-Rails: Right;Left;Can reach both Entrance Stairs-Number of Steps: 3   Home Layout: One level Home Equipment: Agricultural consultant (2 wheels);BSC/3in1;Shower seat;Grab bars - tub/shower      Prior Function Prior Level of Function : Independent/Modified Independent;Driving             Mobility Comments: independent, lives with adult disabled son ADLs Comments: indpendent     Extremity/Trunk Assessment   Upper Extremity Assessment Upper Extremity Assessment: Overall WFL for tasks assessed    Lower Extremity Assessment Lower Extremity Assessment: Overall WFL for  tasks assessed    Cervical / Trunk Assessment Cervical / Trunk Assessment: Kyphotic  Communication   Communication Communication: No apparent difficulties    Cognition Arousal: Alert Behavior During  Therapy: WFL for tasks assessed/performed   PT - Cognitive impairments: No apparent impairments                         Following commands: Intact       Cueing Cueing Techniques: Verbal cues     General Comments General comments (skin integrity, edema, etc.): VSS, pt nausea unchanged. asking for breakfast upon PT departure    Exercises Other Exercises Other Exercises: standing marches x8 with good balance and no UE support   Assessment/Plan    PT Assessment Patient needs continued PT services  PT Problem List Decreased activity tolerance;Decreased balance       PT Treatment Interventions Gait training;Functional mobility training;Therapeutic activities;Therapeutic exercise;Stair training;Balance training    PT Goals (Current goals can be found in the Care Plan section)  Acute Rehab PT Goals Patient Stated Goal: feel better and stop throwing up PT Goal Formulation: With patient Time For Goal Achievement: 08/06/23 Potential to Achieve Goals: Good    Frequency Min 1X/week        AM-PAC PT "6 Clicks" Mobility  Outcome Measure Help needed turning from your back to your side while in a flat bed without using bedrails?: None Help needed moving from lying on your back to sitting on the side of a flat bed without using bedrails?: None Help needed moving to and from a bed to a chair (including a wheelchair)?: A Little Help needed standing up from a chair using your arms (e.g., wheelchair or bedside chair)?: A Little Help needed to walk in hospital room?: A Little Help needed climbing 3-5 steps with a railing? : A Little 6 Click Score: 20    End of Session   Activity Tolerance: Patient tolerated treatment well;Patient limited by fatigue Patient left: in bed;with call bell/phone within reach Nurse Communication: Mobility status PT Visit Diagnosis: Unsteadiness on feet (R26.81)    Time: 4098-1191 PT Time Calculation (min) (ACUTE ONLY): 21 min   Charges:   PT  Evaluation $PT Eval Low Complexity: 1 Low   PT General Charges $$ ACUTE PT VISIT: 1 Visit         Vickki Muff, PT, DPT   Acute Rehabilitation Department Office [phone] Secure Chat Communication Preferred  Ronnie Derby 07/24/2023, 11:36 AM

## 2023-07-24 NOTE — ED Triage Notes (Signed)
 Patient coming from home with flu-like symptoms. Complains of N/V/D since around 1300 07/23/2023. Took some meds for symptoms but unknown what meds. EMS VS 154/72 BP 80 HR 100% RA No pain 258 CBG Hx of pacemaker and diabetes

## 2023-07-24 NOTE — ED Notes (Signed)
 Ambulated pt to restroom, pt unable to provide stool sample

## 2023-07-25 DIAGNOSIS — K529 Noninfective gastroenteritis and colitis, unspecified: Secondary | ICD-10-CM | POA: Diagnosis not present

## 2023-07-25 LAB — GASTROINTESTINAL PANEL BY PCR, STOOL (REPLACES STOOL CULTURE)

## 2023-07-25 LAB — BASIC METABOLIC PANEL WITH GFR
Anion gap: 10 (ref 5–15)
BUN: 35 mg/dL — ABNORMAL HIGH (ref 6–20)
CO2: 24 mmol/L (ref 22–32)
Calcium: 8.9 mg/dL (ref 8.9–10.3)
Chloride: 104 mmol/L (ref 98–111)
Creatinine, Ser: 2.4 mg/dL — ABNORMAL HIGH (ref 0.61–1.24)
GFR, Estimated: 31 mL/min — ABNORMAL LOW (ref >60)
Glucose, Bld: 183 mg/dL — ABNORMAL HIGH (ref 70–99)
Potassium: 4.4 mmol/L (ref 3.5–5.1)
Sodium: 138 mmol/L (ref 135–145)

## 2023-07-25 LAB — GLUCOSE, CAPILLARY: Glucose-Capillary: 159 mg/dL — ABNORMAL HIGH (ref 70–99)

## 2023-07-25 LAB — HIV ANTIBODY (ROUTINE TESTING W REFLEX): HIV Screen 4th Generation wRfx: NONREACTIVE

## 2023-07-25 MED ORDER — LOPERAMIDE HCL 2 MG PO CAPS: 2.0000 mg | ORAL_CAPSULE | ORAL | 0 refills | Status: AC | PRN

## 2023-07-25 MED ORDER — LANTUS SOLOSTAR 100 UNIT/ML ~~LOC~~ SOPN: 30.0000 [IU] | PEN_INJECTOR | Freq: Every day | SUBCUTANEOUS | 6 refills | Status: AC

## 2023-07-25 MED ORDER — LOPERAMIDE HCL 2 MG PO CAPS
2.0000 mg | ORAL_CAPSULE | ORAL | Status: DC | PRN
  Administered 2023-07-25: 2 mg via ORAL
  Filled 2023-07-25: qty 1

## 2023-07-25 NOTE — Progress Notes (Signed)
  RN informed that GI panel is norovirus positive.  Per chart review patient has been admitted for intractable nausea vomiting and viral gastroenteritis.  As there is no risk for C. difficile infection it has not been checked initially. Ordered Imodium as needed for diarrhea.  Tereasa Coop, MD [facility] 07/25/2023, 1:55 AM

## 2023-07-25 NOTE — Progress Notes (Signed)
 1610 Patient A&Ox4. Follows commands. On room air. Vitals stable. No complaints of pain. Lungs clear. S1 and S2 noted, regular. Moves all extremities well. Continent of bowel and bladder. Skin intact. No voiced complaints at this time other than ready for discharge.    9604 Patient with discharge paperwork and order. This RN went over upcoming appointments, medications and education.Patient made aware of where to pick up medications and to stop taking Linzess, Decrease Lantus to 30units daily and begin dosage tomorrow since already received dose this a.m. and take immodium as needed. Patient reported understood education by repeating education back to this RN. No other questions or concerns reported from patient. Patient awaiting ride pick up after finishing breakfast.    PIV was removed with cath intact, covered with tape and dry dressing.

## 2023-07-25 NOTE — Plan of Care (Signed)
  Problem: Fluid Volume: Goal: Ability to maintain a balanced intake and output will improve Outcome: Progressing   Problem: Metabolic: Goal: Ability to maintain appropriate glucose levels will improve Outcome: Progressing   Problem: Clinical Measurements: Goal: Respiratory complications will improve Outcome: Progressing   Problem: Elimination: Goal: Will not experience complications related to bowel motility Outcome: Progressing

## 2023-07-25 NOTE — Discharge Summary (Signed)
 Physician Discharge Summary  Greg Esparza NWG:[id number] DOB: 1965/06/15 DOA: 07/24/2023  PCP: [name], PA  Admit date: 07/24/2023 Discharge date: 07/25/2023  Time spent: 46 minutes  Recommendations for Outpatient Follow-up:  Requires Chem-7 CBC 1 week Requires outpatient follow-up with cardiology/psychiatry Should follow-up with gastroenterology for large 1.2 cm polyp in the outpatient setting  Discharge Diagnoses:  MAIN problem for hospitalization   Norovirus colitis  Please see below for itemized issues addressed in HOpsital- refer to other progress notes for clarity if needed  Discharge Condition: Improved  Diet recommendation: Heart healthy diabetic  Filed Weights   07/24/23 0430  Weight: 82.8 kg    History of present illness:  45 y wm Known right renal mass status post right nephrectomy 05/07/2023, prior 1.2 cm tubular adenoma status post HFrEF followed at [facility] Dr. Jeanne Ivan with secondary biventricular defibrillator placed 2009 and upgraded subsequently-with improvement in EF to 55%--?  Some mention of fracture right ventricular lead DM TY 2 HLD Previous right foot ulcer by podiatry Underlying chronic pain   Presented to ED 3/29 with flulike symptoms nausea vomiting diarrhea since 3/20-intermittent sweats-family illness with son having similar symptoms WBC blood sugar is elevated 270-potassium 5.2 BUN/creatinine above baseline of 27/1.9-31/2.3 LFTs normal Respiratory viral panel negative-GI pathogen panel pending Mild lactic acidosis 2.0   Patient had a combination of starvation ketosis and hyperkalemia on admission and was IV resuscitated on arrival He had persistent diarrhea the first day of hospitalization He was found to be norovirus positive and was started on Imodium His diarrhea seemed to resolve he was able to keep down diet/fluids and hydrate himself Given the fact that he has a history of renal cell CA status post nephrectomy he may benefit  from discussion of eventual dialysis as an outpatient Would get labs in about 1 week I have resumed most of his meds without change other than cutting back his long-acting insulin He should get labs in a week    Discharge Exam: Vitals:   07/25/23 0537 07/25/23 0743  BP: 102/64 101/61  Pulse: 64 61  Resp: 18   Temp: 98.4 F (36.9 C) 98.7 F (37.1 C)  SpO2: 98% 98%    Subj on day of d/c   Much more awake alert coherent no distress  General Exam on discharge  EOMI NCAT no focal deficit no icterus no pallor no wheeze no rales no rhonchi S1-S2 no murmur Chest clear no added sound Pacemaker in place Abdomen soft no rebound No lower extremity edema  Discharge Instructions   Discharge Instructions     Diet - low sodium heart healthy   Complete by: As directed    Discharge instructions   Complete by: As directed    This hospital stay you were diagnosed with an infection in your gut from a virus called norovirus-this is not an invasive or dangerous bug but usually self-limited-I would discontinue your Linzess and use I Imodium tablets which I have prescribed for you to help you decrease the amount of diarrhea that you have It would be a good idea for you to get labs in about a week-looking at your meds the only main change I would make is to cut back your long-acting insulin to 30 units-you can resume your Jardiance and other medications and keep a close check on your sugars Should you have high-grade fevers chills nausea vomiting or persistent diarrhea please come back either to urgent care or the emergency room and get some labs  Increase activity slowly   Complete by: As directed       Allergies as of 07/25/2023       Reactions   Lipitor [atorvastatin] Other (See Comments)   Muscle pain   Oxymorphone Other (See Comments)   Urinary retention   Gabapentin Other (See Comments)   Unknown reaction   Lisinopril Cough   cough   Morphine Nausea And Vomiting   Penicillin G  Rash        Medication List     STOP taking these medications    linaclotide 290 MCG Caps capsule Commonly known as: LINZESS       TAKE these medications    Blood Glucose Monitoring Suppl Devi 1 each by Does not apply route in the morning, at noon, and at bedtime. May substitute to any manufacturer covered by patient's insurance.   busPIRone 10 MG tablet Commonly known as: BUSPAR TAKE 2 TABLETS BY MOUTH 2 TIMES DAILY.   digoxin 0.125 MG tablet Commonly known as: LANOXIN TAKE 1 TABLET BY MOUTH EVERY DAY   esomeprazole 40 MG capsule Commonly known as: NEXIUM Take 40 mg by mouth daily as needed (acid reflux).   ezetimibe 10 MG tablet Commonly known as: ZETIA TAKE 1 TABLET BY MOUTH EVERY DAY   FreeStyle Libre 3 Reader Devi 1 (ONE) EACH AS DIRECTED   FreeStyle Libre 3 Sensor Misc 1 each by Other route every 14 (fourteen) days.   Gvoke HypoPen 1-Pack 1 MG/0.2ML Soaj Generic drug: Glucagon Inject 1 Pen into the skin as directed.   Jardiance 25 MG Tabs tablet Generic drug: empagliflozin Take 1 tablet (25 mg total) by mouth daily. What changed: how much to take   Lantus SoloStar 100 UNIT/ML Solostar Pen Generic drug: insulin glargine Inject 30 Units into the skin daily. What changed: how much to take   loperamide 2 MG capsule Commonly known as: IMODIUM Take 1 capsule (2 mg total) by mouth as needed for diarrhea or loose stools.   midodrine 2.5 MG tablet Commonly known as: PROAMATINE TAKE 1 TABLET (2.5 MG TOTAL) BY MOUTH 3 (THREE) TIMES DAILY WITH MEALS. What changed: when to take this   mupirocin ointment 2 % Commonly known as: BACTROBAN Apply 1 Application topically daily as needed (wound care).   Narcan 4 MG/0.1ML Liqd nasal spray kit Generic drug: naloxone INSTILL 1 DOSE SQUIRTED IN NOSTRIL FOR EXCESSIVE SEDATION, REPEAT IN 3 MINUTES IF NO IMPROVEMENT   oxyCODONE 15 MG immediate release tablet Commonly known as: ROXICODONE Take 15 mg by mouth  every 4 (four) hours as needed for pain.   pregabalin 75 MG capsule Commonly known as: LYRICA Take 75 mg by mouth 3 (three) times daily.   Repatha SureClick 140 MG/ML Soaj Generic drug: Evolocumab Inject 140 mg into the skin every 14 (fourteen) days.   Rybelsus 14 MG Tabs Generic drug: Semaglutide TAKE 1 TABLET (14 MG TOTAL) BY MOUTH DAILY What changed: how much to take   Trintellix 20 MG Tabs tablet Generic drug: vortioxetine HBr Take 1 tablet (20 mg total) by mouth daily.   Vitamin D (Ergocalciferol) 1.25 MG (50000 UNIT) Caps capsule Commonly known as: DRISDOL Take 50,000 Units by mouth every Tuesday.       Allergies  Allergen Reactions   Lipitor [Atorvastatin] Other (See Comments)    Muscle pain   Oxymorphone Other (See Comments)    Urinary retention   Gabapentin Other (See Comments)    Unknown reaction   Lisinopril Cough    cough  Morphine Nausea And Vomiting   Penicillin G Rash      The results of significant diagnostics from this hospitalization (including imaging, microbiology, ancillary and laboratory) are listed below for reference.    Significant Diagnostic Studies: DG Abd 1 View Result Date: 07/18/2023 CLINICAL DATA:  evaluate stool burden and for obstruction Chronic constipation, EXAM: ABDOMEN - 1 VIEW COMPARISON:  None Available. FINDINGS: The bowel gas pattern is normal. Stool noted throughout the colon. No increased stool burden. No radio-opaque calculi or other significant radiographic abnormality are seen. L4-S1 surgical hardware fusion. IMPRESSION: Nonobstructive bowel gas pattern. Electronically Signed   By: Tish Frederickson M.D.   On: 07/18/2023 20:41   DG Chest 2 View Result Date: 07/18/2023 CLINICAL DATA:  cough, DOE, hypertension EXAM: CHEST - 2 VIEW COMPARISON:  None Available. FINDINGS: Left chest wall cardiac pacemaker and defibrillator. The heart and mediastinal contours are within normal limits. No focal consolidation. No pulmonary edema.  No pleural effusion. No pneumothorax. No acute osseous abnormality. IMPRESSION: No active cardiopulmonary disease. Electronically Signed   By: Tish Frederickson M.D.   On: 07/18/2023 20:41    Microbiology: Recent Results (from the past 240 hours)  Resp panel by RT-PCR (RSV, Flu A&B, Covid) Anterior Nasal Swab     Status: None   Collection Time: 07/24/23 12:06 AM   Specimen: Anterior Nasal Swab  Result Value Ref Range Status   SARS Coronavirus 2 by RT PCR NEGATIVE NEGATIVE Final   Influenza A by PCR NEGATIVE NEGATIVE Final   Influenza B by PCR NEGATIVE NEGATIVE Final    Comment: (NOTE) The Xpert Xpress SARS-CoV-2/FLU/RSV plus assay is intended as an aid in the diagnosis of influenza from Nasopharyngeal swab specimens and should not be used as a sole basis for treatment. Nasal washings and aspirates are unacceptable for Xpert Xpress SARS-CoV-2/FLU/RSV testing.  Fact Sheet for Patients: BloggerCourse.com  Fact Sheet for Healthcare Providers: SeriousBroker.it  This test is not yet approved or cleared by the [place] FDA and has been authorized for detection and/or diagnosis of SARS-CoV-2 by FDA under an Emergency Use Authorization (EUA). This EUA will remain in effect (meaning this test can be used) for the duration of the COVID-19 declaration under Section 564(b)(1) of the Act, 21 U.S.C. section 360bbb-3(b)(1), unless the authorization is terminated or revoked.     Resp Syncytial Virus by PCR NEGATIVE NEGATIVE Final    Comment: (NOTE) Fact Sheet for Patients: BloggerCourse.com  Fact Sheet for Healthcare Providers: SeriousBroker.it  This test is not yet approved or cleared by the [place] FDA and has been authorized for detection and/or diagnosis of SARS-CoV-2 by FDA under an Emergency Use Authorization (EUA). This EUA will remain in effect (meaning this test can be used)  for the duration of the COVID-19 declaration under Section 564(b)(1) of the Act, 21 U.S.C. section 360bbb-3(b)(1), unless the authorization is terminated or revoked.  Performed at [facility], [address], [place], [place] [address]   Gastrointestinal Panel by PCR , Stool     Status: Abnormal   Collection Time: 07/24/23  4:53 AM   Specimen: Stool  Result Value Ref Range Status   Campylobacter species NOT DETECTED NOT DETECTED Final   Plesimonas shigelloides NOT DETECTED NOT DETECTED Final   Salmonella species NOT DETECTED NOT DETECTED Final   Yersinia enterocolitica NOT DETECTED NOT DETECTED Final   Vibrio species NOT DETECTED NOT DETECTED Final   Vibrio cholerae NOT DETECTED NOT DETECTED Final   Enteroaggregative E coli (EAEC)  NOT DETECTED NOT DETECTED Final   Enteropathogenic E coli (EPEC) NOT DETECTED NOT DETECTED Final   Enterotoxigenic E coli (ETEC) NOT DETECTED NOT DETECTED Final   Shiga like toxin producing E coli (STEC) NOT DETECTED NOT DETECTED Final   Shigella/Enteroinvasive E coli (EIEC) NOT DETECTED NOT DETECTED Final   Cryptosporidium NOT DETECTED NOT DETECTED Final   Cyclospora cayetanensis NOT DETECTED NOT DETECTED Final   Entamoeba histolytica NOT DETECTED NOT DETECTED Final   Giardia lamblia NOT DETECTED NOT DETECTED Final   Adenovirus F40/41 NOT DETECTED NOT DETECTED Final   Astrovirus NOT DETECTED NOT DETECTED Final   Norovirus GI/GII DETECTED (A) NOT DETECTED Final    Comment: RESULT CALLED TO, READ BACK BY AND VERIFIED WITH: ASHLEIGH WRIGHT @ 07/25/23 0147 AB    Rotavirus A NOT DETECTED NOT DETECTED Final   Sapovirus (I, II, IV, and V) NOT DETECTED NOT DETECTED Final    Comment: Performed at [facility], [address] [place], [place] [address]  Respiratory (~20 pathogens) panel by PCR     Status: None   Collection Time: 07/24/23  6:05 AM   Specimen: Nasopharyngeal Swab; Respiratory  Result Value Ref Range Status   Adenovirus NOT  DETECTED NOT DETECTED Final   Coronavirus 229E NOT DETECTED NOT DETECTED Final    Comment: (NOTE) The Coronavirus on the Respiratory Panel, DOES NOT test for the novel  Coronavirus (2019 nCoV)    Coronavirus HKU1 NOT DETECTED NOT DETECTED Final   Coronavirus NL63 NOT DETECTED NOT DETECTED Final   Coronavirus OC43 NOT DETECTED NOT DETECTED Final   Metapneumovirus NOT DETECTED NOT DETECTED Final   Rhinovirus / Enterovirus NOT DETECTED NOT DETECTED Final   Influenza A NOT DETECTED NOT DETECTED Final   Influenza B NOT DETECTED NOT DETECTED Final   Parainfluenza Virus 1 NOT DETECTED NOT DETECTED Final   Parainfluenza Virus 2 NOT DETECTED NOT DETECTED Final   Parainfluenza Virus 3 NOT DETECTED NOT DETECTED Final   Parainfluenza Virus 4 NOT DETECTED NOT DETECTED Final   Respiratory Syncytial Virus NOT DETECTED NOT DETECTED Final   Bordetella pertussis NOT DETECTED NOT DETECTED Final   Bordetella Parapertussis NOT DETECTED NOT DETECTED Final   Chlamydophila pneumoniae NOT DETECTED NOT DETECTED Final   Mycoplasma pneumoniae NOT DETECTED NOT DETECTED Final    Comment: Performed at [facility], [address], [place], [place] [address]     Labs: Basic Metabolic Panel: Recent Labs  Lab 07/24/23 0009 07/24/23 0143 07/24/23 0537 07/25/23 0548  NA 139 139 137 138  K 5.2* 4.7 4.9 4.4  CL 101  --  103 104  CO2 18*  --  20* 24  GLUCOSE 272*  --  298* 183*  BUN 31*  --  32* 35*  CREATININE 2.39*  --  2.31* 2.40*  CALCIUM 9.9  --  9.6 8.9  MG  --   --  1.8  --   PHOS  --   --  2.0*  --    Liver Function Tests: Recent Labs  Lab 07/24/23 0009  AST 33  ALT 31  ALKPHOS 45  BILITOT 1.2  PROT 8.5*  ALBUMIN 4.4   Recent Labs  Lab 07/24/23 0009  LIPASE 44   No results for input(s): "AMMONIA" in the last 168 hours. CBC: Recent Labs  Lab 07/24/23 0009 07/24/23 0143 07/24/23 0537  WBC 8.6  --  8.3  NEUTROABS 6.9  --   --   HGB 14.0 11.9* 12.8*  HCT  40.8 35.0* 37.4*   MCV 82.4  --  83.3  PLT 182  --  168   Cardiac Enzymes: No results for input(s): "CKTOTAL", "CKMB", "CKMBINDEX", "TROPONINI" in the last 168 hours. BNP: BNP (last 3 results) No results for input(s): "BNP" in the last 8760 hours.  ProBNP (last 3 results) No results for input(s): "PROBNP" in the last 8760 hours.  CBG: Recent Labs  Lab 07/24/23 0835 07/24/23 1200 07/24/23 1649 07/24/23 2022 07/25/23 0744  GLUCAP 230* 185* 237* 195* 159*    Signed:  Rhetta Mura MD   [facility] 07/25/2023, 8:53 AM

## 2023-07-27 ENCOUNTER — Other Ambulatory Visit

## 2023-07-30 ENCOUNTER — Encounter: Payer: Self-pay | Admitting: "Endocrinology

## 2023-07-30 ENCOUNTER — Other Ambulatory Visit: Payer: Self-pay | Admitting: "Endocrinology

## 2023-07-30 ENCOUNTER — Ambulatory Visit: Admitting: "Endocrinology

## 2023-07-30 VITALS — BP 110/80 | HR 72 | Ht 68.0 in | Wt 176.0 lb

## 2023-07-30 DIAGNOSIS — E1165 Type 2 diabetes mellitus with hyperglycemia: Secondary | ICD-10-CM | POA: Diagnosis not present

## 2023-07-30 DIAGNOSIS — Z794 Long term (current) use of insulin: Secondary | ICD-10-CM

## 2023-07-30 DIAGNOSIS — E782 Mixed hyperlipidemia: Secondary | ICD-10-CM

## 2023-07-30 DIAGNOSIS — Z7984 Long term (current) use of oral hypoglycemic drugs: Secondary | ICD-10-CM

## 2023-07-30 MED ORDER — INSULIN LISPRO (1 UNIT DIAL) 100 UNIT/ML (KWIKPEN): 5.0000 [IU] | PEN_INJECTOR | Freq: Three times a day (TID) | SUBCUTANEOUS | 3 refills | Status: AC

## 2023-07-30 NOTE — Progress Notes (Signed)
 Outpatient Endocrinology Note Greg [place], MD  07/30/23   LEVANTE SIMONES 1965/06/25 [id number]  Referring Provider: [name], PA Primary Care Provider: [name], PA Reason for consultation: Subjective   Assessment & Plan  Diagnoses and all orders for this visit:  Uncontrolled type 2 diabetes mellitus with hyperglycemia (HCC) -     Ambulatory referral to diabetic education  Long-term insulin use (HCC)  Other orders -     insulin lispro (HUMALOG KWIKPEN) 100 UNIT/ML KwikPen; Inject 5-10 Units into the skin 3 (three) times daily. 15 min before meals    Diabetes Type II complicated by neuropathy, nephropathy, defibrillator,  Lab Results  Component Value Date   GFR 37.24 (L) 06/30/2023   Hba1c goal less than 7, current Hba1c is  Lab Results  Component Value Date   HGBA1C 8.8 (H) 06/22/2023   Will recommend the following: Lantus 30 units qam  Jardiance 25 mg every day half a pill (per cardiologist, due to dizziness needing midodrine) Rybelsus 14 mg everyday half a pill (per PA due to nausea, pt will attempt 14 mg every day again to see tolerance)  Humalog 5 units three times a day 15 min before meals, go up by 1 unit every day until blood sugar 2 hours after meals is close to 150    No known contraindications/side effects to any of above medications Has Glucagon - discussed use on 07/30/23    -Last LD and Tg are as follows: Lab Results  Component Value Date   LDLCALC 70 06/22/2023    Lab Results  Component Value Date   TRIG 367 (H) 06/22/2023   -On repatha and zetia, not on statin due to aches  -Follow low fat diet and exercise   -Blood pressure goal <140/90 - Microalbumin/creatinine goal is < 30 -Last MA/Cr is as follows: No results found for: "MICROALBUR", "MALB24HUR" -not on ACE/ARB -diet changes including salt restriction -limit eating outside -counseled BP targets per standards of diabetes care -uncontrolled blood pressure can lead to  retinopathy, nephropathy and cardiovascular and atherosclerotic heart disease  Reviewed and counseled on: -A1C target -Blood sugar targets -Complications of uncontrolled diabetes  -Checking blood sugar before meals and bedtime and bring log next visit -All medications with mechanism of action and side effects -Hypoglycemia management: rule of 15's, Glucagon Emergency Kit and medical alert ID -low-carb low-fat plate-method diet -At least 20 minutes of physical activity per day -Annual dilated retinal eye exam and foot exam -compliance and follow up needs -follow up as scheduled or earlier if problem gets worse  Call if blood sugar is less than 70 or consistently above 250    Take a 15 gm snack of carbohydrate at bedtime before you go to sleep if your blood sugar is less than 100.    If you are going to fast after midnight for a test or procedure, ask your physician for instructions on how to reduce/decrease your insulin dose.    Call if blood sugar is less than 70 or consistently above 250  -Treating a low sugar by rule of 15  (15 gms of sugar every 15 min until sugar is more than 70) If you feel your sugar is low, test your sugar to be sure If your sugar is low (less than 70), then take 15 grams of a fast acting Carbohydrate (3-4 glucose tablets or glucose gel or 4 ounces of juice or regular soda) Recheck your sugar 15 min after treating low to make sure it  is more than 70 If sugar is still less than 70, treat again with 15 grams of carbohydrate          Don't drive the hour of hypoglycemia  If unconscious/unable to eat or drink by mouth, use glucagon injection or nasal spray baqsimi and call 911. Can repeat again in 15 min if still unconscious.  Return in about 4 weeks (around 08/27/2023).   I have reviewed current medications, nurse's notes, allergies, vital signs, past medical and surgical history, family medical history, and social history for this encounter. Counseled patient on  symptoms, examination findings, lab findings, imaging results, treatment decisions and monitoring and prognosis. The patient understood the recommendations and agrees with the treatment plan. All questions regarding treatment plan were fully answered.  Greg Huntley, MD  07/30/23    History of Present Illness Greg Esparza is a 58 y.o. year old male who presents for evaluation of Type II diabetes mellitus.  Greg Esparza was first diagnosed around 2005.   Diabetes education +  Home diabetes regimen: Lantus 30 units qam  Jardiance 25 mg every day half a pill  Rybelsus 14 mg everyday half a pill   COMPLICATIONS +  MI/Stroke, has a defibrillator  -  retinopathy +  neuropathy +  nephropathy  SYMPTOMS REVIEWED + Polyuria - Weight loss - Blurred vision  BLOOD SUGAR DATA  CGM interpretation: At today's visit, we reviewed her CGM downloads. The full report is scanned in the media. Reviewing the CGM trends, BG are elevated post lunch and dinner.   Physical Exam  BP 110/80   Pulse 72   Ht 5\' 8"  (1.727 m)   Wt 176 lb (79.8 kg)   SpO2 99%   BMI 26.76 kg/m    Constitutional: well developed, well nourished Head: normocephalic, atraumatic Eyes: sclera anicteric, no redness Neck: supple Lungs: normal respiratory effort Neurology: alert and oriented Skin: dry, no appreciable rashes Musculoskeletal: no appreciable defects Psychiatric: normal mood and affect Diabetic Foot Exam - Simple   No data filed      Current Medications Patient's Medications  New Prescriptions   INSULIN LISPRO (HUMALOG KWIKPEN) 100 UNIT/ML KWIKPEN    Inject 5-10 Units into the skin 3 (three) times daily. 15 min before meals  Previous Medications   BLOOD GLUCOSE MONITORING SUPPL DEVI    1 each by Does not apply route in the morning, at noon, and at bedtime. May substitute to any manufacturer covered by patient's insurance.   BUSPIRONE (BUSPAR) 10 MG TABLET    TAKE 2 TABLETS BY MOUTH 2 TIMES DAILY.    CONTINUOUS GLUCOSE RECEIVER (FREESTYLE LIBRE 3 READER) DEVI    1 (ONE) EACH AS DIRECTED   CONTINUOUS GLUCOSE SENSOR (FREESTYLE LIBRE 3 SENSOR) MISC    1 each by Other route every 14 (fourteen) days.   DIGOXIN (LANOXIN) 0.125 MG TABLET    TAKE 1 TABLET BY MOUTH EVERY DAY   ESOMEPRAZOLE (NEXIUM) 40 MG CAPSULE    Take 40 mg by mouth daily as needed (acid reflux).   EVOLOCUMAB (REPATHA SURECLICK) 140 MG/ML SOAJ    Inject 140 mg into the skin every 14 (fourteen) days.   EZETIMIBE (ZETIA) 10 MG TABLET    TAKE 1 TABLET BY MOUTH EVERY DAY   GLUCAGON (GVOKE HYPOPEN 1-PACK) 1 MG/0.2ML SOAJ    Inject 1 Pen into the skin as directed.   JARDIANCE 25 MG TABS TABLET    Take 1 tablet (25 mg total) by mouth daily.  LANTUS SOLOSTAR 100 UNIT/ML SOLOSTAR PEN    Inject 30 Units into the skin daily.   LOPERAMIDE (IMODIUM) 2 MG CAPSULE    Take 1 capsule (2 mg total) by mouth as needed for diarrhea or loose stools.   MIDODRINE (PROAMATINE) 2.5 MG TABLET    TAKE 1 TABLET (2.5 MG TOTAL) BY MOUTH 3 (THREE) TIMES DAILY WITH MEALS.   MUPIROCIN OINTMENT (BACTROBAN) 2 %    Apply 1 Application topically daily as needed (wound care).   NALOXONE (NARCAN) NASAL SPRAY 4 MG/0.1 ML       OXYCODONE (ROXICODONE) 15 MG IMMEDIATE RELEASE TABLET    Take 15 mg by mouth every 4 (four) hours as needed for pain.    PREGABALIN (LYRICA) 75 MG CAPSULE    Take 75 mg by mouth 3 (three) times daily.   RYBELSUS 14 MG TABS    TAKE 1 TABLET (14 MG TOTAL) BY MOUTH DAILY   TRINTELLIX 20 MG TABS TABLET    Take 1 tablet (20 mg total) by mouth daily.   VITAMIN D, ERGOCALCIFEROL, (DRISDOL) 50000 UNITS CAPS CAPSULE    Take 50,000 Units by mouth every Tuesday.  Modified Medications   No medications on file  Discontinued Medications   No medications on file    Allergies Allergies  Allergen Reactions   Lipitor [Atorvastatin] Other (See Comments)    Muscle pain   Oxymorphone Other (See Comments)    Urinary retention   Gabapentin Other (See  Comments)    Unknown reaction   Lisinopril Cough    cough   Morphine Nausea And Vomiting   Penicillin G Rash    Past Medical History Past Medical History:  Diagnosis Date   AICD (automatic cardioverter/defibrillator) present    Anxiety    Arthritis    Cardiac defibrillator in situ    Cardiomyopathy (HCC)    CHF (congestive heart failure) (HCC)    Chronic lower back pain    Chronic pain syndrome    Chronic systolic (congestive) heart failure (HCC)    CKD (chronic kidney disease)    Depression    Diabetes (HCC)    Diabetic neuropathy (HCC)    GERD (gastroesophageal reflux disease)    History of kidney cancer    Hypotension    Myocardial infarction [facility])     Past Surgical History Past Surgical History:  Procedure Laterality Date   BACK SURGERY     CARDIAC DEFIBRILLATOR PLACEMENT     In Situ   KIDNEY CYST REMOVAL Right    WISDOM TOOTH EXTRACTION      Family History family history includes Cancer in his paternal grandmother; Diabetes in his brother, brother, maternal grandfather, maternal grandmother, mother, and sister; Heart failure in his maternal grandfather, maternal grandmother, and mother.  Social History Social History   Socioeconomic History   Marital status: Widowed    Spouse name: Not on file   Number of children: 1   Years of education: Not on file   Highest education level: 8th grade  Occupational History   Occupation: Disabled    Comment: 2018  Tobacco Use   Smoking status: Former    Types: Cigarettes    Passive exposure: Never   Smokeless tobacco: Never  Vaping Use   Vaping status: Never Used  Substance and Sexual Activity   Alcohol use: Never   Drug use: Never   Sexual activity: Yes    Partners: Female    Birth control/protection: None  Other Topics Concern   Not on  file  Social History Narrative   Not on file   Social Drivers of Health   Financial Resource Strain: Medium Risk (01/30/2023)   Overall Financial Resource Strain  (CARDIA)    Difficulty of Paying Living Expenses: Somewhat hard  Food Insecurity: No Food Insecurity (07/24/2023)   Hunger Vital Sign    Worried About Running Out of Food in the Last Year: Never true    Ran Out of Food in the Last Year: Never true  Transportation Needs: No Transportation Needs (07/24/2023)   PRAPARE - Administrator, Civil Service (Medical): No    Lack of Transportation (Non-Medical): No  Physical Activity: Insufficiently Active (01/30/2023)   Exercise Vital Sign    Days of Exercise per Week: 2 days    Minutes of Exercise per Session: 20 min  Stress: No Stress Concern Present (01/30/2023)   [employer] of Occupational Health - Occupational Stress Questionnaire    Feeling of Stress : Only a little  Social Connections: Moderately Integrated (01/30/2023)   Social Connection and Isolation Panel [NHANES]    Frequency of Communication with Friends and Family: More than three times a week    Frequency of Social Gatherings with Friends and Family: Three times a week    Attends Religious Services: More than 4 times per year    Active Member of Clubs or Organizations: Yes    Attends Banker Meetings: More than 4 times per year    Marital Status: Widowed  Intimate Partner Violence: Not At Risk (07/24/2023)   Humiliation, Afraid, Rape, and Kick questionnaire    Fear of Current or Ex-Partner: No    Emotionally Abused: No    Physically Abused: No    Sexually Abused: No    Lab Results  Component Value Date   HGBA1C 8.8 (H) 06/22/2023   HGBA1C 8.6 (H) 04/02/2023   HGBA1C 9.8 (H) 02/03/2023   Lab Results  Component Value Date   CHOL 158 06/22/2023   Lab Results  Component Value Date   HDL 30 (L) 06/22/2023   Lab Results  Component Value Date   LDLCALC 70 06/22/2023   Lab Results  Component Value Date   TRIG 367 (H) 06/22/2023   Lab Results  Component Value Date   CHOLHDL 5.3 (H) 06/22/2023   Lab Results  Component Value Date    CREATININE 2.40 (H) 07/25/2023   Lab Results  Component Value Date   GFR 37.24 (L) 06/30/2023   No results found for: "MICROALBUR", "MALB24HUR"    Component Value Date/Time   NA 138 07/25/2023 0548   NA 140 06/22/2023 1454   K 4.4 07/25/2023 0548   CL 104 07/25/2023 0548   CO2 24 07/25/2023 0548   GLUCOSE 183 (H) 07/25/2023 0548   BUN 35 (H) 07/25/2023 0548   BUN 26 (H) 06/22/2023 1454   CREATININE 2.40 (H) 07/25/2023 0548   CALCIUM 8.9 07/25/2023 0548   PROT 8.5 (H) 07/24/2023 0009   PROT 7.4 06/22/2023 1454   ALBUMIN 4.4 07/24/2023 0009   ALBUMIN 4.4 06/22/2023 1454   AST 33 07/24/2023 0009   ALT 31 07/24/2023 0009   ALKPHOS 45 07/24/2023 0009   BILITOT 1.2 07/24/2023 0009   BILITOT 0.4 06/22/2023 1454   GFRNONAA 31 (L) 07/25/2023 0548   GFRAA 41 (L) 01/11/2018 1740      Latest Ref Rng & Units 07/25/2023    5:48 AM 07/24/2023    5:37 AM 07/24/2023    1:43 AM  BMP  Glucose 70 - 99 mg/dL 161  096    BUN 6 - 20 mg/dL 35  32    Creatinine 0.45 - 1.24 mg/dL 4.09  8.11    Sodium 914 - 145 mmol/L 138  137  139   Potassium 3.5 - 5.1 mmol/L 4.4  4.9  4.7   Chloride 98 - 111 mmol/L 104  103    CO2 22 - 32 mmol/L 24  20    Calcium 8.9 - 10.3 mg/dL 8.9  9.6         Component Value Date/Time   WBC 8.3 07/24/2023 0537   RBC 4.49 07/24/2023 0537   HGB 12.8 (L) 07/24/2023 0537   HGB 11.9 (L) 06/22/2023 1454   HCT 37.4 (L) 07/24/2023 0537   HCT 36.4 (L) 06/22/2023 1454   PLT 168 07/24/2023 0537   PLT 228 06/22/2023 1454   MCV 83.3 07/24/2023 0537   MCV 85 06/22/2023 1454   MCH 28.5 07/24/2023 0537   MCHC 34.2 07/24/2023 0537   RDW 13.2 07/24/2023 0537   RDW 13.5 06/22/2023 1454   LYMPHSABS 0.9 07/24/2023 0009   LYMPHSABS 1.6 06/22/2023 1454   MONOABS 0.7 07/24/2023 0009   EOSABS 0.0 07/24/2023 0009   EOSABS 0.1 06/22/2023 1454   BASOSABS 0.0 07/24/2023 0009   BASOSABS 0.0 06/22/2023 1454     Parts of this note may have been dictated using voice recognition  software. There may be variances in spelling and vocabulary which are unintentional. Not all errors are proofread. Please notify the Thereasa Parkin if any discrepancies are noted or if the meaning of any statement is not clear.

## 2023-07-30 NOTE — Patient Instructions (Addendum)
 Humalog 5 units three times a day 15 min before meals, go up by 1 unit every day until blood sugar 2 hours after meals is close to 150    ______________   Goals of DM therapy:  Morning Fasting blood sugar: 80-140  Blood sugar before meals: 80-140 Bed time blood sugar: 100-150  A1C <7%, limited only by hypoglycemia  1.Diabetes medications and their side effects discussed, including hypoglycemia    2. Check blood glucose:  a) Always check blood sugars before driving. Please see below (under hypoglycemia) on how to manage b) Check a minimum of 3 times/day or more as needed when having symptoms of hypoglycemia.   c) Try to check blood glucose before sleeping/in the middle of the night to ensure that it is remaining stable and not dropping less than 100 d) Check blood glucose more often if sick  3. Diet: a) 3 meals per day schedule b: Restrict carbs to 60-70 grams (4 servings) per meal c) Colorful vegetables - 3 servings a day, and low sugar fruit 2 servings/day Plate control method: 1/4 plate protein, 1/4 starch, 1/2 green, yellow, or red vegetables d) Avoid carbohydrate snacks unless hypoglycemic episode, or increased physical activity  4. Regular exercise as tolerated, preferably 3 or more hours a week  5. Hypoglycemia: a)  Do not drive or operate machinery without first testing blood glucose to assure it is over 90 mg%, or if dizzy, lightheaded, not feeling normal, etc, or  if foot or leg is numb or weak. b)  If blood glucose less than 70, take four 5gm Glucose tabs or 15-30 gm Glucose gel.  Repeat every 15 min as needed until blood sugar is >100 mg/dl. If hypoglycemia persists then call 911.   6. Sick day management: a) Check blood glucose more often b) Continue usual therapy if blood sugars are elevated.   7. Contact the doctor immediately if blood glucose is frequently <60 mg/dl, or an episode of severe hypoglycemia occurs (where someone had to give you glucose/  glucagon or  if you passed out from a low blood glucose), or if blood glucose is persistently >350 mg/dl, for further management  8. A change in level of physical activity or exercise and a change in diet may also affect your blood sugar. Check blood sugars more often and call if needed.  Instructions: 1. Bring glucose meter, blood glucose records on every visit for review 2. Continue to follow up with primary care physician and other providers for medical care 3. Yearly eye  and foot exam 4. Please get blood work done prior to the next appointment

## 2023-08-01 ENCOUNTER — Encounter: Payer: Self-pay | Admitting: Physician Assistant

## 2023-08-02 ENCOUNTER — Other Ambulatory Visit: Payer: Self-pay | Admitting: Family Medicine

## 2023-08-03 ENCOUNTER — Ambulatory Visit: Admitting: Physician Assistant

## 2023-08-03 ENCOUNTER — Ambulatory Visit: Payer: Medicaid Other | Admitting: Physician Assistant

## 2023-08-03 ENCOUNTER — Encounter: Payer: Self-pay | Admitting: Physician Assistant

## 2023-08-03 ENCOUNTER — Telehealth: Payer: Self-pay

## 2023-08-03 VITALS — BP 104/72 | HR 72 | Temp 97.6°F | Ht 68.0 in | Wt 179.8 lb

## 2023-08-03 DIAGNOSIS — E1165 Type 2 diabetes mellitus with hyperglycemia: Secondary | ICD-10-CM

## 2023-08-03 DIAGNOSIS — I429 Cardiomyopathy, unspecified: Secondary | ICD-10-CM

## 2023-08-03 DIAGNOSIS — E873 Alkalosis: Secondary | ICD-10-CM | POA: Diagnosis not present

## 2023-08-03 DIAGNOSIS — E860 Dehydration: Secondary | ICD-10-CM | POA: Diagnosis not present

## 2023-08-03 DIAGNOSIS — E875 Hyperkalemia: Secondary | ICD-10-CM | POA: Diagnosis not present

## 2023-08-03 DIAGNOSIS — Z794 Long term (current) use of insulin: Secondary | ICD-10-CM

## 2023-08-03 MED ORDER — FREESTYLE LIBRE 3 SENSOR MISC: 1.0000 | 1 refills | Status: DC

## 2023-08-03 NOTE — Assessment & Plan Note (Signed)
 Denies any continued symptoms Continue to monitor Will adjust treatment depending on symptoms

## 2023-08-03 NOTE — Assessment & Plan Note (Signed)
 Symptoms resolved post-discharge. Hydration status adequate. - Monitor hydration and ensure adequate fluid intake. - Use loperamide as needed for diarrhea.

## 2023-08-03 NOTE — Progress Notes (Signed)
 Subjective:  Patient ID: Greg Esparza, male    DOB: 08/13/1965  Age: 58 y.o. MRN: [id number]  Chief Complaint  Patient presents with   Hospital Follow up    Discussed the use of AI scribe software for clinical note transcription with the patient, who gave verbal consent to proceed.   Follow up Hospitalization  Patient was admitted to [facility] on 07/24/23 and discharged on 07/25/23. He was treated for Dehydration, Hyperkalemia, Metabolic Alkalosis, and Respiratory Alkalosis due to Norovirus Gastroenteritis.   Treatment for this included IV fluids, Imodium. He reports good compliance with treatment. He reports this condition is improved.   Discussed the use of AI scribe software for clinical note transcription with the patient, who gave verbal consent to proceed.  History of Present Illness   Greg Esparza, a patient with a history of heart disease and diabetes, presents for a follow-up after a recent hospitalization due to a norovirus infection. He reports that he was admitted to the hospital with severe vomiting and diarrhea, which has since resolved. He was treated with Phenergan and intravenous fluids during his hospital stay. He also mentions that his blood levels of digoxin and Lantus were found to be too high during his hospital stay, leading to a change in his medication regimen.  In addition to his recent illness, Greg Esparza discusses his ongoing management of diabetes. He is currently on Humalog and Lantus, and he reports that his blood sugar levels have been around 150 since starting Humalog. However, he has been having issues with his Freestyle Libre sensor, which he uses to monitor his blood sugar levels. He reports that he accidentally dislodged a sensor and has been unable to get a replacement.  Greg Esparza also expresses dissatisfaction with his current cardiologist, stating that he feels the doctor is not attentive to his needs and is dismissive of his disability status. He  requests a referral to a new cardiologist.                08/03/2023    1:51 PM 05/04/2023   10:24 AM 02/03/2023    3:16 PM  Depression screen PHQ 2/9  Decreased Interest 3 1 1   Down, Depressed, Hopeless 0 0 2  PHQ - 2 Score 3 1 3   Altered sleeping 2 0 0  Tired, decreased energy 3 2 3   Change in appetite 2 0 3  Feeling bad or failure about yourself  1 0 0  Trouble concentrating 0 2 1  Moving slowly or fidgety/restless 0 1 0  Suicidal thoughts 0 0 0  PHQ-9 Score 11 6 10   Difficult doing work/chores Not difficult at all Somewhat difficult Extremely dIfficult        05/04/2023   10:24 AM  Fall Risk   Falls in the past year? 0  Number falls in past yr: 0  Injury with Fall? 0  Risk for fall due to : No Fall Risks  Follow up Falls evaluation completed    Patient Care Team: [name], [place] as PCP - General (Physician Assistant) Iven Finn., MD as Referring Physician (Neurology) Sandy Salaam, MD as Referring Physician (Cardiology) Perley Jain as Physician Assistant (Pain Medicine) Annamary Rummage Jenelle Mages, DPM as Consulting Physician (Podiatry) Liliane Shi Dorian Furnace, MD as Consulting Physician (Urology) Bonnee Quin, [place] (Cardiology)   Review of Systems  Constitutional:  Positive for fatigue. Negative for appetite change and fever.  HENT:  Negative for congestion, ear pain, sinus pressure and sore throat.  Respiratory:  Negative for cough, chest tightness, shortness of breath and wheezing.   Cardiovascular:  Negative for chest pain and palpitations.  Gastrointestinal:  Negative for abdominal pain, constipation, diarrhea, nausea and vomiting.  Genitourinary:  Negative for dysuria and hematuria.  Musculoskeletal:  Negative for arthralgias, back pain, joint swelling and myalgias.  Skin:  Negative for rash.  Neurological:  Negative for dizziness, weakness and headaches.  Psychiatric/Behavioral:  Negative for dysphoric mood. The patient is not  nervous/anxious.     Current Outpatient Medications on File Prior to Visit  Medication Sig Dispense Refill   Blood Glucose Monitoring Suppl DEVI 1 each by Does not apply route in the morning, at noon, and at bedtime. May substitute to any manufacturer covered by patient's insurance. 1 each 0   busPIRone (BUSPAR) 10 MG tablet TAKE 2 TABLETS BY MOUTH 2 TIMES DAILY. 360 tablet 2   Continuous Glucose Receiver (FREESTYLE LIBRE 3 READER) DEVI 1 (ONE) EACH AS DIRECTED     esomeprazole (NEXIUM) 40 MG capsule Take 40 mg by mouth daily as needed (acid reflux).  0   Evolocumab (REPATHA SURECLICK) 140 MG/ML SOAJ Inject 140 mg into the skin every 14 (fourteen) days. 2 mL 2   ezetimibe (ZETIA) 10 MG tablet TAKE 1 TABLET BY MOUTH EVERY DAY 90 tablet 1   Glucagon (GVOKE HYPOPEN 1-PACK) 1 MG/0.2ML SOAJ Inject 1 Pen into the skin as directed. 0.2 mL 3   insulin lispro (HUMALOG KWIKPEN) 100 UNIT/ML KwikPen Inject 5-10 Units into the skin 3 (three) times daily. 15 min before meals 15 mL 3   JARDIANCE 25 MG TABS tablet Take 1 tablet (25 mg total) by mouth daily. (Patient taking differently: Take 12.5 mg by mouth daily.) 30 tablet 3   LANTUS SOLOSTAR 100 UNIT/ML Solostar Pen Inject 30 Units into the skin daily. 15 mL 6   loperamide (IMODIUM) 2 MG capsule Take 1 capsule (2 mg total) by mouth as needed for diarrhea or loose stools. 30 capsule 0   midodrine (PROAMATINE) 2.5 MG tablet TAKE 1 TABLET (2.5 MG TOTAL) BY MOUTH 3 (THREE) TIMES DAILY WITH MEALS. (Patient taking differently: Take 2.5 mg by mouth daily.) 270 tablet 2   mupirocin ointment (BACTROBAN) 2 % Apply 1 Application topically daily as needed (wound care). 30 g 1   naloxone (NARCAN) nasal spray 4 mg/0.1 mL      oxyCODONE (ROXICODONE) 15 MG immediate release tablet Take 15 mg by mouth every 4 (four) hours as needed for pain.   0   pregabalin (LYRICA) 75 MG capsule Take 75 mg by mouth 3 (three) times daily.     RYBELSUS 14 MG TABS TAKE 1 TABLET (14 MG TOTAL)  BY MOUTH DAILY (Patient taking differently: Take 7 tablets by mouth daily.) 30 tablet 1   TRINTELLIX 20 MG TABS tablet TAKE 1 TABLET BY MOUTH EVERY DAY 30 tablet 2   Vitamin D, Ergocalciferol, (DRISDOL) 50000 units CAPS capsule Take 50,000 Units by mouth every Tuesday.  0   digoxin (LANOXIN) 0.125 MG tablet TAKE 1 TABLET BY MOUTH EVERY DAY (Patient not taking: Reported on 08/03/2023) 90 tablet 1   Current Facility-Administered Medications on File Prior to Visit  Medication Dose Route Frequency Provider Last Rate Last Admin   magnesium citrate solution 1 Bottle  1 Bottle Oral Once Berneice Heinrich Delbert Phenix., MD       Past Medical History:  Diagnosis Date   AICD (automatic cardioverter/defibrillator) present    Anxiety    Arthritis  Cardiac defibrillator in situ    Cardiomyopathy (HCC)    CHF (congestive heart failure) (HCC)    Chronic lower back pain    Chronic pain syndrome    Chronic systolic (congestive) heart failure (HCC)    CKD (chronic kidney disease)    Depression    Diabetes (HCC)    Diabetic neuropathy (HCC)    GERD (gastroesophageal reflux disease)    History of kidney cancer    Hypotension    Myocardial infarction [facility])    Past Surgical History:  Procedure Laterality Date   BACK SURGERY     CARDIAC DEFIBRILLATOR PLACEMENT     In Situ   KIDNEY CYST REMOVAL Right    WISDOM TOOTH EXTRACTION      Family History  Problem Relation Age of Onset   Heart failure Mother    Diabetes Mother    Diabetes Sister    Diabetes Brother    Diabetes Brother    Heart failure Maternal Grandmother    Diabetes Maternal Grandmother    Heart failure Maternal Grandfather    Diabetes Maternal Grandfather    Cancer Paternal Grandmother        unknown   Social History   Socioeconomic History   Marital status: Widowed    Spouse name: Not on file   Number of children: 1   Years of education: Not on file   Highest education level: 8th grade  Occupational History   Occupation: Disabled     Comment: 2018  Tobacco Use   Smoking status: Former    Types: Cigarettes    Passive exposure: Never   Smokeless tobacco: Never  Vaping Use   Vaping status: Never Used  Substance and Sexual Activity   Alcohol use: Never   Drug use: Never   Sexual activity: Yes    Partners: Female    Birth control/protection: None  Other Topics Concern   Not on file  Social History Narrative   Not on file   Social Drivers of Health   Financial Resource Strain: Medium Risk (01/30/2023)   Overall Financial Resource Strain (CARDIA)    Difficulty of Paying Living Expenses: Somewhat hard  Food Insecurity: No Food Insecurity (07/24/2023)   Hunger Vital Sign    Worried About Running Out of Food in the Last Year: Never true    Ran Out of Food in the Last Year: Never true  Transportation Needs: No Transportation Needs (07/24/2023)   PRAPARE - Administrator, Civil Service (Medical): No    Lack of Transportation (Non-Medical): No  Physical Activity: Insufficiently Active (01/30/2023)   Exercise Vital Sign    Days of Exercise per Week: 2 days    Minutes of Exercise per Session: 20 min  Stress: No Stress Concern Present (01/30/2023)   [employer] of Occupational Health - Occupational Stress Questionnaire    Feeling of Stress : Only a little  Social Connections: Moderately Integrated (01/30/2023)   Social Connection and Isolation Panel [NHANES]    Frequency of Communication with Friends and Family: More than three times a week    Frequency of Social Gatherings with Friends and Family: Three times a week    Attends Religious Services: More than 4 times per year    Active Member of Clubs or Organizations: Yes    Attends Banker Meetings: More than 4 times per year    Marital Status: Widowed    Objective:  BP 104/72   Pulse 72   Temp 97.6 F (  36.4 C) (Temporal)   Ht 5\' 8"  (1.727 m)   Wt 179 lb 12.8 oz (81.6 kg)   SpO2 99%   BMI 27.34 kg/m      08/03/2023    2:37  PM 08/03/2023    1:56 PM 07/30/2023   10:43 AM  BP/Weight  Systolic BP 104 98 110  Diastolic BP 72 58 80  Wt. (Lbs)  179.8 176  BMI  27.34 kg/m2 26.76 kg/m2    Physical Exam Vitals reviewed.  Constitutional:      Appearance: Normal appearance.  Cardiovascular:     Rate and Rhythm: Normal rate and regular rhythm.     Heart sounds: Normal heart sounds.  Pulmonary:     Effort: Pulmonary effort is normal.     Breath sounds: Normal breath sounds.  Abdominal:     General: Bowel sounds are normal.     Palpations: Abdomen is soft.     Tenderness: There is no abdominal tenderness.  Neurological:     Mental Status: He is alert and oriented to person, place, and time.  Psychiatric:        Mood and Affect: Mood normal.        Behavior: Behavior normal.     Diabetic Foot Exam - Simple   No data filed      Lab Results  Component Value Date   WBC 8.3 07/24/2023   HGB 12.8 (L) 07/24/2023   HCT 37.4 (L) 07/24/2023   PLT 168 07/24/2023   GLUCOSE 183 (H) 07/25/2023   CHOL 158 06/22/2023   TRIG 367 (H) 06/22/2023   HDL 30 (L) 06/22/2023   LDLCALC 70 06/22/2023   ALT 31 07/24/2023   AST 33 07/24/2023   NA 138 07/25/2023   K 4.4 07/25/2023   CL 104 07/25/2023   CREATININE 2.40 (H) 07/25/2023   BUN 35 (H) 07/25/2023   CO2 24 07/25/2023   TSH 1.190 02/03/2023   HGBA1C 8.8 (H) 06/22/2023      Assessment & Plan:   Cardiomyopathy, unspecified type [facility]) Assessment & Plan: History of myocardial infarction, on digoxin since 2008-2009. Recent hospitalization showed elevated digoxin levels. Evaluation needed for digoxin necessity and potential tapering to prevent arrhythmias. - Check digoxin levels with current lab work. - Contact current cardiologist for digoxin management guidance. - Consider referral to [facility] cardiology for second opinion. - Encourage regular walking as tolerated for cardiac rehabilitation. - Monitor blood pressure and hydration.  Orders: -     Digoxin  level  Metabolic alkalosis Assessment & Plan: Denies any continued symptoms Continue to monitor Will adjust treatment depending on symptoms   Hyperkalemia Assessment & Plan: Symptoms resolved post-discharge. Hydration status adequate. - Monitor hydration and ensure adequate fluid intake. - Use loperamide as needed for diarrhea.   Orders: -     Comprehensive metabolic panel with GFR  Dehydration Assessment & Plan: Continue to monitor fluid intake Labs drawn today Will adjust treatment depending on results  Orders: -     CBC with Differential/Platelet -     Comprehensive metabolic panel with GFR  Respiratory alkalosis Assessment & Plan: Symptoms resolved post-discharge. Hydration status adequate. - Monitor hydration and ensure adequate fluid intake. - Use loperamide as needed for diarrhea.   Type 2 diabetes mellitus with hyperglycemia, with long-term current use of insulin (HCC) -     FreeStyle Libre 3 Sensor; 1 each by Other route every 14 (fourteen) days. With 3 sensors provided per month  Dispense: 6 each; Refill: 1  Meds ordered this encounter  Medications   Continuous Glucose Sensor (FREESTYLE LIBRE 3 SENSOR) MISC    Sig: 1 each by Other route every 14 (fourteen) days. With 3 sensors provided per month    Dispense:  6 each    Refill:  1    Orders Placed This Encounter  Procedures   CBC with Differential/Platelet   Comprehensive metabolic panel with GFR   Digoxin level    General Health Maintenance Advised to maintain hydration and engage in regular physical activity to manage diabetes and overall health. Emphasis on a Mediterranean diet to control blood sugar levels. - Encourage regular physical activity, such as walking, to improve cardiovascular health and manage blood sugar levels. - Ensure adequate hydration. - Adopt a Mediterranean diet focusing on low sugars and carbohydrates.           Follow-up: No follow-ups on file.   I,Lauren M  Auman,acting as a Neurosurgeon for [employer], PA.,have documented all relevant documentation on the behalf of [name], PA,as directed by  [name], PA while in the presence of [name], [place].   An After Visit Summary was printed and given to the patient.  [name], [place] [facility] [phone]

## 2023-08-03 NOTE — Telephone Encounter (Signed)
 Patient was here for visit today to follow up from hospital, and the concern was if the patient needed to continue taking his Digoxin or discontinue because the patient was told while he was in the hospital that his levels were off and that he needed to discontinue the medication although it was not indicated in his Discharge summary to discontinue taking the medication. The patient called and left message with the cardiologist office asking a nurse if he still needed to continue the medication or discontinue. The patient has not bee taking the Digoxin since leaving the hospital on 07/25/23. Called Cardiology office to ask if there had been a response from the provider if the patient needed to continue the medication or discontinue and the answering service stated that they would have to have a nurse call me back to let me know. So awaiting phone call back from [facility] Cardiology.

## 2023-08-03 NOTE — Assessment & Plan Note (Addendum)
 Continue to monitor fluid intake Labs drawn today Will adjust treatment depending on results

## 2023-08-03 NOTE — Assessment & Plan Note (Addendum)
 Symptoms resolved post-discharge. Hydration status adequate. - Monitor hydration and ensure adequate fluid intake. - Use loperamide as needed for diarrhea.

## 2023-08-03 NOTE — Assessment & Plan Note (Signed)
 History of myocardial infarction, on digoxin since 2008-2009. Recent hospitalization showed elevated digoxin levels. Evaluation needed for digoxin necessity and potential tapering to prevent arrhythmias. - Check digoxin levels with current lab work. - Contact current cardiologist for digoxin management guidance. - Consider referral to [facility] cardiology for second opinion. - Encourage regular walking as tolerated for cardiac rehabilitation. - Monitor blood pressure and hydration.

## 2023-08-04 LAB — CBC WITH DIFFERENTIAL/PLATELET
Basophils Absolute: 0.1 10*3/uL (ref 0.0–0.2)
Basos: 1 %
EOS (ABSOLUTE): 0.1 10*3/uL (ref 0.0–0.4)
Eos: 1 %
Hematocrit: 37.4 % — ABNORMAL LOW (ref 37.5–51.0)
Hemoglobin: 12.5 g/dL — ABNORMAL LOW (ref 13.0–17.7)
Immature Grans (Abs): 0 10*3/uL (ref 0.0–0.1)
Immature Granulocytes: 1 %
Lymphocytes Absolute: 2.2 10*3/uL (ref 0.7–3.1)
Lymphs: 34 %
MCH: 28.5 pg (ref 26.6–33.0)
MCHC: 33.4 g/dL (ref 31.5–35.7)
MCV: 85 fL (ref 79–97)
Monocytes Absolute: 0.6 10*3/uL (ref 0.1–0.9)
Monocytes: 10 %
Neutrophils Absolute: 3.4 10*3/uL (ref 1.4–7.0)
Neutrophils: 53 %
Platelets: 249 10*3/uL (ref 150–450)
RBC: 4.38 x10E6/uL (ref 4.14–5.80)
RDW: 13.3 % (ref 11.6–15.4)
WBC: 6.4 10*3/uL (ref 3.4–10.8)

## 2023-08-04 LAB — COMPREHENSIVE METABOLIC PANEL WITH GFR
ALT: 25 IU/L (ref 0–44)
AST: 27 IU/L (ref 0–40)
Albumin: 4.1 g/dL (ref 3.8–4.9)
Alkaline Phosphatase: 55 IU/L (ref 44–121)
BUN/Creatinine Ratio: 11 (ref 9–20)
BUN: 23 mg/dL (ref 6–24)
Bilirubin Total: <0.2 mg/dL (ref 0.0–1.2)
CO2: 21 mmol/L (ref 20–29)
Calcium: 9.4 mg/dL (ref 8.7–10.2)
Chloride: 102 mmol/L (ref 96–106)
Creatinine, Ser: 2.06 mg/dL — ABNORMAL HIGH (ref 0.76–1.27)
Globulin, Total: 3.1 g/dL (ref 1.5–4.5)
Glucose: 197 mg/dL — ABNORMAL HIGH (ref 70–99)
Potassium: 5.4 mmol/L — ABNORMAL HIGH (ref 3.5–5.2)
Sodium: 138 mmol/L (ref 134–144)
Total Protein: 7.2 g/dL (ref 6.0–8.5)
eGFR: 37 mL/min/{1.73_m2} — ABNORMAL LOW (ref >59)

## 2023-08-04 NOTE — Telephone Encounter (Signed)
 Spoke with Cardiology office, and per Last visit on 06/29/23 patient is no longer supposed to be on the Digoxin due to not knowing the real original reason for being on the medication and no indication. Called the patient and informed him that he does not need to be on the medication anymore. Patient informed.

## 2023-08-10 ENCOUNTER — Other Ambulatory Visit: Payer: Self-pay

## 2023-08-10 ENCOUNTER — Encounter: Payer: Self-pay | Admitting: Physician Assistant

## 2023-08-10 DIAGNOSIS — E875 Hyperkalemia: Secondary | ICD-10-CM

## 2023-08-13 ENCOUNTER — Telehealth: Payer: Self-pay | Admitting: Physician Assistant

## 2023-08-13 ENCOUNTER — Other Ambulatory Visit: Payer: Self-pay

## 2023-08-13 ENCOUNTER — Other Ambulatory Visit

## 2023-08-13 DIAGNOSIS — E875 Hyperkalemia: Secondary | ICD-10-CM

## 2023-08-13 DIAGNOSIS — E1169 Type 2 diabetes mellitus with other specified complication: Secondary | ICD-10-CM

## 2023-08-13 MED ORDER — REPATHA SURECLICK 140 MG/ML ~~LOC~~ SOAJ: 140.0000 mg | SUBCUTANEOUS | 2 refills | Status: DC

## 2023-08-13 NOTE — Telephone Encounter (Signed)
 Prescription Request  08/13/2023  LOV: 08/03/2023  What is the name of the medication or equipment? Evolocumab  (REPATHA  SURECLICK) 140 MG/ML SOAJ   Have you contacted your pharmacy to request a refill? No   Which pharmacy would you like this sent to?  [id number] - [place], [place] - [address] [place] [place] [address] Phone: [phone] Fax: [phone]    Patient notified that their request is being sent to the clinical staff for review and that they should receive a response within 2 business days.   Please advise at [facility] [phone]

## 2023-08-14 LAB — COMPREHENSIVE METABOLIC PANEL WITH GFR
ALT: 23 IU/L (ref 0–44)
AST: 25 IU/L (ref 0–40)
Albumin: 4.2 g/dL (ref 3.8–4.9)
Alkaline Phosphatase: 59 IU/L (ref 44–121)
BUN/Creatinine Ratio: 14 (ref 9–20)
BUN: 34 mg/dL — ABNORMAL HIGH (ref 6–24)
Bilirubin Total: 0.2 mg/dL (ref 0.0–1.2)
CO2: 22 mmol/L (ref 20–29)
Calcium: 9.2 mg/dL (ref 8.7–10.2)
Chloride: 102 mmol/L (ref 96–106)
Creatinine, Ser: 2.36 mg/dL — ABNORMAL HIGH (ref 0.76–1.27)
Globulin, Total: 2.9 g/dL (ref 1.5–4.5)
Glucose: 149 mg/dL — ABNORMAL HIGH (ref 70–99)
Potassium: 5 mmol/L (ref 3.5–5.2)
Sodium: 139 mmol/L (ref 134–144)
Total Protein: 7.1 g/dL (ref 6.0–8.5)
eGFR: 32 mL/min/{1.73_m2} — ABNORMAL LOW (ref >59)

## 2023-08-17 ENCOUNTER — Other Ambulatory Visit: Payer: Self-pay | Admitting: Physician Assistant

## 2023-08-17 ENCOUNTER — Encounter: Payer: Self-pay | Admitting: Physician Assistant

## 2023-08-17 ENCOUNTER — Other Ambulatory Visit: Payer: Self-pay

## 2023-08-17 DIAGNOSIS — N1832 Chronic kidney disease, stage 3b: Secondary | ICD-10-CM

## 2023-08-17 MED ORDER — DAPAGLIFLOZIN PROPANEDIOL 5 MG PO TABS: 5.0000 mg | ORAL_TABLET | Freq: Every day | ORAL | 2 refills | Status: DC

## 2023-08-18 ENCOUNTER — Telehealth: Payer: Self-pay

## 2023-08-18 NOTE — Telephone Encounter (Signed)
 [facility] Far[facility]iga  was approved  Copied from CRM [id number]. Topic: Clinical - Prescription Issue >> Aug 18, 2023  9:13 AM Zipporah Him wrote: Reason for CRM: [facility] calling back to reach Jayleen Afonso. Pharmacy states that she is trying to run the name brand Farxiga  for the patient but the system is giving pushback that the patient must try metformin first. Please call ASAP to get this resolved. Number [phone]

## 2023-08-18 NOTE — Telephone Encounter (Signed)
 Farxiga  has to be Brand name

## 2023-08-30 ENCOUNTER — Ambulatory Visit: Admitting: "Endocrinology

## 2023-08-30 ENCOUNTER — Encounter: Payer: Self-pay | Admitting: "Endocrinology

## 2023-08-30 VITALS — BP 122/78 | HR 72 | Ht 68.0 in | Wt 184.0 lb

## 2023-08-30 DIAGNOSIS — E1165 Type 2 diabetes mellitus with hyperglycemia: Secondary | ICD-10-CM

## 2023-08-30 DIAGNOSIS — E782 Mixed hyperlipidemia: Secondary | ICD-10-CM | POA: Diagnosis not present

## 2023-08-30 DIAGNOSIS — Z794 Long term (current) use of insulin: Secondary | ICD-10-CM

## 2023-08-30 DIAGNOSIS — Z7984 Long term (current) use of oral hypoglycemic drugs: Secondary | ICD-10-CM | POA: Diagnosis not present

## 2023-08-30 NOTE — Patient Instructions (Signed)
  Will recommend the following: Lantus  26-28 units qam  Jardiance  25 mg every day half a pill (per cardiologist, due to dizziness needing midodrine ) Rybelsus  14 mg everyday half a pill (per PA due to nausea, pt will attempt 14 mg every day again to see tolerance)  Humalog  6 units with brunch, and 7 units with supper 15 min before meals   _____________    Goals of DM therapy:  Morning Fasting blood sugar: 80-140  Blood sugar before meals: 80-140 Bed time blood sugar: 100-150  A1C <7%, limited only by hypoglycemia  1.Diabetes medications and their side effects discussed, including hypoglycemia    2. Check blood glucose:  a) Always check blood sugars before driving. Please see below (under hypoglycemia) on how to manage b) Check a minimum of 3 times/day or more as needed when having symptoms of hypoglycemia.   c) Try to check blood glucose before sleeping/in the middle of the night to ensure that it is remaining stable and not dropping less than 100 d) Check blood glucose more often if sick  3. Diet: a) 3 meals per day schedule b: Restrict carbs to 60-70 grams (4 servings) per meal c) Colorful vegetables - 3 servings a day, and low sugar fruit 2 servings/day Plate control method: 1/4 plate protein, 1/4 starch, 1/2 green, yellow, or red vegetables d) Avoid carbohydrate snacks unless hypoglycemic episode, or increased physical activity  4. Regular exercise as tolerated, preferably 3 or more hours a week  5. Hypoglycemia: a)  Do not drive or operate machinery without first testing blood glucose to assure it is over 90 mg%, or if dizzy, lightheaded, not feeling normal, etc, or  if foot or leg is numb or weak. b)  If blood glucose less than 70, take four 5gm Glucose tabs or 15-30 gm Glucose gel.  Repeat every 15 min as needed until blood sugar is >100 mg/dl. If hypoglycemia persists then call 911.   6. Sick day management: a) Check blood glucose more often b) Continue usual therapy  if blood sugars are elevated.   7. Contact the doctor immediately if blood glucose is frequently <60 mg/dl, or an episode of severe hypoglycemia occurs (where someone had to give you glucose/  glucagon  or if you passed out from a low blood glucose), or if blood glucose is persistently >350 mg/dl, for further management  8. A change in level of physical activity or exercise and a change in diet may also affect your blood sugar. Check blood sugars more often and call if needed.  Instructions: 1. Bring glucose meter, blood glucose records on every visit for review 2. Continue to follow up with primary care physician and other providers for medical care 3. Yearly eye  and foot exam 4. Please get blood work done prior to the next appointment

## 2023-08-30 NOTE — Progress Notes (Signed)
 Outpatient Endocrinology Note Greg Newcomer, MD  08/30/23   Greg Esparza 01/24/1966 [id number]  Referring Provider: Odilia Bennett, PA Primary Care Provider: Odilia Bennett, PA Reason for consultation: Subjective   Assessment & Plan  Greg Esparza was seen today for follow-up.  Diagnoses and all orders for this visit:  Uncontrolled type 2 diabetes mellitus with hyperglycemia (HCC)  Long-term insulin  use (HCC)  Long term (current) use of oral hypoglycemic drugs  Mixed hypercholesterolemia and hypertriglyceridemia     Diabetes Type II complicated by neuropathy, nephropathy, defibrillator,  Lab Results  Component Value Date   GFR 37.24 (L) 06/30/2023   Hba1c goal less than 7, current Hba1c is  Lab Results  Component Value Date   HGBA1C 8.8 (H) 06/22/2023   Will recommend the following: Lantus  26-28 units qam  Jardiance  25 mg every day half a pill (per cardiologist, due to dizziness needing midodrine ) Rybelsus  14 mg everyday half a pill (per PA due to nausea, pt will attempt 14 mg every day again to see tolerance)  Humalog  6 units with brunch, and 7 units with supper 15 min before meals  No known contraindications/side effects to any of above medications Has Glucagon  - discussed use on 07/30/23    -Last LD and Tg are as follows: Lab Results  Component Value Date   LDLCALC 70 06/22/2023    Lab Results  Component Value Date   TRIG 367 (H) 06/22/2023   -On repatha  and zetia , not on statin due to aches  -Follow low fat diet and exercise   -Blood pressure goal <140/90 - Microalbumin/creatinine goal is < 30 -Last MA/Cr is as follows: No results found for: "MICROALBUR", "MALB24HUR" -not on ACE/ARB -diet changes including salt restriction -limit eating outside -counseled BP targets per standards of diabetes care -uncontrolled blood pressure can lead to retinopathy, nephropathy and cardiovascular and atherosclerotic heart disease  Reviewed and counseled on: -A1C  target -Blood sugar targets -Complications of uncontrolled diabetes  -Checking blood sugar before meals and bedtime and bring log next visit -All medications with mechanism of action and side effects -Hypoglycemia management: rule of 15's, Glucagon  Emergency Kit and medical alert ID -low-carb low-fat plate-method diet -At least 20 minutes of physical activity per day -Annual dilated retinal eye exam and foot exam -compliance and follow up needs -follow up as scheduled or earlier if problem gets worse  Call if blood sugar is less than 70 or consistently above 250    Take a 15 gm snack of carbohydrate at bedtime before you go to sleep if your blood sugar is less than 100.    If you are going to fast after midnight for a test or procedure, ask your physician for instructions on how to reduce/decrease your insulin  dose.    Call if blood sugar is less than 70 or consistently above 250  -Treating a low sugar by rule of 15  (15 gms of sugar every 15 min until sugar is more than 70) If you feel your sugar is low, test your sugar to be sure If your sugar is low (less than 70), then take 15 grams of a fast acting Carbohydrate (3-4 glucose tablets or glucose gel or 4 ounces of juice or regular soda) Recheck your sugar 15 min after treating low to make sure it is more than 70 If sugar is still less than 70, treat again with 15 grams of carbohydrate          Don't drive the hour of hypoglycemia  If unconscious/unable to eat or drink by mouth, use glucagon  injection or nasal spray baqsimi and call 911. Can repeat again in 15 min if still unconscious.  Return in about 30 days (around 09/29/2023).   I have reviewed current medications, nurse's notes, allergies, vital signs, past medical and surgical history, family medical history, and social history for this encounter. Counseled patient on symptoms, examination findings, lab findings, imaging results, treatment decisions and monitoring and prognosis.  The patient understood the recommendations and agrees with the treatment plan. All questions regarding treatment plan were fully answered.  Greg Newcomer, MD  08/30/23    History of Present Illness Greg Esparza is a 58 y.o. year old male who presents for follow up of Type II diabetes mellitus.  Greg Esparza was first diagnosed around 2005.   Diabetes education +  Home diabetes regimen: Lantus  30 units qam  Jardiance  25 mg every day half a pill  Rybelsus  14 mg everyday half a pill  Humalog  5 units three times a day 15 min before meals  COMPLICATIONS +  MI/Stroke, has a defibrillator  -  retinopathy +  neuropathy +  nephropathy  BLOOD SUGAR DATA  CGM interpretation: At today's visit, we reviewed her CGM downloads. The full report is scanned in the media. Reviewing the CGM trends, BG are elevated post lunch and dinner, and low overnight   Physical Exam  BP 122/78 (BP Location: Left Arm, Patient Position: Sitting, Cuff Size: Normal)   Pulse 72   Ht 5\' 8"  (1.727 m)   Wt 184 lb (83.5 kg)   SpO2 97%   BMI 27.98 kg/m    Constitutional: well developed, well nourished Head: normocephalic, atraumatic Eyes: sclera anicteric, no redness Neck: supple Lungs: normal respiratory effort Neurology: alert and oriented Skin: dry, no appreciable rashes Musculoskeletal: no appreciable defects Psychiatric: normal mood and affect Diabetic Foot Exam - Simple   No data filed      Current Medications Patient's Medications  New Prescriptions   No medications on file  Previous Medications   BLOOD GLUCOSE MONITORING SUPPL DEVI    1 each by Does not apply route in the morning, at noon, and at bedtime. May substitute to any manufacturer covered by patient's insurance.   BUSPIRONE  (BUSPAR ) 10 MG TABLET    TAKE 2 TABLETS BY MOUTH 2 TIMES DAILY.   CONTINUOUS GLUCOSE RECEIVER (FREESTYLE LIBRE 3 READER) DEVI    1 (ONE) EACH AS DIRECTED   CONTINUOUS GLUCOSE SENSOR (FREESTYLE LIBRE 3  SENSOR) MISC    1 each by Other route every 14 (fourteen) days. With 3 sensors provided per month   DAPAGLIFLOZIN  PROPANEDIOL (FARXIGA ) 5 MG TABS TABLET    Take 1 tablet (5 mg total) by mouth daily before breakfast.   ESOMEPRAZOLE (NEXIUM) 40 MG CAPSULE    Take 40 mg by mouth daily as needed (acid reflux).   EVOLOCUMAB  (REPATHA  SURECLICK) 140 MG/ML SOAJ    Inject 140 mg into the skin every 14 (fourteen) days.   EZETIMIBE  (ZETIA ) 10 MG TABLET    TAKE 1 TABLET BY MOUTH EVERY DAY   GLUCAGON  (GVOKE HYPOPEN  1-PACK) 1 MG/0.2ML SOAJ    Inject 1 Pen into the skin as directed.   INSULIN  LISPRO (HUMALOG  KWIKPEN) 100 UNIT/ML KWIKPEN    Inject 5-10 Units into the skin 3 (three) times daily. 15 min before meals   JARDIANCE  25 MG TABS TABLET    Take 1 tablet (25 mg total) by mouth daily.   LANTUS  Reubin Castillo  100 UNIT/ML SOLOSTAR PEN    Inject 30 Units into the skin daily.   LOPERAMIDE  (IMODIUM ) 2 MG CAPSULE    Take 1 capsule (2 mg total) by mouth as needed for diarrhea or loose stools.   MIDODRINE  (PROAMATINE ) 2.5 MG TABLET    TAKE 1 TABLET (2.5 MG TOTAL) BY MOUTH 3 (THREE) TIMES DAILY WITH MEALS.   MUPIROCIN  OINTMENT (BACTROBAN ) 2 %    Apply 1 Application topically daily as needed (wound care).   NALOXONE (NARCAN) NASAL SPRAY 4 MG/0.1 ML       OXYCODONE  (ROXICODONE ) 15 MG IMMEDIATE RELEASE TABLET    Take 15 mg by mouth every 4 (four) hours as needed for pain.    PREGABALIN  (LYRICA ) 75 MG CAPSULE    Take 75 mg by mouth 3 (three) times daily.   RYBELSUS  14 MG TABS    TAKE 1 TABLET (14 MG TOTAL) BY MOUTH DAILY   TRINTELLIX  20 MG TABS TABLET    TAKE 1 TABLET BY MOUTH EVERY DAY   VITAMIN D, ERGOCALCIFEROL, (DRISDOL) 50000 UNITS CAPS CAPSULE    Take 50,000 Units by mouth every Tuesday.  Modified Medications   No medications on file  Discontinued Medications   No medications on file    Allergies Allergies  Allergen Reactions   Lipitor [Atorvastatin] Other (See Comments)    Muscle pain   Oxymorphone Other (See  Comments)    Urinary retention   Gabapentin Other (See Comments)    Unknown reaction   Lisinopril Cough    cough   Morphine Nausea And Vomiting   Penicillin G Rash    Past Medical History Past Medical History:  Diagnosis Date   AICD (automatic cardioverter/defibrillator) present    Anxiety    Arthritis    Cardiac defibrillator in situ    Cardiomyopathy (HCC)    CHF (congestive heart failure) (HCC)    Chronic lower back pain    Chronic pain syndrome    Chronic systolic (congestive) heart failure (HCC)    CKD (chronic kidney disease)    Depression    Diabetes (HCC)    Diabetic neuropathy (HCC)    GERD (gastroesophageal reflux disease)    History of kidney cancer    Hypotension    Myocardial infarction [facility])     Past Surgical History Past Surgical History:  Procedure Laterality Date   BACK SURGERY     CARDIAC DEFIBRILLATOR PLACEMENT     In Situ   KIDNEY CYST REMOVAL Right    WISDOM TOOTH EXTRACTION      Family History family history includes Cancer in his paternal grandmother; Diabetes in his brother, brother, maternal grandfather, maternal grandmother, mother, and sister; Heart failure in his maternal grandfather, maternal grandmother, and mother.  Social History Social History   Socioeconomic History   Marital status: Widowed    Spouse name: Not on file   Number of children: 1   Years of education: Not on file   Highest education level: 8th grade  Occupational History   Occupation: Disabled    Comment: 2018  Tobacco Use   Smoking status: Former    Types: Cigarettes    Passive exposure: Never   Smokeless tobacco: Never  Vaping Use   Vaping status: Never Used  Substance and Sexual Activity   Alcohol use: Never   Drug use: Never   Sexual activity: Yes    Partners: Female    Birth control/protection: None  Other Topics Concern   Not on file  Social History  Narrative   Not on file   Social Drivers of Health   Financial Resource Strain: Medium  Risk (01/30/2023)   Overall Financial Resource Strain (CARDIA)    Difficulty of Paying Living Expenses: Somewhat hard  Food Insecurity: No Food Insecurity (07/24/2023)   Hunger Vital Sign    Worried About Running Out of Food in the Last Year: Never true    Ran Out of Food in the Last Year: Never true  Transportation Needs: No Transportation Needs (07/24/2023)   PRAPARE - Administrator, Civil Service (Medical): No    Lack of Transportation (Non-Medical): No  Physical Activity: Insufficiently Active (01/30/2023)   Exercise Vital Sign    Days of Exercise per Week: 2 days    Minutes of Exercise per Session: 20 min  Stress: No Stress Concern Present (01/30/2023)   [employer] of Occupational Health - Occupational Stress Questionnaire    Feeling of Stress : Only a little  Social Connections: Moderately Integrated (01/30/2023)   Social Connection and Isolation Panel [NHANES]    Frequency of Communication with Friends and Family: More than three times a week    Frequency of Social Gatherings with Friends and Family: Three times a week    Attends Religious Services: More than 4 times per year    Active Member of Clubs or Organizations: Yes    Attends Banker Meetings: More than 4 times per year    Marital Status: Widowed  Intimate Partner Violence: Not At Risk (07/24/2023)   Humiliation, Afraid, Rape, and Kick questionnaire    Fear of Current or Ex-Partner: No    Emotionally Abused: No    Physically Abused: No    Sexually Abused: No    Lab Results  Component Value Date   HGBA1C 8.8 (H) 06/22/2023   HGBA1C 8.6 (H) 04/02/2023   HGBA1C 9.8 (H) 02/03/2023   Lab Results  Component Value Date   CHOL 158 06/22/2023   Lab Results  Component Value Date   HDL 30 (L) 06/22/2023   Lab Results  Component Value Date   LDLCALC 70 06/22/2023   Lab Results  Component Value Date   TRIG 367 (H) 06/22/2023   Lab Results  Component Value Date   CHOLHDL 5.3 (H)  06/22/2023   Lab Results  Component Value Date   CREATININE 2.36 (H) 08/13/2023   Lab Results  Component Value Date   GFR 37.24 (L) 06/30/2023   No results found for: "MICROALBUR", "MALB24HUR"    Component Value Date/Time   NA 139 08/13/2023 0815   K 5.0 08/13/2023 0815   CL 102 08/13/2023 0815   CO2 22 08/13/2023 0815   GLUCOSE 149 (H) 08/13/2023 0815   GLUCOSE 183 (H) 07/25/2023 0548   BUN 34 (H) 08/13/2023 0815   CREATININE 2.36 (H) 08/13/2023 0815   CALCIUM 9.2 08/13/2023 0815   PROT 7.1 08/13/2023 0815   ALBUMIN 4.2 08/13/2023 0815   AST 25 08/13/2023 0815   ALT 23 08/13/2023 0815   ALKPHOS 59 08/13/2023 0815   BILITOT 0.2 08/13/2023 0815   GFRNONAA 31 (L) 07/25/2023 0548   GFRAA 41 (L) 01/11/2018 1740      Latest Ref Rng & Units 08/13/2023    8:15 AM 08/03/2023    2:44 PM 07/25/2023    5:48 AM  BMP  Glucose 70 - 99 mg/dL 478  295  621   BUN 6 - 24 mg/dL 34  23  35   Creatinine 0.76 - 1.27  mg/dL 1.61  0.96  0.45   BUN/Creat Ratio 9 - 20 14  11     Sodium 134 - 144 mmol/L 139  138  138   Potassium 3.5 - 5.2 mmol/L 5.0  5.4  4.4   Chloride 96 - 106 mmol/L 102  102  104   CO2 20 - 29 mmol/L 22  21  24    Calcium 8.7 - 10.2 mg/dL 9.2  9.4  8.9        Component Value Date/Time   WBC 6.4 08/03/2023 1444   WBC 8.3 07/24/2023 0537   RBC 4.38 08/03/2023 1444   RBC 4.49 07/24/2023 0537   HGB 12.5 (L) 08/03/2023 1444   HCT 37.4 (L) 08/03/2023 1444   PLT 249 08/03/2023 1444   MCV 85 08/03/2023 1444   MCH 28.5 08/03/2023 1444   MCH 28.5 07/24/2023 0537   MCHC 33.4 08/03/2023 1444   MCHC 34.2 07/24/2023 0537   RDW 13.3 08/03/2023 1444   LYMPHSABS 2.2 08/03/2023 1444   MONOABS 0.7 07/24/2023 0009   EOSABS 0.1 08/03/2023 1444   BASOSABS 0.1 08/03/2023 1444     Parts of this note may have been dictated using voice recognition software. There may be variances in spelling and vocabulary which are unintentional. Not all errors are proofread. Please notify the [facility] if  any discrepancies are noted or if the meaning of any statement is not clear.

## 2023-08-31 ENCOUNTER — Encounter: Payer: Self-pay | Admitting: "Endocrinology

## 2023-09-03 ENCOUNTER — Telehealth: Payer: Self-pay | Admitting: Physician Assistant

## 2023-09-03 NOTE — Telephone Encounter (Signed)
 Inbound call from patient, calling to reschedule his procedure with Dr. Venice Gillis on 5/19. Patient is requesting something the week after the 19.

## 2023-09-03 NOTE — Telephone Encounter (Signed)
 Returned call to the patient who is aware that procedure has been rescheduled to 11-11-23 at 730am.  New instructions sent to patient via MyChart.  Patient agreed to plan and verbalized understanding.

## 2023-09-06 ENCOUNTER — Telehealth: Payer: Self-pay

## 2023-09-06 NOTE — Telephone Encounter (Signed)
 Copied from CRM [id number]. Topic: Clinical - Order For Equipment >> Sep 06, 2023 12:35 PM Crispin Dolphin wrote: Reason for CRM: Jenine Mix called from [facility]. Spoke with patient. Patient was not interested in getting Omnipod. Had concerns about when his wife used it. Caller wanted provider to know. Thank You

## 2023-09-17 NOTE — Progress Notes (Unsigned)
 Subjective:  Patient ID: Greg Esparza, male    DOB: Mar 30, 1966  Age: 58 y.o. MRN: [id number]  No chief complaint on file.   HPI:     08/03/2023    1:51 PM 05/04/2023   10:24 AM 02/03/2023    3:16 PM  Depression screen PHQ 2/9  Decreased Interest 3 1 1   Down, Depressed, Hopeless 0 0 2  PHQ - 2 Score 3 1 3   Altered sleeping 2 0 0  Tired, decreased energy 3 2 3   Change in appetite 2 0 3  Feeling bad or failure about yourself  1 0 0  Trouble concentrating 0 2 1  Moving slowly or fidgety/restless 0 1 0  Suicidal thoughts 0 0 0  PHQ-9 Score 11 6 10   Difficult doing work/chores Not difficult at all Somewhat difficult Extremely dIfficult        05/04/2023   10:24 AM  Fall Risk   Falls in the past year? 0  Number falls in past yr: 0  Injury with Fall? 0  Risk for fall due to : No Fall Risks  Follow up Falls evaluation completed    Patient Care Team: Odilia Bennett, [place] as PCP - General (Physician Assistant) Tally Faes., MD as Referring Physician (Neurology) Esther Hem, MD as Referring Physician (Cardiology) Ples Brim as Physician Assistant (Pain Medicine) Rosemarie Conquest Karlene Overcast, DPM as Consulting Physician (Podiatry) Sherrine Dolly Rhodia Cera, MD as Consulting Physician (Urology) Alfredia Annas, [place] (Cardiology)   Review of Systems  Current Outpatient Medications on File Prior to Visit  Medication Sig Dispense Refill   Blood Glucose Monitoring Suppl DEVI 1 each by Does not apply route in the morning, at noon, and at bedtime. May substitute to any manufacturer covered by patient's insurance. 1 each 0   busPIRone  (BUSPAR ) 10 MG tablet TAKE 2 TABLETS BY MOUTH 2 TIMES DAILY. 360 tablet 2   Continuous Glucose Receiver (FREESTYLE LIBRE 3 READER) DEVI 1 (ONE) EACH AS DIRECTED     Continuous Glucose Sensor (FREESTYLE LIBRE 3 SENSOR) MISC 1 each by Other route every 14 (fourteen) days. With 3 sensors provided per month 6 each 1   dapagliflozin  propanediol (FARXIGA )  5 MG TABS tablet Take 1 tablet (5 mg total) by mouth daily before breakfast. 30 tablet 2   esomeprazole (NEXIUM) 40 MG capsule Take 40 mg by mouth daily as needed (acid reflux).  0   Evolocumab  (REPATHA  SURECLICK) 140 MG/ML SOAJ Inject 140 mg into the skin every 14 (fourteen) days. 2 mL 2   ezetimibe  (ZETIA ) 10 MG tablet TAKE 1 TABLET BY MOUTH EVERY DAY 90 tablet 1   Glucagon  (GVOKE HYPOPEN  1-PACK) 1 MG/0.2ML SOAJ Inject 1 Pen into the skin as directed. 0.2 mL 3   insulin  lispro (HUMALOG  KWIKPEN) 100 UNIT/ML KwikPen Inject 5-10 Units into the skin 3 (three) times daily. 15 min before meals 15 mL 3   JARDIANCE  25 MG TABS tablet Take 1 tablet (25 mg total) by mouth daily. (Patient taking differently: Take 12.5 mg by mouth daily.) 30 tablet 3   LANTUS  SOLOSTAR 100 UNIT/ML Solostar Pen Inject 30 Units into the skin daily. 15 mL 6   loperamide  (IMODIUM ) 2 MG capsule Take 1 capsule (2 mg total) by mouth as needed for diarrhea or loose stools. 30 capsule 0   midodrine  (PROAMATINE ) 2.5 MG tablet TAKE 1 TABLET (2.5 MG TOTAL) BY MOUTH 3 (THREE) TIMES DAILY WITH MEALS. (Patient taking differently: Take 2.5 mg by mouth daily.) 270  tablet 2   mupirocin  ointment (BACTROBAN ) 2 % Apply 1 Application topically daily as needed (wound care). 30 g 1   naloxone (NARCAN) nasal spray 4 mg/0.1 mL      oxyCODONE  (ROXICODONE ) 15 MG immediate release tablet Take 15 mg by mouth every 4 (four) hours as needed for pain.   0   pregabalin  (LYRICA ) 75 MG capsule Take 75 mg by mouth 3 (three) times daily.     RYBELSUS  14 MG TABS TAKE 1 TABLET (14 MG TOTAL) BY MOUTH DAILY (Patient taking differently: Take 7 tablets by mouth daily.) 30 tablet 1   TRINTELLIX  20 MG TABS tablet TAKE 1 TABLET BY MOUTH EVERY DAY 30 tablet 2   Vitamin D, Ergocalciferol, (DRISDOL) 50000 units CAPS capsule Take 50,000 Units by mouth every Tuesday.  0   Current Facility-Administered Medications on File Prior to Visit  Medication Dose Route Frequency Provider  Last Rate Last Admin   magnesium  citrate solution 1 Bottle  1 Bottle Oral Once Manny, Harvey Linen., MD       Past Medical History:  Diagnosis Date   AICD (automatic cardioverter/defibrillator) present    Anxiety    Arthritis    Cardiac defibrillator in situ    Cardiomyopathy [facility])    CHF (congestive heart failure) (HCC)    Chronic lower back pain    Chronic pain syndrome    Chronic systolic (congestive) heart failure (HCC)    CKD (chronic kidney disease)    Depression    Diabetes (HCC)    Diabetic neuropathy (HCC)    GERD (gastroesophageal reflux disease)    History of kidney cancer    Hypotension    Myocardial infarction [facility])    Past Surgical History:  Procedure Laterality Date   BACK SURGERY     CARDIAC DEFIBRILLATOR PLACEMENT     In Situ   KIDNEY CYST REMOVAL Right    WISDOM TOOTH EXTRACTION      Family History  Problem Relation Age of Onset   Heart failure Mother    Diabetes Mother    Diabetes Sister    Diabetes Brother    Diabetes Brother    Heart failure Maternal Grandmother    Diabetes Maternal Grandmother    Heart failure Maternal Grandfather    Diabetes Maternal Grandfather    Cancer Paternal Grandmother        unknown   Social History   Socioeconomic History   Marital status: Widowed    Spouse name: Not on file   Number of children: 1   Years of education: Not on file   Highest education level: 8th grade  Occupational History   Occupation: Disabled    Comment: 2018  Tobacco Use   Smoking status: Former    Types: Cigarettes    Passive exposure: Never   Smokeless tobacco: Never  Vaping Use   Vaping status: Never Used  Substance and Sexual Activity   Alcohol use: Never   Drug use: Never   Sexual activity: Yes    Partners: Female    Birth control/protection: None  Other Topics Concern   Not on file  Social History Narrative   Not on file   Social Drivers of Health   Financial Resource Strain: Medium Risk (01/30/2023)   Overall  Financial Resource Strain (CARDIA)    Difficulty of Paying Living Expenses: Somewhat hard  Food Insecurity: No Food Insecurity (07/24/2023)   Hunger Vital Sign    Worried About Running Out of Food in the Last  Year: Never true    Ran Out of Food in the Last Year: Never true  Transportation Needs: No Transportation Needs (07/24/2023)   PRAPARE - Administrator, Civil Service (Medical): No    Lack of Transportation (Non-Medical): No  Physical Activity: Insufficiently Active (01/30/2023)   Exercise Vital Sign    Days of Exercise per Week: 2 days    Minutes of Exercise per Session: 20 min  Stress: No Stress Concern Present (01/30/2023)   [employer] of Occupational Health - Occupational Stress Questionnaire    Feeling of Stress : Only a little  Social Connections: Moderately Integrated (01/30/2023)   Social Connection and Isolation Panel [NHANES]    Frequency of Communication with Friends and Family: More than three times a week    Frequency of Social Gatherings with Friends and Family: Three times a week    Attends Religious Services: More than 4 times per year    Active Member of Clubs or Organizations: Yes    Attends Banker Meetings: More than 4 times per year    Marital Status: Widowed    Objective:  There were no vitals taken for this visit.     08/30/2023    1:23 PM 08/03/2023    2:37 PM 08/03/2023    1:56 PM  BP/Weight  Systolic BP 122 104 98  Diastolic BP 78 72 58  Wt. (Lbs) 184  179.8  BMI 27.98 kg/m2  27.34 kg/m2    Physical Exam  Diabetic Foot Exam - Simple   No data filed      Lab Results  Component Value Date   WBC 6.4 08/03/2023   HGB 12.5 (L) 08/03/2023   HCT 37.4 (L) 08/03/2023   PLT 249 08/03/2023   GLUCOSE 149 (H) 08/13/2023   CHOL 158 06/22/2023   TRIG 367 (H) 06/22/2023   HDL 30 (L) 06/22/2023   LDLCALC 70 06/22/2023   ALT 23 08/13/2023   AST 25 08/13/2023   NA 139 08/13/2023   K 5.0 08/13/2023   CL 102 08/13/2023    CREATININE 2.36 (H) 08/13/2023   BUN 34 (H) 08/13/2023   CO2 22 08/13/2023   TSH 1.190 02/03/2023   HGBA1C 8.8 (H) 06/22/2023      Assessment & Plan:  There are no diagnoses linked to this encounter.   No orders of the defined types were placed in this encounter.   No orders of the defined types were placed in this encounter.    Follow-up: No follow-ups on file.   I,Lauren M Auman,acting as a Neurosurgeon for [employer], PA.,have documented all relevant documentation on the behalf of Odilia Bennett, PA,as directed by  Odilia Bennett, PA while in the presence of Odilia Bennett, [place].   An After Visit Summary was printed and given to the patient.  Odilia Bennett, [place] [facility] [phone]

## 2023-09-21 ENCOUNTER — Encounter: Payer: Self-pay | Admitting: Physician Assistant

## 2023-09-21 ENCOUNTER — Ambulatory Visit: Payer: Medicaid Other | Admitting: Physician Assistant

## 2023-09-21 VITALS — BP 88/62 | HR 73 | Temp 97.9°F | Ht 68.0 in | Wt 184.8 lb

## 2023-09-21 DIAGNOSIS — E1169 Type 2 diabetes mellitus with other specified complication: Secondary | ICD-10-CM

## 2023-09-21 DIAGNOSIS — E1165 Type 2 diabetes mellitus with hyperglycemia: Secondary | ICD-10-CM | POA: Diagnosis not present

## 2023-09-21 DIAGNOSIS — N1832 Chronic kidney disease, stage 3b: Secondary | ICD-10-CM

## 2023-09-21 DIAGNOSIS — J302 Other seasonal allergic rhinitis: Secondary | ICD-10-CM

## 2023-09-21 DIAGNOSIS — E1140 Type 2 diabetes mellitus with diabetic neuropathy, unspecified: Secondary | ICD-10-CM

## 2023-09-21 DIAGNOSIS — E782 Mixed hyperlipidemia: Secondary | ICD-10-CM

## 2023-09-21 DIAGNOSIS — Z794 Long term (current) use of insulin: Secondary | ICD-10-CM | POA: Diagnosis not present

## 2023-09-21 DIAGNOSIS — I5022 Chronic systolic (congestive) heart failure: Secondary | ICD-10-CM

## 2023-09-21 MED ORDER — FLUTICASONE PROPIONATE 50 MCG/ACT NA SUSP: 2.0000 | Freq: Every day | NASAL | 6 refills | Status: DC

## 2023-09-21 NOTE — Assessment & Plan Note (Signed)
 Controlled Continue to monitor diet and exercise Labs drawn today Will adjust treatment based on results Lab Results  Component Value Date   LDLCALC 70 06/22/2023

## 2023-09-21 NOTE — Assessment & Plan Note (Signed)
 Blood sugar levels improving. Insulin  regimen adjusted. Omnipod insulin  pump on hold. Blood work needed before next visit. - Obtain blood work including A1c, kidney and liver function, blood count, and cholesterol before next visit.

## 2023-09-21 NOTE — Assessment & Plan Note (Signed)
 Controlled Continue to monitor BP  Increase [place] to twice daily for 1 week If BP staying low increase to three times a day. Let us  know if you have any new or worsening symptoms BP Readings from Last 3 Encounters:  09/21/23 (!) 88/62  08/30/23 122/78  08/03/23 104/72

## 2023-09-21 NOTE — Patient Instructions (Signed)
 VISIT SUMMARY:  During your visit, we discussed your concerns about low blood pressure, follow-up on cardiac testing, and management of your diabetes. We also reviewed your current medications and made some adjustments to better control your blood pressure. Additionally, we talked about the stress you are experiencing due to your mother's health issues.  YOUR PLAN:  -CORONARY ARTERY DISEASE WITH POTENTIAL BLOCKAGE: Coronary artery disease is a condition where the blood vessels supplying your heart are narrowed or blocked. You recently had a stress test that suggested a possible blockage at the back of your heart. We are waiting for a cardiac PET scan at [facility] to get more information. This scan will not require contrast dye, which is important because you have only one kidney.  -HYPERTENSION: Hypertension, or high blood pressure, is when the force of the blood against your artery walls is too high. Your blood pressure has been low due to a misunderstanding about your medication dosage. We have adjusted your blood pressure medication to be taken twice daily. Please continue to monitor your blood pressure at home.  -DIABETES MELLITUS: Diabetes mellitus is a condition where your blood sugar levels are too high. Your blood sugar levels have been improving with your current insulin  regimen. We will need to obtain blood work, including A1c, kidney and liver function, blood count, and cholesterol levels before your next visit.  -CHRONIC KIDNEY DISEASE WITH SINGLE KIDNEY: Chronic kidney disease means your kidneys are damaged and can't filter blood as well as they should. Since you have only one kidney, we need to be cautious with certain imaging tests. We will continue to monitor your kidney function, especially in relation to your diabetes management.  INSTRUCTIONS:  Please contact the cardiology office to follow up on the scheduling of your cardiac PET scan. Continue to monitor your blood  pressure at home and take your medication twice daily as instructed. Obtain the necessary blood work before your next visit to assess your A1c, kidney and liver function, blood count, and cholesterol levels.

## 2023-09-21 NOTE — Assessment & Plan Note (Signed)
 Single kidney requires caution with contrast in imaging. Monitoring kidney function with diabetes management.

## 2023-09-21 NOTE — Assessment & Plan Note (Signed)
 Controlled Continue using flonase as directed Will adjust treatment depending on symptoms

## 2023-09-21 NOTE — Assessment & Plan Note (Signed)
 Controlled Denies any worsening or changing symptoms Continue to monitor for any new wounds or increased numbness

## 2023-09-22 LAB — CBC WITH DIFFERENTIAL/PLATELET
Basophils Absolute: 0 10*3/uL (ref 0.0–0.2)
Basos: 1 %
EOS (ABSOLUTE): 0.1 10*3/uL (ref 0.0–0.4)
Eos: 2 %
Hematocrit: 40.3 % (ref 37.5–51.0)
Hemoglobin: 13.1 g/dL (ref 13.0–17.7)
Immature Grans (Abs): 0 10*3/uL (ref 0.0–0.1)
Immature Granulocytes: 0 %
Lymphocytes Absolute: 1.7 10*3/uL (ref 0.7–3.1)
Lymphs: 31 %
MCH: 27.7 pg (ref 26.6–33.0)
MCHC: 32.5 g/dL (ref 31.5–35.7)
MCV: 85 fL (ref 79–97)
Monocytes Absolute: 0.5 10*3/uL (ref 0.1–0.9)
Monocytes: 9 %
Neutrophils Absolute: 3.2 10*3/uL (ref 1.4–7.0)
Neutrophils: 57 %
Platelets: 225 10*3/uL (ref 150–450)
RBC: 4.73 x10E6/uL (ref 4.14–5.80)
RDW: 12.5 % (ref 11.6–15.4)
WBC: 5.4 10*3/uL (ref 3.4–10.8)

## 2023-09-22 LAB — COMPREHENSIVE METABOLIC PANEL WITH GFR
ALT: 16 IU/L (ref 0–44)
AST: 22 IU/L (ref 0–40)
Albumin: 4.1 g/dL (ref 3.8–4.9)
Alkaline Phosphatase: 62 IU/L (ref 44–121)
BUN/Creatinine Ratio: 13 (ref 9–20)
BUN: 27 mg/dL — ABNORMAL HIGH (ref 6–24)
Bilirubin Total: 0.2 mg/dL (ref 0.0–1.2)
CO2: 20 mmol/L (ref 20–29)
Calcium: 9.2 mg/dL (ref 8.7–10.2)
Chloride: 104 mmol/L (ref 96–106)
Creatinine, Ser: 2.05 mg/dL — ABNORMAL HIGH (ref 0.76–1.27)
Globulin, Total: 2.9 g/dL (ref 1.5–4.5)
Glucose: 155 mg/dL — ABNORMAL HIGH (ref 70–99)
Potassium: 4.9 mmol/L (ref 3.5–5.2)
Sodium: 139 mmol/L (ref 134–144)
Total Protein: 7 g/dL (ref 6.0–8.5)
eGFR: 37 mL/min/{1.73_m2} — ABNORMAL LOW (ref >59)

## 2023-09-22 LAB — HEMOGLOBIN A1C
Est. average glucose Bld gHb Est-mCnc: 160 mg/dL
Hgb A1c MFr Bld: 7.2 % — ABNORMAL HIGH (ref 4.8–5.6)

## 2023-09-22 LAB — LIPID PANEL
Chol/HDL Ratio: 2.8 ratio (ref 0.0–5.0)
Cholesterol, Total: 108 mg/dL (ref 100–199)
HDL: 38 mg/dL — ABNORMAL LOW (ref >39)
LDL Chol Calc (NIH): 36 mg/dL (ref 0–99)
Triglycerides: 214 mg/dL — ABNORMAL HIGH (ref 0–149)
VLDL Cholesterol Cal: 34 mg/dL (ref 5–40)

## 2023-09-23 ENCOUNTER — Ambulatory Visit: Payer: Self-pay | Admitting: Physician Assistant

## 2023-09-28 ENCOUNTER — Other Ambulatory Visit: Payer: Self-pay

## 2023-09-29 ENCOUNTER — Ambulatory Visit: Admitting: "Endocrinology

## 2023-09-29 ENCOUNTER — Encounter: Payer: Self-pay | Admitting: "Endocrinology

## 2023-09-29 VITALS — BP 130/80 | HR 84 | Ht 68.0 in

## 2023-09-29 DIAGNOSIS — Z794 Long term (current) use of insulin: Secondary | ICD-10-CM

## 2023-09-29 DIAGNOSIS — E782 Mixed hyperlipidemia: Secondary | ICD-10-CM | POA: Diagnosis not present

## 2023-09-29 DIAGNOSIS — Z7984 Long term (current) use of oral hypoglycemic drugs: Secondary | ICD-10-CM | POA: Diagnosis not present

## 2023-09-29 DIAGNOSIS — E1165 Type 2 diabetes mellitus with hyperglycemia: Secondary | ICD-10-CM

## 2023-09-29 NOTE — Progress Notes (Signed)
 Outpatient Endocrinology Note [name], MD  09/29/23   Greg Esparza [birth year] [id number]  Referring Provider: Odilia Bennett, PA Primary Care Provider: Odilia Bennett, PA Reason for consultation: Subjective   Assessment & Plan  Diagnoses and all orders for this visit:  Uncontrolled type 2 diabetes mellitus with hyperglycemia (HCC) -     Microalbumin / creatinine urine ratio  Long-term insulin  use (HCC)  Long term (current) use of oral hypoglycemic drugs  Mixed hypercholesterolemia and hypertriglyceridemia   Diabetes Type II complicated by neuropathy, nephropathy, defibrillator Lab Results  Component Value Date   GFR 37.24 (L) 06/30/2023   Hba1c goal less than 7, current Hba1c is  Lab Results  Component Value Date   HGBA1C 7.2 (H) 09/21/2023   Will recommend the following: Lantus  25 units qam  Jardiance  25 mg every day half a pill (per cardiologist, due to dizziness needing midodrine ) Rybelsus  14 mg everyday: alternating half a pill with one pill  due to nausea), pt will attempt 14 mg every day again to see tolerance Humalog  4 units with brunch, and 6-7 units with supper 15 min before meals  No known contraindications/side effects to any of above medications Has Glucagon  - discussed use on 07/30/23    -Last LD and Tg are as follows: Lab Results  Component Value Date   LDLCALC 36 09/21/2023    Lab Results  Component Value Date   TRIG 214 (H) 09/21/2023   -On repatha  and zetia , not on statin due to aches  -Follow low fat diet and exercise   -Blood pressure goal <140/90 - Microalbumin/creatinine goal is < 30 -Last MA/Cr is as follows: No results found for: "MICROALBUR", "MALB24HUR" -not on ACE/ARB -diet changes including salt restriction -limit eating outside -counseled BP targets per standards of diabetes care -uncontrolled blood pressure can lead to retinopathy, nephropathy and cardiovascular and atherosclerotic heart disease  Reviewed and  counseled on: -A1C target -Blood sugar targets -Complications of uncontrolled diabetes  -Checking blood sugar before meals and bedtime and bring log next visit -All medications with mechanism of action and side effects -Hypoglycemia management: rule of 15's, Glucagon  Emergency Kit and medical alert ID -low-carb low-fat plate-method diet -At least 20 minutes of physical activity per day -Annual dilated retinal eye exam and foot exam -compliance and follow up needs -follow up as scheduled or earlier if problem gets worse  Call if blood sugar is less than 70 or consistently above 250    Take a 15 gm snack of carbohydrate at bedtime before you go to sleep if your blood sugar is less than 100.    If you are going to fast after midnight for a test or procedure, ask your physician for instructions on how to reduce/decrease your insulin  dose.    Call if blood sugar is less than 70 or consistently above 250  -Treating a low sugar by rule of 15  (15 gms of sugar every 15 min until sugar is more than 70) If you feel your sugar is low, test your sugar to be sure If your sugar is low (less than 70), then take 15 grams of a fast acting Carbohydrate (3-4 glucose tablets or glucose gel or 4 ounces of juice or regular soda) Recheck your sugar 15 min after treating low to make sure it is more than 70 If sugar is still less than 70, treat again with 15 grams of carbohydrate          Don't drive the hour of  hypoglycemia  If unconscious/unable to eat or drink by mouth, use glucagon  injection or nasal spray baqsimi and call 911. Can repeat again in 15 min if still unconscious.  No follow-ups on file.   I have reviewed current medications, nurse's notes, allergies, vital signs, past medical and surgical history, family medical history, and social history for this encounter. Counseled patient on symptoms, examination findings, lab findings, imaging results, treatment decisions and monitoring and prognosis.  The patient understood the recommendations and agrees with the treatment plan. All questions regarding treatment plan were fully answered.  [name], MD  09/29/23    History of Present Illness Greg Esparza is a 58 y.o. year old male who presents for follow up of Type II diabetes mellitus.  Greg Esparza was first diagnosed around 2005.   Diabetes education +  Home diabetes regimen: Lantus  28 units qam  Farxiga  5 mg every day, ran out of Jardiance  25 mg every day half a pill  Rybelsus  14 mg everyday half a pill alternating with 14 mg every day  Humalog  4 units with brunch, and 5 units with supper 15 min before meals   COMPLICATIONS +  MI/Stroke, has a defibrillator  -  retinopathy +  neuropathy +  nephropathy  BLOOD SUGAR DATA  CGM interpretation: At today's visit, we reviewed her CGM downloads. The full report is scanned in the media. Reviewing the CGM trends, BG are elevated post dinner, and sometimes low overnight.   Physical Exam  BP 130/80   Pulse 84   Ht 5\' 8"  (1.727 m)   SpO2 98%   BMI 28.10 kg/m    Constitutional: well developed, well nourished Head: normocephalic, atraumatic Eyes: sclera anicteric, no redness Neck: supple Lungs: normal respiratory effort Neurology: alert and oriented Skin: dry, no appreciable rashes Musculoskeletal: no appreciable defects Psychiatric: normal mood and affect Diabetic Foot Exam - Simple   No data filed      Current Medications Patient's Medications  New Prescriptions   No medications on file  Previous Medications   BLOOD GLUCOSE MONITORING SUPPL DEVI    1 each by Does not apply route in the morning, at noon, and at bedtime. May substitute to any manufacturer covered by patient's insurance.   BUSPIRONE  (BUSPAR ) 10 MG TABLET    TAKE 2 TABLETS BY MOUTH 2 TIMES DAILY.   CONTINUOUS GLUCOSE RECEIVER (FREESTYLE LIBRE 3 READER) DEVI    1 (ONE) EACH AS DIRECTED   CONTINUOUS GLUCOSE SENSOR (FREESTYLE LIBRE 3 SENSOR)  MISC    1 each by Other route every 14 (fourteen) days. With 3 sensors provided per month   DAPAGLIFLOZIN  PROPANEDIOL (FARXIGA ) 5 MG TABS TABLET    Take 1 tablet (5 mg total) by mouth daily before breakfast.   ESOMEPRAZOLE (NEXIUM) 40 MG CAPSULE    Take 40 mg by mouth daily as needed (acid reflux).   EVOLOCUMAB  (REPATHA  SURECLICK) 140 MG/ML SOAJ    Inject 140 mg into the skin every 14 (fourteen) days.   EZETIMIBE  (ZETIA ) 10 MG TABLET    TAKE 1 TABLET BY MOUTH EVERY DAY   FLUTICASONE  (FLONASE ) 50 MCG/ACT NASAL SPRAY    Place 2 sprays into both nostrils daily.   GLUCAGON  (GVOKE HYPOPEN  1-PACK) 1 MG/0.2ML SOAJ    Inject 1 Pen into the skin as directed.   INSULIN  LISPRO (HUMALOG  KWIKPEN) 100 UNIT/ML KWIKPEN    Inject 5-10 Units into the skin 3 (three) times daily. 15 min before meals   JARDIANCE  25 MG TABS  TABLET    Take 1 tablet (25 mg total) by mouth daily.   LANTUS  SOLOSTAR 100 UNIT/ML SOLOSTAR PEN    Inject 30 Units into the skin daily.   LOPERAMIDE  (IMODIUM ) 2 MG CAPSULE    Take 1 capsule (2 mg total) by mouth as needed for diarrhea or loose stools.   MIDODRINE  (PROAMATINE ) 2.5 MG TABLET    TAKE 1 TABLET (2.5 MG TOTAL) BY MOUTH 3 (THREE) TIMES DAILY WITH MEALS.   MUPIROCIN  OINTMENT (BACTROBAN ) 2 %    Apply 1 Application topically daily as needed (wound care).   NALOXONE (NARCAN) NASAL SPRAY 4 MG/0.1 ML       OXYCODONE  (ROXICODONE ) 15 MG IMMEDIATE RELEASE TABLET    Take 15 mg by mouth every 4 (four) hours as needed for pain.    PREGABALIN  (LYRICA ) 75 MG CAPSULE    Take 75 mg by mouth 3 (three) times daily.   RYBELSUS  14 MG TABS    TAKE 1 TABLET (14 MG TOTAL) BY MOUTH DAILY   TRINTELLIX  20 MG TABS TABLET    TAKE 1 TABLET BY MOUTH EVERY DAY   VITAMIN D, ERGOCALCIFEROL, (DRISDOL) 50000 UNITS CAPS CAPSULE    Take 50,000 Units by mouth every Tuesday.  Modified Medications   No medications on file  Discontinued Medications   No medications on file    Allergies Allergies  Allergen Reactions    Lipitor [Atorvastatin] Other (See Comments)    Muscle pain   Oxymorphone Other (See Comments)    Urinary retention   Gabapentin Other (See Comments)    Unknown reaction   Lisinopril Cough    cough   Morphine Nausea And Vomiting   Penicillin G Rash    Past Medical History Past Medical History:  Diagnosis Date   AICD (automatic cardioverter/defibrillator) present    Anxiety    Arthritis    Cardiac defibrillator in situ    Cardiomyopathy (HCC)    CHF (congestive heart failure) (HCC)    Chronic lower back pain    Chronic pain syndrome    Chronic systolic (congestive) heart failure (HCC)    CKD (chronic kidney disease)    Depression    Diabetes (HCC)    Diabetic neuropathy (HCC)    GERD (gastroesophageal reflux disease)    History of kidney cancer    Hypotension    Myocardial infarction [facility])     Past Surgical History Past Surgical History:  Procedure Laterality Date   BACK SURGERY     CARDIAC DEFIBRILLATOR PLACEMENT     In Situ   KIDNEY CYST REMOVAL Right    WISDOM TOOTH EXTRACTION      Family History family history includes Cancer in his paternal grandmother; Diabetes in his brother, brother, maternal grandfather, maternal grandmother, mother, and sister; Heart failure in his maternal grandfather, maternal grandmother, and mother.  Social History Social History   Socioeconomic History   Marital status: Widowed    Spouse name: Not on file   Number of children: 1   Years of education: Not on file   Highest education level: 8th grade  Occupational History   Occupation: Disabled    Comment: 2018  Tobacco Use   Smoking status: Former    Types: Cigarettes    Passive exposure: Never   Smokeless tobacco: Never  Vaping Use   Vaping status: Never Used  Substance and Sexual Activity   Alcohol use: Never   Drug use: Never   Sexual activity: Yes    Partners: Female  Birth control/protection: None  Other Topics Concern   Not on file  Social History Narrative    Not on file   Social Drivers of Health   Financial Resource Strain: Medium Risk (01/30/2023)   Overall Financial Resource Strain (CARDIA)    Difficulty of Paying Living Expenses: Somewhat hard  Food Insecurity: No Food Insecurity (07/24/2023)   Hunger Vital Sign    Worried About Running Out of Food in the Last Year: Never true    Ran Out of Food in the Last Year: Never true  Transportation Needs: No Transportation Needs (07/24/2023)   PRAPARE - Administrator, Civil Service (Medical): No    Lack of Transportation (Non-Medical): No  Physical Activity: Insufficiently Active (01/30/2023)   Exercise Vital Sign    Days of Exercise per Week: 2 days    Minutes of Exercise per Session: 20 min  Stress: No Stress Concern Present (01/30/2023)   [employer] of Occupational Health - Occupational Stress Questionnaire    Feeling of Stress : Only a little  Social Connections: Moderately Integrated (01/30/2023)   Social Connection and Isolation Panel [NHANES]    Frequency of Communication with Friends and Family: More than three times a week    Frequency of Social Gatherings with Friends and Family: Three times a week    Attends Religious Services: More than 4 times per year    Active Member of Clubs or Organizations: Yes    Attends Banker Meetings: More than 4 times per year    Marital Status: Widowed  Intimate Partner Violence: Not At Risk (07/24/2023)   Humiliation, Afraid, Rape, and Kick questionnaire    Fear of Current or Ex-Partner: No    Emotionally Abused: No    Physically Abused: No    Sexually Abused: No    Lab Results  Component Value Date   HGBA1C 7.2 (H) 09/21/2023   HGBA1C 8.8 (H) 06/22/2023   HGBA1C 8.6 (H) 04/02/2023   Lab Results  Component Value Date   CHOL 108 09/21/2023   Lab Results  Component Value Date   HDL 38 (L) 09/21/2023   Lab Results  Component Value Date   LDLCALC 36 09/21/2023   Lab Results  Component Value Date   TRIG  214 (H) 09/21/2023   Lab Results  Component Value Date   CHOLHDL 2.8 09/21/2023   Lab Results  Component Value Date   CREATININE 2.05 (H) 09/21/2023   Lab Results  Component Value Date   GFR 37.24 (L) 06/30/2023   No results found for: "MICROALBUR", "MALB24HUR"    Component Value Date/Time   NA 139 09/21/2023 1045   K 4.9 09/21/2023 1045   CL 104 09/21/2023 1045   CO2 20 09/21/2023 1045   GLUCOSE 155 (H) 09/21/2023 1045   GLUCOSE 183 (H) 07/25/2023 0548   BUN 27 (H) 09/21/2023 1045   CREATININE 2.05 (H) 09/21/2023 1045   CALCIUM 9.2 09/21/2023 1045   PROT 7.0 09/21/2023 1045   ALBUMIN 4.1 09/21/2023 1045   AST 22 09/21/2023 1045   ALT 16 09/21/2023 1045   ALKPHOS 62 09/21/2023 1045   BILITOT 0.2 09/21/2023 1045   GFRNONAA 31 (L) 07/25/2023 0548   GFRAA 41 (L) 01/11/2018 1740      Latest Ref Rng & Units 09/21/2023   10:45 AM 08/13/2023    8:15 AM 08/03/2023    2:44 PM  BMP  Glucose 70 - 99 mg/dL 409  811  914   BUN 6 -  24 mg/dL 27  34  23   Creatinine 0.76 - 1.27 mg/dL 8.65  7.84  6.96   BUN/Creat Ratio 9 - 20 13  14  11    Sodium 134 - 144 mmol/L 139  139  138   Potassium 3.5 - 5.2 mmol/L 4.9  5.0  5.4   Chloride 96 - 106 mmol/L 104  102  102   CO2 20 - 29 mmol/L 20  22  21    Calcium 8.7 - 10.2 mg/dL 9.2  9.2  9.4        Component Value Date/Time   WBC 5.4 09/21/2023 1045   WBC 8.3 07/24/2023 0537   RBC 4.73 09/21/2023 1045   RBC 4.49 07/24/2023 0537   HGB 13.1 09/21/2023 1045   HCT 40.3 09/21/2023 1045   PLT 225 09/21/2023 1045   MCV 85 09/21/2023 1045   MCH 27.7 09/21/2023 1045   MCH 28.5 07/24/2023 0537   MCHC 32.5 09/21/2023 1045   MCHC 34.2 07/24/2023 0537   RDW 12.5 09/21/2023 1045   LYMPHSABS 1.7 09/21/2023 1045   MONOABS 0.7 07/24/2023 0009   EOSABS 0.1 09/21/2023 1045   BASOSABS 0.0 09/21/2023 1045     Parts of this note may have been dictated using voice recognition software. There may be variances in spelling and vocabulary which are  unintentional. Not all errors are proofread. Please notify the [facility] if any discrepancies are noted or if the meaning of any statement is not clear.

## 2023-09-30 ENCOUNTER — Ambulatory Visit: Admitting: "Endocrinology

## 2023-09-30 LAB — MICROALBUMIN / CREATININE URINE RATIO
Creatinine, Urine: 126 mg/dL (ref 20–320)
Microalb Creat Ratio: 37 mg/g{creat} — ABNORMAL HIGH (ref <30)
Microalb, Ur: 4.7 mg/dL

## 2023-10-11 ENCOUNTER — Other Ambulatory Visit: Payer: Self-pay | Admitting: Family Medicine

## 2023-11-03 ENCOUNTER — Telehealth: Payer: Self-pay | Admitting: Gastroenterology

## 2023-11-03 ENCOUNTER — Telehealth: Payer: Self-pay

## 2023-11-03 ENCOUNTER — Encounter (HOSPITAL_COMMUNITY): Payer: Self-pay | Admitting: Gastroenterology

## 2023-11-03 NOTE — Telephone Encounter (Signed)
 Patient scheduled on 11-11-23 for colonoscopy at [facility] with Dr Shila.  Anesthesia reviewed his case and he is an established patient with cardiologist, Dr Meldon at [facility]. He has been evaluated for chest tightness and shortness of breath.  Patient is scheduled to have further cardiac scans on 11-18-23. Anesthesia has requested full cardiac work up and cardiac clearance prior to proceeding with colonoscopy.  Patient is aware that colonoscopy has been canceled for 11-11-23.  Patient advised that he should proceed with cardiac scan on 11-18-23.  Patient will make us  aware of results of scan once he has the results and we will reschedule colonoscopy at that time.  Patient agreed to plan and verbalized understanding.  No further questions or concerns.

## 2023-11-03 NOTE — Progress Notes (Signed)
 Debby Mayer   PCP-Craft PA Cardiologist-Akbary MD at [facility] Pulmonologist- n/a   EKG-07/04/23 Echo-n/a Cath-n/a Stress-07/12/23 ICD/PM- ICD Biotronik GLP1-n/a Blood Thinner- n/a  HX: DM, CHF, MI, CKD, ICD. Patient went to see cards MD 06/29/23 was having some DOE and chest tightness, they ordered stress test and then a cardiac PET  11/18/23. Patient said the tightness is every once in a while and DOE is only when walks for longer periods, uses cane d/t back issues. Per pt said they are doing PET for this and they saw possible blockage on one cardiac scan.  Anesthesia Review- Yes- since symptomatic and cardiac tests upcoming, will need cardiac workup and clearance. Office notified 7/9.

## 2023-11-03 NOTE — Telephone Encounter (Signed)
 Procedure:Colonoscopy Procedure date: 11/11/23 Procedure location: [facility] Arrival Time: 6:00 am Spoke with the patient Y/N: Yes Any prep concerns? No  Has the patient obtained the prep from the pharmacy ? Yes Do you have a care partner and transportation: Yes Any additional concerns? No

## 2023-11-05 ENCOUNTER — Other Ambulatory Visit: Payer: Self-pay

## 2023-11-08 ENCOUNTER — Telehealth: Payer: Self-pay

## 2023-11-08 ENCOUNTER — Other Ambulatory Visit: Payer: Self-pay | Admitting: Physician Assistant

## 2023-11-08 NOTE — Telephone Encounter (Signed)
 Copied from CRM [id number]. Topic: Clinical - Prescription Issue >> Nov 08, 2023 10:35 AM Antwanette L wrote: Reason for CRM: The patient is trying to get a refill on TRINTELLIX  20 MG TABS tablet but [facility] told the patient, his provider denied the refill. The patient only has one pill left. A refill request has been submitted. The patient can be contacted at [phone]

## 2023-11-08 NOTE — Telephone Encounter (Unsigned)
 Copied from CRM [id number]. Topic: Clinical - Medication Refill >> Nov 08, 2023 10:31 AM Antwanette L wrote: Medication: TRINTELLIX  20 MG TABS tablet  Has the patient contacted their pharmacy? Yes   This is the patient's preferred pharmacy:  [id number] [place], [place] - [address] [facility] [address] Phone: [phone] Fax: [phone]  Is this the correct pharmacy for this prescription? Yes If no, delete pharmacy and type the correct one.   Has the prescription been filled recently? Yes. Last refilled on 11/05/23 but the provider denied the refill   Is the patient out of the medication? No. The patient has one pill left  Has the patient been seen for an appointment in the last year OR does the patient have an upcoming appointment? Yes. Last ov with Nola Angles PA was on 09/21/23  Can we respond through MyChart? No. Contact the patient by phone at [phone]  Agent: Please be advised that Rx refills may take up to 3 business days. We ask that you follow-up with your pharmacy.

## 2023-11-09 MED ORDER — TRINTELLIX 20 MG PO TABS: 20.0000 mg | ORAL_TABLET | Freq: Every day | ORAL | 2 refills | Status: DC

## 2023-11-11 ENCOUNTER — Ambulatory Visit (HOSPITAL_COMMUNITY): Admit: 2023-11-11 | Admitting: Gastroenterology

## 2023-11-11 SURGERY — COLONOSCOPY
Anesthesia: Monitor Anesthesia Care

## 2023-11-16 ENCOUNTER — Encounter (HOSPITAL_COMMUNITY): Payer: Self-pay | Admitting: Urology

## 2023-11-16 ENCOUNTER — Ambulatory Visit (HOSPITAL_COMMUNITY)
Admission: RE | Admit: 2023-11-16 | Discharge: 2023-11-16 | Disposition: A | Discharge status: Home / Self Care | Source: Ambulatory Visit | Attending: Urology | Admitting: Urology

## 2023-11-16 ENCOUNTER — Other Ambulatory Visit (HOSPITAL_COMMUNITY): Payer: Self-pay | Admitting: Urology

## 2023-11-16 DIAGNOSIS — C641 Malignant neoplasm of right kidney, except renal pelvis: Secondary | ICD-10-CM

## 2023-11-20 ENCOUNTER — Other Ambulatory Visit: Payer: Self-pay | Admitting: Physician Assistant

## 2023-11-22 ENCOUNTER — Telehealth: Payer: Self-pay

## 2023-11-22 NOTE — Telephone Encounter (Signed)
 Copied from CRM [id number]. Topic: Clinical - Prescription Issue >> Nov 22, 2023  8:46 AM Berwyn MATSU wrote: Reason for CRM:  [facility] requesting to speak to PA Craft or office staff regarding medication farxiga  5 mg tabs. Per pharmacist patient only has one kidney and this medication requires surveillance and needs to be very careful and they just want to make sure PA wants to proceed with med. FARXIGA  5 MG TABS tablet Medication Date: 11/21/2023 Department: [facility] Ordering/Authorizing: Milon Cleaves, PA  Order Providers  Prescribing Provider Encounter Provider [place], [place], PA [place], [place], [place]  Outpatient Medication Detail   Disp Refills Start End  FARXIGA  5 MG TABS tablet 30 tablet 2 11/21/2023 -  Sig - Route: TAKE 1 TABLET BY MOUTH DAILY BEFORE BREAKFAST. - Oral  Sent to pharmacy as: FARXIGA  5 MG Tab tablet  E-Prescribing Status: Receipt confirmed by pharmacy (11/21/2023  2:41 PM EDT)   CB# [phone]  May you please assist.

## 2023-11-29 ENCOUNTER — Telehealth: Payer: Self-pay

## 2023-11-29 NOTE — Telephone Encounter (Signed)
 Patient needs to have colonoscopy.  We are waiting for clearance from Dr Meldon at  [facility].  Dr Meldon would like to know if electrocautery will used during colonoscopy as the patient has an ICD.  Please advise.

## 2023-12-06 NOTE — Telephone Encounter (Signed)
 Possible electrocautery use,  if he has a large pedunculated polyp that requires hot snare

## 2023-12-06 NOTE — Telephone Encounter (Signed)
 Information from Dr Shila refaxed to Dr Meldon with her recommendations.  Will wait for response.

## 2023-12-09 NOTE — Telephone Encounter (Signed)
 Reviewed patients chart and found that he had LHC with stent placement on 12-07-23.    I spoke with Rocky at Dr Terie office and asked her to have Dr Meldon to follow up on the request that was faxed to their office so that we can have documentation on whether or not the patient can proceed with colonoscopy.  Will wait for response from Dr Meldon.

## 2023-12-18 ENCOUNTER — Other Ambulatory Visit: Payer: Self-pay | Admitting: Physician Assistant

## 2023-12-18 DIAGNOSIS — I2510 Atherosclerotic heart disease of native coronary artery without angina pectoris: Secondary | ICD-10-CM

## 2023-12-22 ENCOUNTER — Other Ambulatory Visit: Payer: Self-pay | Admitting: Physician Assistant

## 2023-12-23 ENCOUNTER — Encounter: Payer: Self-pay | Admitting: Physician Assistant

## 2023-12-23 ENCOUNTER — Ambulatory Visit: Admitting: Physician Assistant

## 2023-12-23 VITALS — BP 86/60 | HR 80 | Temp 97.4°F | Resp 16 | Ht 68.0 in | Wt 193.0 lb

## 2023-12-23 DIAGNOSIS — N1832 Chronic kidney disease, stage 3b: Secondary | ICD-10-CM | POA: Diagnosis not present

## 2023-12-23 DIAGNOSIS — E1165 Type 2 diabetes mellitus with hyperglycemia: Secondary | ICD-10-CM

## 2023-12-23 DIAGNOSIS — I950 Idiopathic hypotension: Secondary | ICD-10-CM

## 2023-12-23 DIAGNOSIS — I5022 Chronic systolic (congestive) heart failure: Secondary | ICD-10-CM

## 2023-12-23 DIAGNOSIS — T466X5A Adverse effect of antihyperlipidemic and antiarteriosclerotic drugs, initial encounter: Secondary | ICD-10-CM

## 2023-12-23 DIAGNOSIS — R635 Abnormal weight gain: Secondary | ICD-10-CM

## 2023-12-23 DIAGNOSIS — Z794 Long term (current) use of insulin: Secondary | ICD-10-CM | POA: Diagnosis not present

## 2023-12-23 DIAGNOSIS — E782 Mixed hyperlipidemia: Secondary | ICD-10-CM

## 2023-12-23 DIAGNOSIS — E1169 Type 2 diabetes mellitus with other specified complication: Secondary | ICD-10-CM | POA: Diagnosis not present

## 2023-12-23 DIAGNOSIS — J302 Other seasonal allergic rhinitis: Secondary | ICD-10-CM

## 2023-12-23 DIAGNOSIS — G720 Drug-induced myopathy: Secondary | ICD-10-CM

## 2023-12-23 DIAGNOSIS — E1140 Type 2 diabetes mellitus with diabetic neuropathy, unspecified: Secondary | ICD-10-CM

## 2023-12-23 MED ORDER — MIDODRINE HCL 2.5 MG PO TABS: 2.5000 mg | ORAL_TABLET | Freq: Three times a day (TID) | ORAL | 2 refills | Status: AC

## 2023-12-23 NOTE — Telephone Encounter (Signed)
 Received a fax from Dr Terie office on 12-15-23 stating OK to proceed.    I called to Dr Terie office to clarify that it is OK to proceed with colonoscopy and to see if patient can hold Plavix 5 days prior to procedure considering he just had LHC with stent placement on 12-07-23.  [name] with Cardiology will send a note to the nurse for further recommendations.  Patient is scheduled for hospital follow up with Dr Meldon on 12-30-23.  Dr Shila,   Would you prefer the patient follow up in our office with you or one of the APP's prior to rescheduling this patient at the hospital due to the fact he was just recently started on Plavix after stent placement on 12-07-23.  Please advise.

## 2023-12-23 NOTE — Progress Notes (Unsigned)
 Subjective:  Patient ID: Greg Esparza, male    DOB: 11-02-1965  Age: 58 y.o. MRN: [id number]  Chief Complaint  Patient presents with   Medical Management of Chronic Issues    HPI: Discussed the use of AI scribe software for clinical note transcription with the patient, who gave verbal consent to proceed.  History of Present Illness Greg Esparza is a 58 year old male with coronary artery disease and diabetes who presents with medication side effects and blood pressure management issues.  He has been experiencing muscle aches, headaches described as 'pressure', and pressure behind his eyes since starting a statin medication. He feels his blood pressure is high, although it is actually low. He experiences lightheadedness and headaches, which he associates with low blood pressure.  He has been taking one tablet of a medication intended to be taken three times daily.  He was recently taken off Farxiga  and Jardiance  by his kidney doctor, who referred him to another specialist to determine alternative medications. He has only one kidney and is concerned about maintaining its function.  His blood sugar levels drop to 69 mg/dL every night, despite having a snack before bed. He currently takes Lantus  28 units once daily and Humalog  4-7 units with meals. He notes an increased appetite and weight gain, which he attributes to the Lantus .  He is currently taking aspirin, Plavix, and Repatha  injections every other week. He has a history of a cardiac catheterization and stent placement, and he is concerned about the potential need for a colonoscopy and the implications of his blood thinner medications.         12/23/2023    9:43 AM 08/03/2023    1:51 PM 05/04/2023   10:24 AM 02/03/2023    3:16 PM  Depression screen PHQ 2/9  Decreased Interest 2 3 1 1   Down, Depressed, Hopeless 0 0 0 2  PHQ - 2 Score 2 3 1 3   Altered sleeping 2 2 0 0  Tired, decreased energy 2 3 2 3   Change in appetite 0 2 0 3   Feeling bad or failure about yourself  0 1 0 0  Trouble concentrating 2 0 2 1  Moving slowly or fidgety/restless 0 0 1 0  Suicidal thoughts 0 0 0 0  PHQ-9 Score 8 11 6 10   Difficult doing work/chores Somewhat difficult Not difficult at all Somewhat difficult Extremely dIfficult        12/23/2023    9:42 AM  Fall Risk   Falls in the past year? 0  Number falls in past yr: 0  Injury with Fall? 0  Risk for fall due to : No Fall Risks  Follow up Education provided    Patient Care Team: Milon Cleaves, [place] as PCP - General (Physician Assistant) Scot Richardson SAUNDERS., MD as Referring Physician (Neurology) Meldon Lash, MD as Referring Physician (Cardiology) Prescilla Nancyann RIGGERS as Physician Assistant (Pain Medicine) Malvin Marsa FALCON, DPM as Consulting Physician (Podiatry) Devere Lonni Righter, MD as Consulting Physician (Urology) Tolbert Morene ORN, [place] (Cardiology) Alvaro Ricardo KATHEE Raddle., MD as Consulting Physician (Urology)   Review of Systems  Constitutional:  Negative for appetite change, fatigue and fever.  HENT:  Negative for congestion, ear pain, sinus pressure and sore throat.   Respiratory:  Negative for cough, chest tightness, shortness of breath and wheezing.   Cardiovascular:  Negative for chest pain and palpitations.  Gastrointestinal:  Negative for abdominal pain, constipation, diarrhea, nausea and vomiting.  Genitourinary:  Negative for dysuria and hematuria.  Musculoskeletal:  Negative for arthralgias, back pain, joint swelling and myalgias.  Skin:  Negative for rash.  Neurological:  Negative for dizziness, weakness and headaches.  Psychiatric/Behavioral:  Negative for dysphoric mood. The patient is not nervous/anxious.     Current Outpatient Medications on File Prior to Visit  Medication Sig Dispense Refill   aspirin 81 MG chewable tablet Chew 81 mg by mouth.     atorvastatin (LIPITOR) 40 MG tablet Take 40 mg by mouth daily.     Blood Glucose Monitoring Suppl  DEVI 1 each by Does not apply route in the morning, at noon, and at bedtime. May substitute to any manufacturer covered by patient's insurance. 1 each 0   busPIRone  (BUSPAR ) 10 MG tablet TAKE 2 TABLETS BY MOUTH 2 TIMES DAILY. 360 tablet 2   clopidogrel (PLAVIX) 75 MG tablet Take 75 mg by mouth daily.     Continuous Glucose Receiver (FREESTYLE LIBRE 3 READER) DEVI 1 (ONE) EACH AS DIRECTED     Continuous Glucose Sensor (FREESTYLE LIBRE 3 SENSOR) MISC 1 each by Other route every 14 (fourteen) days. With 3 sensors provided per month 6 each 1   esomeprazole (NEXIUM) 40 MG capsule Take 40 mg by mouth daily as needed (acid reflux).  0   Evolocumab  (REPATHA  SURECLICK) 140 MG/ML SOAJ Inject 140 mg into the skin every 14 (fourteen) days. 2 mL 2   ezetimibe  (ZETIA ) 10 MG tablet TAKE 1 TABLET BY MOUTH EVERY DAY 90 tablet 1   fluticasone  (FLONASE ) 50 MCG/ACT nasal spray Place 2 sprays into both nostrils daily. 16 g 6   Glucagon  (GVOKE HYPOPEN  1-PACK) 1 MG/0.2ML SOAJ Inject 1 Pen into the skin as directed. 0.2 mL 3   insulin  lispro (HUMALOG  KWIKPEN) 100 UNIT/ML KwikPen Inject 5-10 Units into the skin 3 (three) times daily. 15 min before meals (Patient taking differently: Inject 4-7 Units into the skin 2 (two) times daily. 15 min before meals.  Pt only been taking insulin  BID.) 15 mL 3   LANTUS  SOLOSTAR 100 UNIT/ML Solostar Pen Inject 30 Units into the skin daily. (Patient taking differently: Inject 28 Units into the skin daily.) 15 mL 6   linaclotide (LINZESS) 290 MCG CAPS capsule Take 290 mcg by mouth daily before breakfast.     mupirocin  ointment (BACTROBAN ) 2 % Apply 1 Application topically daily as needed (wound care). 30 g 1   naloxone (NARCAN) nasal spray 4 mg/0.1 mL      oxyCODONE  (ROXICODONE ) 15 MG immediate release tablet Take 15 mg by mouth every 4 (four) hours as needed for pain.   0   pregabalin  (LYRICA ) 75 MG capsule Take 75 mg by mouth 3 (three) times daily.     RYBELSUS  14 MG TABS TAKE 1 TABLET  (14 MG TOTAL) BY MOUTH DAILY (Patient taking differently: Take 0.5 tablets by mouth daily.) 30 tablet 1   TRINTELLIX  20 MG TABS tablet Take 1 tablet (20 mg total) by mouth daily. 30 tablet 2   loperamide  (IMODIUM ) 2 MG capsule Take 1 capsule (2 mg total) by mouth as needed for diarrhea or loose stools. 30 capsule 0   Vitamin D, Ergocalciferol, (DRISDOL) 50000 units CAPS capsule Take 50,000 Units by mouth every Tuesday. (Patient not taking: Reported on 12/23/2023)  0   No current facility-administered medications on file prior to visit.   Past Medical History:  Diagnosis Date   AICD (automatic cardioverter/defibrillator) present    Anxiety    Arthritis  Cardiac defibrillator in situ    Cardiomyopathy (HCC)    CHF (congestive heart failure) (HCC)    Chronic lower back pain    Chronic pain syndrome    Chronic systolic (congestive) heart failure (HCC)    CKD (chronic kidney disease)    Depression    Diabetes (HCC)    Diabetic neuropathy (HCC)    GERD (gastroesophageal reflux disease)    History of kidney cancer    Hypotension    Myocardial infarction [facility])    Past Surgical History:  Procedure Laterality Date   BACK SURGERY     CARDIAC DEFIBRILLATOR PLACEMENT     In Situ   CORONARY STENT PLACEMENT  2025   KIDNEY CYST REMOVAL Right    KIDNEY SURGERY  2025   WISDOM TOOTH EXTRACTION      Family History  Problem Relation Age of Onset   Heart failure Mother    Diabetes Mother    Diabetes Sister    Diabetes Brother    Diabetes Brother    Heart failure Maternal Grandmother    Diabetes Maternal Grandmother    Heart failure Maternal Grandfather    Diabetes Maternal Grandfather    Cancer Paternal Grandmother        unknown   Social History   Socioeconomic History   Marital status: Widowed    Spouse name: Not on file   Number of children: 1   Years of education: Not on file   Highest education level: 8th grade  Occupational History   Occupation: Disabled    Comment: 2018   Tobacco Use   Smoking status: Former    Types: Cigarettes    Passive exposure: Never   Smokeless tobacco: Never  Vaping Use   Vaping status: Never Used  Substance and Sexual Activity   Alcohol use: Never   Drug use: Never   Sexual activity: Yes    Partners: Female    Birth control/protection: None  Other Topics Concern   Not on file  Social History Narrative   Not on file   Social Drivers of Health   Financial Resource Strain: Medium Risk (01/30/2023)   Overall Financial Resource Strain (CARDIA)    Difficulty of Paying Living Expenses: Somewhat hard  Food Insecurity: No Food Insecurity (07/24/2023)   Hunger Vital Sign    Worried About Running Out of Food in the Last Year: Never true    Ran Out of Food in the Last Year: Never true  Transportation Needs: No Transportation Needs (07/24/2023)   PRAPARE - Administrator, Civil Service (Medical): No    Lack of Transportation (Non-Medical): No  Physical Activity: Insufficiently Active (01/30/2023)   Exercise Vital Sign    Days of Exercise per Week: 2 days    Minutes of Exercise per Session: 20 min  Stress: No Stress Concern Present (01/30/2023)   [employer] of Occupational Health - Occupational Stress Questionnaire    Feeling of Stress : Only a little  Social Connections: Moderately Integrated (01/30/2023)   Social Connection and Isolation Panel    Frequency of Communication with Friends and Family: More than three times a week    Frequency of Social Gatherings with Friends and Family: Three times a week    Attends Religious Services: More than 4 times per year    Active Member of Clubs or Organizations: Yes    Attends Banker Meetings: More than 4 times per year    Marital Status: Widowed    Objective:  BP (!) 86/60   Pulse 80   Temp (!) 97.4 F (36.3 C)   Resp 16   Ht 5' 8 (1.727 m)   Wt 193 lb (87.5 kg)   SpO2 98%   BMI 29.35 kg/m      12/23/2023    9:14 AM 09/29/2023    2:00 PM  09/21/2023   10:10 AM  BP/Weight  Systolic BP 86 130 88  Diastolic BP 60 80 62  Wt. (Lbs) 193    BMI 29.35 kg/m2      Physical Exam Vitals reviewed.  Constitutional:      Appearance: Normal appearance.  Cardiovascular:     Rate and Rhythm: Normal rate and regular rhythm.     Heart sounds: Normal heart sounds.  Pulmonary:     Effort: Pulmonary effort is normal.     Breath sounds: Normal breath sounds.  Abdominal:     General: Bowel sounds are normal.     Palpations: Abdomen is soft.     Tenderness: There is no abdominal tenderness.  Neurological:     Mental Status: He is alert and oriented to person, place, and time.  Psychiatric:        Mood and Affect: Mood normal.        Behavior: Behavior normal.         Lab Results  Component Value Date   WBC 5.4 09/21/2023   HGB 13.1 09/21/2023   HCT 40.3 09/21/2023   PLT 225 09/21/2023   GLUCOSE 155 (H) 09/21/2023   CHOL 108 09/21/2023   TRIG 214 (H) 09/21/2023   HDL 38 (L) 09/21/2023   LDLCALC 36 09/21/2023   ALT 16 09/21/2023   AST 22 09/21/2023   NA 139 09/21/2023   K 4.9 09/21/2023   CL 104 09/21/2023   CREATININE 2.05 (H) 09/21/2023   BUN 27 (H) 09/21/2023   CO2 20 09/21/2023   TSH 1.190 02/03/2023   HGBA1C 7.2 (H) 09/21/2023      Assessment & Plan:  Type 2 diabetes mellitus with hyperglycemia, with long-term current use of insulin  (HCC) Assessment & Plan: Experiencing nocturnal hypoglycemia and weight gain, likely due to Lantus . Endocrinologist plans to increase Rybelsus , but it causes gastrointestinal upset. - Reduce Lantus  to 24 units daily. - Continue Humalog  4-7 units with meals. - Continue Rybelsus  at half dose. - Follow up with endocrinologist next month.  Orders: -     Hemoglobin A1c  Chronic systolic congestive heart failure (HCC) Assessment & Plan: Experiencing nocturnal hypoglycemia and weight gain, likely due to Lantus . Endocrinologist plans to increase Rybelsus , but it causes  gastrointestinal upset. - Reduce Lantus  to 24 units daily. - Continue Humalog  4-7 units with meals. - Continue Rybelsus  at half dose. - Follow up with endocrinologist next month.   Chronic kidney disease, stage 3b (HCC) Assessment & Plan: Recent medication adjustments due to kidney function concerns. Referred to a specialist for medication management post-Farxiga  discontinuation. - Attend appointment with kidney specialist on the 19th. - Avoid nephrotoxic agents and ensure medications are kidney-safe.  Orders: -     Comprehensive metabolic panel with GFR  Mixed hyperlipidemia due to type 2 diabetes mellitus (HCC) Assessment & Plan: Controlled Continue to monitor diet and exercise Labs drawn today Will adjust treatment based on results    Orders: -     CBC with Differential/Platelet -     Lipid panel  Seasonal allergies Assessment & Plan: Controlled Continue using flonase  as directed Will adjust treatment depending on symptoms  Idiopathic hypotension Assessment & Plan: Experiencing low blood pressure, possibly contributing to headaches and lightheadedness. Currently on midodrine . - Increase midodrine  to two tablets daily with meals and monitor blood pressure. - Consider increasing to three tablets if blood pressure remains low or symptoms persist.  Orders: -     Midodrine  HCl; Take 1 tablet (2.5 mg total) by mouth 3 (three) times daily with meals.  Dispense: 270 tablet; Refill: 2  Statin myopathy Assessment & Plan: Experiencing muscle aches and headaches attributed to statin use. Statin discontinued due to side effects. - Discontinue statin. - Continue Repatha  and Zetia  for cholesterol management. - Monitor for resolution of muscle aches and headaches.   Weight gain Assessment & Plan: Reports significant weight gain and increased appetite, potentially related to Lantus . Weight gain is concerning due to comorbid conditions. - Monitor weight and dietary  intake. - Adjust Lantus  to 24 units.        Meds ordered this encounter  Medications   midodrine  (PROAMATINE ) 2.5 MG tablet    Sig: Take 1 tablet (2.5 mg total) by mouth 3 (three) times daily with meals.    Dispense:  270 tablet    Refill:  2    Orders Placed This Encounter  Procedures   CBC with Differential/Platelet   Comprehensive metabolic panel with GFR   Lipid panel   Hemoglobin A1c     Follow-up: Return in about 3 months (around 03/24/2024) for Chronic, Greg.   I,Lauren M Auman,acting as a Neurosurgeon for [employer], PA.,have documented all relevant documentation on the behalf of Greg Angles, PA,as directed by  Greg Angles, PA while in the presence of Greg Esparza, [place].   LILLETTE Kato I Leal-Borjas,acting as a scribe for Greg Angles, PA.,have documented all relevant documentation on the behalf of Greg Angles, PA,as directed by  Greg Angles, PA while in the presence of Greg Esparza, [place].    An After Visit Summary was printed and given to the patient.  Greg Esparza, [place] [facility] [phone]

## 2023-12-23 NOTE — Telephone Encounter (Signed)
 Please schedule office visit with me next available appointment to discuss procedure given recent left heart cath with stent placement.

## 2023-12-24 NOTE — Telephone Encounter (Signed)
 Left message for patient to return call to discuss appointment scheduled for 02-29-24 at 850am with Dr Shila.  Will continue efforts.

## 2023-12-26 DIAGNOSIS — G720 Drug-induced myopathy: Secondary | ICD-10-CM | POA: Insufficient documentation

## 2023-12-26 DIAGNOSIS — R635 Abnormal weight gain: Secondary | ICD-10-CM | POA: Insufficient documentation

## 2023-12-26 NOTE — Assessment & Plan Note (Signed)
 Experiencing nocturnal hypoglycemia and weight gain, likely due to Lantus . Endocrinologist plans to increase Rybelsus , but it causes gastrointestinal upset. - Reduce Lantus  to 24 units daily. - Continue Humalog  4-7 units with meals. - Continue Rybelsus  at half dose. - Follow up with endocrinologist next month.

## 2023-12-26 NOTE — Assessment & Plan Note (Signed)
 Reports significant weight gain and increased appetite, potentially related to Lantus . Weight gain is concerning due to comorbid conditions. - Monitor weight and dietary intake. - Adjust Lantus  to 24 units.

## 2023-12-26 NOTE — Assessment & Plan Note (Signed)
 Recent medication adjustments due to kidney function concerns. Referred to a specialist for medication management post-Farxiga  discontinuation. - Attend appointment with kidney specialist on the 19th. - Avoid nephrotoxic agents and ensure medications are kidney-safe.

## 2023-12-26 NOTE — Assessment & Plan Note (Signed)
 Experiencing muscle aches and headaches attributed to statin use. Statin discontinued due to side effects. - Discontinue statin. - Continue Repatha  and Zetia  for cholesterol management. - Monitor for resolution of muscle aches and headaches.

## 2023-12-26 NOTE — Assessment & Plan Note (Signed)
 Experiencing low blood pressure, possibly contributing to headaches and lightheadedness. Currently on midodrine . - Increase midodrine  to two tablets daily with meals and monitor blood pressure. - Consider increasing to three tablets if blood pressure remains low or symptoms persist.

## 2023-12-26 NOTE — Assessment & Plan Note (Signed)
 Controlled Continue using flonase as directed Will adjust treatment depending on symptoms

## 2023-12-26 NOTE — Assessment & Plan Note (Signed)
 Patient reports one episode of sugar dropping to 52. -Provide patient with emergency glucose pen for use in instances of severe hypoglycemia.

## 2023-12-26 NOTE — Assessment & Plan Note (Signed)
 Controlled Continue to monitor diet and exercise Labs drawn today Will adjust treatment based on results

## 2023-12-26 NOTE — Assessment & Plan Note (Signed)
 Controlled Denies any worsening or changing symptoms Continue to monitor for any new wounds or increased numbness

## 2023-12-28 NOTE — Telephone Encounter (Signed)
 Left message for patient to return call office to discuss appointment.  Will continue efforts.

## 2023-12-29 ENCOUNTER — Encounter: Payer: Self-pay | Admitting: "Endocrinology

## 2023-12-29 ENCOUNTER — Ambulatory Visit: Admitting: "Endocrinology

## 2023-12-29 VITALS — BP 122/80 | HR 87 | Ht 68.0 in | Wt 197.0 lb

## 2023-12-29 DIAGNOSIS — Z794 Long term (current) use of insulin: Secondary | ICD-10-CM | POA: Diagnosis not present

## 2023-12-29 DIAGNOSIS — Z7984 Long term (current) use of oral hypoglycemic drugs: Secondary | ICD-10-CM

## 2023-12-29 DIAGNOSIS — E1165 Type 2 diabetes mellitus with hyperglycemia: Secondary | ICD-10-CM | POA: Diagnosis not present

## 2023-12-29 DIAGNOSIS — E782 Mixed hyperlipidemia: Secondary | ICD-10-CM | POA: Diagnosis not present

## 2023-12-29 NOTE — Patient Instructions (Addendum)
 Will recommend the following: Lantus  24 units qam  Humalog  6 units with brunch, and 8 units with supper 15 min before meals  _______________   Goals of DM therapy:  Morning Fasting blood sugar: 80-140  Blood sugar before meals: 80-140 Bed time blood sugar: 100-150  A1C <7%, limited only by hypoglycemia  1.Diabetes medications and their side effects discussed, including hypoglycemia    2. Check blood glucose:  a) Always check blood sugars before driving. Please see below (under hypoglycemia) on how to manage b) Check a minimum of 3 times/day or more as needed when having symptoms of hypoglycemia.   c) Try to check blood glucose before sleeping/in the middle of the night to ensure that it is remaining stable and not dropping less than 100 d) Check blood glucose more often if sick  3. Diet: a) 3 meals per day schedule b: Restrict carbs to 60-70 grams (4 servings) per meal c) Colorful vegetables - 3 servings a day, and low sugar fruit 2 servings/day Plate control method: 1/4 plate protein, 1/4 starch, 1/2 green, yellow, or red vegetables d) Avoid carbohydrate snacks unless hypoglycemic episode, or increased physical activity  4. Regular exercise as tolerated, preferably 3 or more hours a week  5. Hypoglycemia: a)  Do not drive or operate machinery without first testing blood glucose to assure it is over 90 mg%, or if dizzy, lightheaded, not feeling normal, etc, or  if foot or leg is numb or weak. b)  If blood glucose less than 70, take four 5gm Glucose tabs or 15-30 gm Glucose gel.  Repeat every 15 min as needed until blood sugar is >100 mg/dl. If hypoglycemia persists then call 911.   6. Sick day management: a) Check blood glucose more often b) Continue usual therapy if blood sugars are elevated.   7. Contact the doctor immediately if blood glucose is frequently <60 mg/dl, or an episode of severe hypoglycemia occurs (where someone had to give you glucose/  glucagon  or if you  passed out from a low blood glucose), or if blood glucose is persistently >350 mg/dl, for further management  8. A change in level of physical activity or exercise and a change in diet may also affect your blood sugar. Check blood sugars more often and call if needed.  Instructions: 1. Bring glucose meter, blood glucose records on every visit for review 2. Continue to follow up with primary care physician and other providers for medical care 3. Yearly eye  and foot exam 4. Please get blood work done prior to the next appointment

## 2023-12-29 NOTE — Progress Notes (Signed)
 Outpatient Endocrinology Note Greg Birmingham, MD  12/29/23   Greg Esparza 12/31/1965 [id number]  Referring Provider: Milon Cleaves, PA Primary Care Provider: Milon Cleaves, PA Reason for consultation: Subjective   Assessment & Plan  Diagnoses and all orders for this visit:  Uncontrolled type 2 diabetes mellitus with hyperglycemia (HCC)  Long-term insulin  use (HCC)  Long term (current) use of oral hypoglycemic drugs  Mixed hypercholesterolemia and hypertriglyceridemia    Diabetes Type II complicated by neuropathy, nephropathy, defibrillator Lab Results  Component Value Date   GFR 37.24 (L) 06/30/2023   Hba1c goal less than 7, current Hba1c is  Lab Results  Component Value Date   HGBA1C 7.2 (H) 09/21/2023   Will recommend the following: Lantus  24 units qam  Humalog  6 units with brunch, and 8 units with supper 15 min before meals  Stopped Jardiance  25 mg every day half a pill/farxiga  5 mg qd (per cardiologist, due to dizziness needing midodrine ) No known contraindications/side effects to any of above medications Has Glucagon  - discussed use on 07/30/23    -Last LD and Tg are as follows: Lab Results  Component Value Date   LDLCALC 36 09/21/2023    Lab Results  Component Value Date   TRIG 214 (H) 09/21/2023   -On repatha  and zetia , not on statin due to aches  -Follow low fat diet and exercise   -Blood pressure goal <140/90 - Microalbumin/creatinine goal is < 30 -Last MA/Cr is as follows: Lab Results  Component Value Date   MICROALBUR 4.7 09/29/2023   -not on ACE/ARB -diet changes including salt restriction -limit eating outside -counseled BP targets per standards of diabetes care -uncontrolled blood pressure can lead to retinopathy, nephropathy and cardiovascular and atherosclerotic heart disease  Reviewed and counseled on: -A1C target -Blood sugar targets -Complications of uncontrolled diabetes  -Checking blood sugar before meals and bedtime  and bring log next visit -All medications with mechanism of action and side effects -Hypoglycemia management: rule of 15's, Glucagon  Emergency Kit and medical alert ID -low-carb low-fat plate-method diet -At least 20 minutes of physical activity per day -Annual dilated retinal eye exam and foot exam -compliance and follow up needs -follow up as scheduled or earlier if problem gets worse  Call if blood sugar is less than 70 or consistently above 250    Take a 15 gm snack of carbohydrate at bedtime before you go to sleep if your blood sugar is less than 100.    If you are going to fast after midnight for a test or procedure, ask your physician for instructions on how to reduce/decrease your insulin  dose.    Call if blood sugar is less than 70 or consistently above 250  -Treating a low sugar by rule of 15  (15 gms of sugar every 15 min until sugar is more than 70) If you feel your sugar is low, test your sugar to be sure If your sugar is low (less than 70), then take 15 grams of a fast acting Carbohydrate (3-4 glucose tablets or glucose gel or 4 ounces of juice or regular soda) Recheck your sugar 15 min after treating low to make sure it is more than 70 If sugar is still less than 70, treat again with 15 grams of carbohydrate          Don't drive the hour of hypoglycemia  If unconscious/unable to eat or drink by mouth, use glucagon  injection or nasal spray baqsimi and call 911. Can repeat again in  15 min if still unconscious.  Return in about 3 months (around 03/29/2024).   I have reviewed current medications, nurse's notes, allergies, vital signs, past medical and surgical history, family medical history, and social history for this encounter. Counseled patient on symptoms, examination findings, lab findings, imaging results, treatment decisions and monitoring and prognosis. The patient understood the recommendations and agrees with the treatment plan. All questions regarding treatment plan  were fully answered.  Greg Birmingham, MD  12/29/23    History of Present Illness Greg Esparza is a 58 y.o. year old male who presents for follow up of Type II diabetes mellitus.  MARKALE BIRDSELL was first diagnosed around 2005.   Diabetes education +  Home diabetes regimen: Lantus  24 units qam  Humalog  5 units with brunch, and 7 units with supper 15 min before meals   Stopped Jardiance  25 mg every day half a pill/farxiga  5 mg qd (per cardiologist, due to dizziness needing midodrine )  COMPLICATIONS +  MI/Stroke, has a defibrillator, s/p stents   -  retinopathy +  neuropathy +  nephropathy  BLOOD SUGAR DATA CGM interpretation: At today's visit, we reviewed her CGM downloads. The full report is scanned in the media. Reviewing the CGM trends, BG are elevated post meals, and sometimes low overnight.   Physical Exam  BP 122/80   Pulse 87   Ht 5' 8 (1.727 m)   Wt 197 lb (89.4 kg)   SpO2 96%   BMI 29.95 kg/m    Constitutional: well developed, well nourished Head: normocephalic, atraumatic Eyes: sclera anicteric, no redness Neck: supple Lungs: normal respiratory effort Neurology: alert and oriented Skin: dry, no appreciable rashes Musculoskeletal: no appreciable defects Psychiatric: normal mood and affect Diabetic Foot Exam - Simple   No data filed      Current Medications Patient's Medications  New Prescriptions   No medications on file  Previous Medications   ASPIRIN 81 MG CHEWABLE TABLET    Chew 81 mg by mouth.   ATORVASTATIN (LIPITOR) 40 MG TABLET    Take 40 mg by mouth daily.   BLOOD GLUCOSE MONITORING SUPPL DEVI    1 each by Does not apply route in the morning, at noon, and at bedtime. May substitute to any manufacturer covered by patient's insurance.   BUSPIRONE  (BUSPAR ) 10 MG TABLET    TAKE 2 TABLETS BY MOUTH 2 TIMES DAILY.   CLOPIDOGREL (PLAVIX) 75 MG TABLET    Take 75 mg by mouth daily.   CONTINUOUS GLUCOSE RECEIVER (FREESTYLE LIBRE 3 READER) DEVI    1  (ONE) EACH AS DIRECTED   CONTINUOUS GLUCOSE SENSOR (FREESTYLE LIBRE 3 SENSOR) MISC    1 each by Other route every 14 (fourteen) days. With 3 sensors provided per month   ESOMEPRAZOLE (NEXIUM) 40 MG CAPSULE    Take 40 mg by mouth daily as needed (acid reflux).   EVOLOCUMAB  (REPATHA  SURECLICK) 140 MG/ML SOAJ    Inject 140 mg into the skin every 14 (fourteen) days.   EZETIMIBE  (ZETIA ) 10 MG TABLET    TAKE 1 TABLET BY MOUTH EVERY DAY   FLUTICASONE  (FLONASE ) 50 MCG/ACT NASAL SPRAY    Place 2 sprays into both nostrils daily.   GLUCAGON  (GVOKE HYPOPEN  1-PACK) 1 MG/0.2ML SOAJ    Inject 1 Pen into the skin as directed.   INSULIN  LISPRO (HUMALOG  KWIKPEN) 100 UNIT/ML KWIKPEN    Inject 5-10 Units into the skin 3 (three) times daily. 15 min before meals   LANTUS  SOLOSTAR 100  UNIT/ML SOLOSTAR PEN    Inject 30 Units into the skin daily.   LINACLOTIDE (LINZESS) 290 MCG CAPS CAPSULE    Take 290 mcg by mouth daily before breakfast.   LOPERAMIDE  (IMODIUM ) 2 MG CAPSULE    Take 1 capsule (2 mg total) by mouth as needed for diarrhea or loose stools.   MIDODRINE  (PROAMATINE ) 2.5 MG TABLET    Take 1 tablet (2.5 mg total) by mouth 3 (three) times daily with meals.   MUPIROCIN  OINTMENT (BACTROBAN ) 2 %    Apply 1 Application topically daily as needed (wound care).   NALOXONE (NARCAN) NASAL SPRAY 4 MG/0.1 ML       OXYCODONE  (ROXICODONE ) 15 MG IMMEDIATE RELEASE TABLET    Take 15 mg by mouth every 4 (four) hours as needed for pain.    PREGABALIN  (LYRICA ) 75 MG CAPSULE    Take 75 mg by mouth 3 (three) times daily.   RYBELSUS  14 MG TABS    TAKE 1 TABLET (14 MG TOTAL) BY MOUTH DAILY   TRINTELLIX  20 MG TABS TABLET    Take 1 tablet (20 mg total) by mouth daily.   VITAMIN D, ERGOCALCIFEROL, (DRISDOL) 50000 UNITS CAPS CAPSULE    Take 50,000 Units by mouth every Tuesday.  Modified Medications   No medications on file  Discontinued Medications   No medications on file    Allergies Allergies  Allergen Reactions   Lipitor  [Atorvastatin] Other (See Comments)    Muscle pain   Oxymorphone Other (See Comments)    Urinary retention   Gabapentin Other (See Comments)    Unknown reaction   Lisinopril Cough    cough   Morphine Nausea And Vomiting   Penicillin G Rash    Past Medical History Past Medical History:  Diagnosis Date   AICD (automatic cardioverter/defibrillator) present    Anxiety    Arthritis    Cardiac defibrillator in situ    Cardiomyopathy (HCC)    CHF (congestive heart failure) (HCC)    Chronic lower back pain    Chronic pain syndrome    Chronic systolic (congestive) heart failure (HCC)    CKD (chronic kidney disease)    Depression    Diabetes (HCC)    Diabetic neuropathy (HCC)    GERD (gastroesophageal reflux disease)    History of kidney cancer    Hypotension    Myocardial infarction [facility])     Past Surgical History Past Surgical History:  Procedure Laterality Date   BACK SURGERY     CARDIAC DEFIBRILLATOR PLACEMENT     In Situ   CORONARY STENT PLACEMENT  2025   KIDNEY CYST REMOVAL Right    KIDNEY SURGERY  2025   WISDOM TOOTH EXTRACTION      Family History family history includes Cancer in his paternal grandmother; Diabetes in his brother, brother, maternal grandfather, maternal grandmother, mother, and sister; Heart failure in his maternal grandfather, maternal grandmother, and mother.  Social History Social History   Socioeconomic History   Marital status: Widowed    Spouse name: Not on file   Number of children: 1   Years of education: Not on file   Highest education level: 8th grade  Occupational History   Occupation: Disabled    Comment: 2018  Tobacco Use   Smoking status: Former    Types: Cigarettes    Passive exposure: Never   Smokeless tobacco: Never  Vaping Use   Vaping status: Never Used  Substance and Sexual Activity   Alcohol use: Never  Drug use: Never   Sexual activity: Yes    Partners: Female    Birth control/protection: None  Other Topics  Concern   Not on file  Social History Narrative   Not on file   Social Drivers of Health   Financial Resource Strain: Medium Risk (01/30/2023)   Overall Financial Resource Strain (CARDIA)    Difficulty of Paying Living Expenses: Somewhat hard  Food Insecurity: No Food Insecurity (07/24/2023)   Hunger Vital Sign    Worried About Running Out of Food in the Last Year: Never true    Ran Out of Food in the Last Year: Never true  Transportation Needs: No Transportation Needs (07/24/2023)   PRAPARE - Administrator, Civil Service (Medical): No    Lack of Transportation (Non-Medical): No  Physical Activity: Insufficiently Active (01/30/2023)   Exercise Vital Sign    Days of Exercise per Week: 2 days    Minutes of Exercise per Session: 20 min  Stress: No Stress Concern Present (01/30/2023)   [employer] of Occupational Health - Occupational Stress Questionnaire    Feeling of Stress : Only a little  Social Connections: Moderately Integrated (01/30/2023)   Social Connection and Isolation Panel    Frequency of Communication with Friends and Family: More than three times a week    Frequency of Social Gatherings with Friends and Family: Three times a week    Attends Religious Services: More than 4 times per year    Active Member of Clubs or Organizations: Yes    Attends Banker Meetings: More than 4 times per year    Marital Status: Widowed  Intimate Partner Violence: Not At Risk (07/24/2023)   Humiliation, Afraid, Rape, and Kick questionnaire    Fear of Current or Ex-Partner: No    Emotionally Abused: No    Physically Abused: No    Sexually Abused: No    Lab Results  Component Value Date   HGBA1C 7.2 (H) 09/21/2023   HGBA1C 8.8 (H) 06/22/2023   HGBA1C 8.6 (H) 04/02/2023   Lab Results  Component Value Date   CHOL 108 09/21/2023   Lab Results  Component Value Date   HDL 38 (L) 09/21/2023   Lab Results  Component Value Date   LDLCALC 36 09/21/2023    Lab Results  Component Value Date   TRIG 214 (H) 09/21/2023   Lab Results  Component Value Date   CHOLHDL 2.8 09/21/2023   Lab Results  Component Value Date   CREATININE 2.05 (H) 09/21/2023   Lab Results  Component Value Date   GFR 37.24 (L) 06/30/2023   Lab Results  Component Value Date   MICROALBUR 4.7 09/29/2023      Component Value Date/Time   NA 139 09/21/2023 1045   K 4.9 09/21/2023 1045   CL 104 09/21/2023 1045   CO2 20 09/21/2023 1045   GLUCOSE 155 (H) 09/21/2023 1045   GLUCOSE 183 (H) 07/25/2023 0548   BUN 27 (H) 09/21/2023 1045   CREATININE 2.05 (H) 09/21/2023 1045   CALCIUM 9.2 09/21/2023 1045   PROT 7.0 09/21/2023 1045   ALBUMIN 4.1 09/21/2023 1045   AST 22 09/21/2023 1045   ALT 16 09/21/2023 1045   ALKPHOS 62 09/21/2023 1045   BILITOT 0.2 09/21/2023 1045   GFRNONAA 31 (L) 07/25/2023 0548   GFRAA 41 (L) 01/11/2018 1740      Latest Ref Rng & Units 09/21/2023   10:45 AM 08/13/2023    8:15 AM 08/03/2023  2:44 PM  BMP  Glucose 70 - 99 mg/dL 844  850  802   BUN 6 - 24 mg/dL 27  34  23   Creatinine 0.76 - 1.27 mg/dL 7.94  7.63  7.93   BUN/Creat Ratio 9 - 20 13  14  11    Sodium 134 - 144 mmol/L 139  139  138   Potassium 3.5 - 5.2 mmol/L 4.9  5.0  5.4   Chloride 96 - 106 mmol/L 104  102  102   CO2 20 - 29 mmol/L 20  22  21    Calcium 8.7 - 10.2 mg/dL 9.2  9.2  9.4        Component Value Date/Time   WBC 5.4 09/21/2023 1045   WBC 8.3 07/24/2023 0537   RBC 4.73 09/21/2023 1045   RBC 4.49 07/24/2023 0537   HGB 13.1 09/21/2023 1045   HCT 40.3 09/21/2023 1045   PLT 225 09/21/2023 1045   MCV 85 09/21/2023 1045   MCH 27.7 09/21/2023 1045   MCH 28.5 07/24/2023 0537   MCHC 32.5 09/21/2023 1045   MCHC 34.2 07/24/2023 0537   RDW 12.5 09/21/2023 1045   LYMPHSABS 1.7 09/21/2023 1045   MONOABS 0.7 07/24/2023 0009   EOSABS 0.1 09/21/2023 1045   BASOSABS 0.0 09/21/2023 1045     Parts of this note may have been dictated using voice recognition  software. There may be variances in spelling and vocabulary which are unintentional. Not all errors are proofread. Please notify the dino if any discrepancies are noted or if the meaning of any statement is not clear.

## 2023-12-30 ENCOUNTER — Encounter: Payer: Self-pay | Admitting: "Endocrinology

## 2023-12-31 ENCOUNTER — Other Ambulatory Visit: Payer: Self-pay | Admitting: Physician Assistant

## 2023-12-31 ENCOUNTER — Other Ambulatory Visit

## 2023-12-31 DIAGNOSIS — F331 Major depressive disorder, recurrent, moderate: Secondary | ICD-10-CM

## 2023-12-31 DIAGNOSIS — E1165 Type 2 diabetes mellitus with hyperglycemia: Secondary | ICD-10-CM

## 2023-12-31 MED ORDER — FREESTYLE LIBRE 3 SENSOR MISC: 1.0000 | 3 refills | Status: AC

## 2023-12-31 MED ORDER — RYBELSUS 7 MG PO TABS: 7.0000 mg | ORAL_TABLET | Freq: Two times a day (BID) | ORAL | 3 refills | Status: DC

## 2023-12-31 MED ORDER — BUSPIRONE HCL 10 MG PO TABS: 20.0000 mg | ORAL_TABLET | Freq: Two times a day (BID) | ORAL | 2 refills | Status: AC

## 2024-01-05 NOTE — Telephone Encounter (Signed)
 Left message for patient to return call office to discuss appointment.      Multiple attempts to reach the patient by phone, will send a message to the patient via MyChart to make him aware of appointment date and time.

## 2024-01-26 ENCOUNTER — Other Ambulatory Visit: Payer: Self-pay | Admitting: Physician Assistant

## 2024-01-26 ENCOUNTER — Ambulatory Visit: Admitting: Physician Assistant

## 2024-01-26 DIAGNOSIS — E1169 Type 2 diabetes mellitus with other specified complication: Secondary | ICD-10-CM

## 2024-02-11 ENCOUNTER — Other Ambulatory Visit: Payer: Self-pay | Admitting: Physician Assistant

## 2024-02-21 ENCOUNTER — Other Ambulatory Visit: Payer: Self-pay | Admitting: Physician Assistant

## 2024-02-29 ENCOUNTER — Ambulatory Visit: Admitting: Gastroenterology

## 2024-03-10 ENCOUNTER — Other Ambulatory Visit (HOSPITAL_BASED_OUTPATIENT_CLINIC_OR_DEPARTMENT_OTHER): Payer: Self-pay

## 2024-03-10 ENCOUNTER — Ambulatory Visit (HOSPITAL_BASED_OUTPATIENT_CLINIC_OR_DEPARTMENT_OTHER)
Admission: EM | Admit: 2024-03-10 | Discharge: 2024-03-10 | Disposition: A | Discharge status: Home / Self Care | Attending: Family Medicine | Admitting: Family Medicine

## 2024-03-10 ENCOUNTER — Encounter (HOSPITAL_BASED_OUTPATIENT_CLINIC_OR_DEPARTMENT_OTHER): Payer: Self-pay

## 2024-03-10 ENCOUNTER — Telehealth: Payer: Self-pay

## 2024-03-10 DIAGNOSIS — U071 COVID-19: Secondary | ICD-10-CM

## 2024-03-10 DIAGNOSIS — Z20822 Contact with and (suspected) exposure to covid-19: Secondary | ICD-10-CM

## 2024-03-10 DIAGNOSIS — R051 Acute cough: Secondary | ICD-10-CM | POA: Diagnosis not present

## 2024-03-10 DIAGNOSIS — R112 Nausea with vomiting, unspecified: Secondary | ICD-10-CM

## 2024-03-10 LAB — POC COVID19/FLU A&B COMBO
Covid Antigen, POC: POSITIVE — AB
Influenza A Antigen, POC: NEGATIVE
Influenza B Antigen, POC: NEGATIVE

## 2024-03-10 MED ORDER — ONDANSETRON 4 MG PO TBDP
4.0000 mg | ORAL_TABLET | Freq: Three times a day (TID) | ORAL | 0 refills | Status: AC | PRN
  Filled 2024-03-10: qty 20, 7d supply, fill #0

## 2024-03-10 MED ORDER — ONDANSETRON HCL 4 MG/2ML IJ SOLN
4.0000 mg | Freq: Once | INTRAMUSCULAR | Status: AC
  Administered 2024-03-10: 4 mg via INTRAMUSCULAR

## 2024-03-10 MED ORDER — PROMETHAZINE-DM 6.25-15 MG/5ML PO SYRP
5.0000 mL | ORAL_SOLUTION | Freq: Four times a day (QID) | ORAL | 0 refills | Status: AC | PRN
  Filled 2024-03-10: qty 118, 6d supply, fill #0

## 2024-03-10 NOTE — ED Provider Notes (Signed)
 Greg Esparza CARE    CSN: [id number] Arrival date & time: 03/10/24  1040      History   Chief Complaint Chief Complaint  Patient presents with   Fatigue    HPI Greg Esparza is a 58 y.o. male.   58 year old male with some fatigue, sneezing, coughing, chest congestion and some vomiting.  Symptoms started on 03/08/2024.  He has been exposed to COVID.  OTC Mucinex has helped his symptoms a little bit.  His girlfriend was diagnosed with COVID on 03/08/2024.     Past Medical History:  Diagnosis Date   AICD (automatic cardioverter/defibrillator) present    Anxiety    Arthritis    Cardiac defibrillator in situ    Cardiomyopathy (HCC)    CHF (congestive heart failure) (HCC)    Chronic lower back pain    Chronic pain syndrome    Chronic systolic (congestive) heart failure (HCC)    CKD (chronic kidney disease)    Depression    Diabetes (HCC)    Diabetic neuropathy (HCC)    GERD (gastroesophageal reflux disease)    History of kidney cancer    Hypotension    Myocardial infarction [facility])     Patient Active Problem List   Diagnosis Date Noted   Statin myopathy 12/26/2023   Weight gain 12/26/2023   Chronic kidney disease, stage 3b (HCC) 09/21/2023   Seasonal allergies 09/21/2023   Dehydration 08/03/2023   Gastroenteritis 07/24/2023   Metabolic alkalosis 07/24/2023   Respiratory alkalosis 07/24/2023   Hyperkalemia 07/24/2023   Chronic fatigue 06/22/2023   Hypotension 06/22/2023   Right renal mass 05/07/2023   Mixed hyperlipidemia due to type 2 diabetes mellitus (HCC) 05/04/2023   Type 2 diabetes mellitus with diabetic foot ulcer (HCC) 05/04/2023   Viral URI with cough 04/02/2023   Arthropathy 03/12/2023   Polydipsia 03/12/2023   Hypoglycemia associated with diabetes (HCC) 03/12/2023   Biventricular ICD (implantable cardioverter-defibrillator) in place 01/09/2019   Chronic systolic congestive heart failure (HCC) 11/30/2018   Coronary artery disease involving  native heart 05/23/2016   High risk medication use 05/23/2016   Painful diabetic neuropathy (HCC) 08/26/2015   Plantar fasciitis 08/26/2015   Chronic pain syndrome 07/09/2015   Cardiomyopathy (HCC) 07/09/2015   Depression 07/09/2015   Diabetes (HCC) 07/09/2015   Herniated nucleus pulposus, L4-5 07/09/2015   Hypertension 07/09/2015   Low back pain 07/09/2015   DIAB W/OPHTH MANIFESTS TYPE I [JUV TYPE] UNCNTRL 03/22/2007   Anxiety 03/22/2007   Inflammatory and toxic neuropathy 03/22/2007   INGROWING NAIL 03/22/2007    Past Surgical History:  Procedure Laterality Date   BACK SURGERY     CARDIAC DEFIBRILLATOR PLACEMENT     In Situ   CORONARY STENT PLACEMENT  2025   KIDNEY CYST REMOVAL Right    KIDNEY SURGERY  2025   WISDOM TOOTH EXTRACTION         Home Medications    Prior to Admission medications   Medication Sig Start Date End Date Taking? Authorizing Provider  ACCU-CHEK GUIDE TEST test strip TEST IN THE MORNING AND AT [facility] AND AT BEDTIME 02/21/24  Yes Craft, Nola, PA  aspirin 81 MG chewable tablet Chew 81 mg by mouth. 12/09/23 12/08/24 Yes [provider]  atorvastatin (LIPITOR) 40 MG tablet Take 40 mg by mouth daily. 12/14/23 12/13/24 Yes [provider]  Blood Glucose Monitoring Suppl DEVI 1 each by Does not apply route in the morning, at noon, and at bedtime. May substitute to any manufacturer covered by  patient's insurance. 06/15/23  Yes Craft, Nola, PA  busPIRone  (BUSPAR ) 10 MG tablet Take 2 tablets (20 mg total) by mouth 2 (two) times daily. 12/31/23  Yes Craft, Nola, PA  clopidogrel (PLAVIX) 75 MG tablet Take 75 mg by mouth daily. 12/09/23 06/06/24 Yes [provider]  Continuous Glucose Receiver (FREESTYLE LIBRE 3 READER) DEVI 1 (ONE) EACH AS DIRECTED 12/02/22  Yes [provider]  Continuous Glucose Sensor (FREESTYLE LIBRE 3 SENSOR) MISC 1 each by Other route every 14 (fourteen) days. With 3 sensors provided per month 12/31/23  Yes Craft,  [place], [place]  esomeprazole (NEXIUM) 40 MG capsule Take 40 mg by mouth daily as needed (acid reflux). 10/18/17  Yes [provider]  Evolocumab  (REPATHA  SURECLICK) 140 MG/ML SOAJ INJECT 140 MG INTO THE SKIN EVERY 14 (FOURTEEN) DAYS. 01/26/24  Yes Craft, Nola, PA  ezetimibe  (ZETIA ) 10 MG tablet TAKE 1 TABLET BY MOUTH EVERY DAY 12/20/23  Yes Craft, [place], PA  fluticasone  (FLONASE ) 50 MCG/ACT nasal spray Place 2 sprays into both nostrils daily. 09/21/23  Yes Craft, Nola, PA  Glucagon  (GVOKE HYPOPEN  1-PACK) 1 MG/0.2ML SOAJ Inject 1 Pen into the skin as directed. 03/12/23  Yes Craft, Nola, PA  LANTUS  SOLOSTAR 100 UNIT/ML Solostar Pen Inject 30 Units into the skin daily. Patient taking differently: Inject 28 Units into the skin daily. 07/25/23  Yes Samtani, Jai-Gurmukh, MD  linaclotide (LINZESS) 290 MCG CAPS capsule Take 290 mcg by mouth daily before breakfast.   Yes [provider]  loperamide  (IMODIUM ) 2 MG capsule Take 1 capsule (2 mg total) by mouth as needed for diarrhea or loose stools. 07/25/23  Yes Samtani, Jai-Gurmukh, MD  midodrine  (PROAMATINE ) 2.5 MG tablet Take 1 tablet (2.5 mg total) by mouth 3 (three) times daily with meals. 12/23/23  Yes Craft, Nola, PA  mupirocin  ointment (BACTROBAN ) 2 % Apply 1 Application topically daily as needed (wound care). 05/04/23  Yes Craft, Nola, PA  naloxone [facility]) nasal spray 4 mg/0.1 mL  11/03/19  Yes [provider]  ondansetron  (ZOFRAN -ODT) 4 MG disintegrating tablet Take 1 tablet (4 mg total) by mouth every 8 (eight) hours as needed for nausea or vomiting. 03/10/24  Yes Ival Domino, FNP  oxyCODONE  (ROXICODONE ) 15 MG immediate release tablet Take 15 mg by mouth every 4 (four) hours as needed for pain.  12/22/17  Yes [provider]  pregabalin  (LYRICA ) 75 MG capsule Take 75 mg by mouth 3 (three) times daily. 02/08/23  Yes [provider]  promethazine-dextromethorphan (PROMETHAZINE-DM) 6.25-15 MG/5ML syrup Take 5 mLs by  mouth 4 (four) times daily as needed for cough. Do not use and drive - May make drowsy. 03/10/24  Yes Ival Domino, FNP  Semaglutide  (RYBELSUS ) 7 MG TABS Take 1 tablet (7 mg total) by mouth 2 (two) times daily. 12/31/23  Yes Craft, Nola, PA  TRINTELLIX  20 MG TABS tablet Take 1 tablet (20 mg total) by mouth daily. 11/09/23  Yes Craft, Nola, PA  Vitamin D, Ergocalciferol, (DRISDOL) 50000 units CAPS capsule Take 50,000 Units by mouth every Tuesday. 12/05/17  Yes [provider]  insulin  lispro (HUMALOG  KWIKPEN) 100 UNIT/ML KwikPen Inject 5-10 Units into the skin 3 (three) times daily. 15 min before meals Patient taking differently: Inject 4-7 Units into the skin 2 (two) times daily. 15 min before meals.  Pt only been taking insulin  BID. 07/30/23 12/29/23  Dartha Ernst, MD    Family History Family History  Problem Relation Age of Onset   Heart failure Mother  Diabetes Mother    Diabetes Sister    Diabetes Brother    Diabetes Brother    Heart failure Maternal Grandmother    Diabetes Maternal Grandmother    Heart failure Maternal Grandfather    Diabetes Maternal Grandfather    Cancer Paternal Grandmother        unknown    Social History Social History   Tobacco Use   Smoking status: Former    Types: Cigarettes    Passive exposure: Never   Smokeless tobacco: Never  Vaping Use   Vaping status: Never Used  Substance Use Topics   Alcohol use: Never   Drug use: Never     Allergies   Lipitor [atorvastatin], Oxymorphone, Gabapentin, Lisinopril, Morphine, and Penicillin g   Review of Systems Review of Systems  Constitutional:  Negative for chills and fever.  HENT:  Positive for congestion, postnasal drip and rhinorrhea. Negative for ear pain and sore throat.   Eyes:  Negative for pain and visual disturbance.  Respiratory:  Positive for cough and chest tightness.   Cardiovascular:  Negative for chest pain and palpitations.  Gastrointestinal:  Positive for vomiting.  Negative for abdominal pain, constipation, diarrhea and nausea.  Genitourinary:  Negative for dysuria and hematuria.  Musculoskeletal:  Negative for arthralgias and back pain.  Skin:  Negative for color change and rash.  Neurological:  Negative for seizures and syncope.  All other systems reviewed and are negative.    Physical Exam Triage Vital Signs ED Triage Vitals  Encounter Vitals Group     BP 03/10/24 1113 102/63     Girls Systolic BP Percentile --      Girls Diastolic BP Percentile --      Boys Systolic BP Percentile --      Boys Diastolic BP Percentile --      Pulse Rate 03/10/24 1113 (!) 122     Resp 03/10/24 1113 18     Temp 03/10/24 1113 99 F (37.2 C)     Temp Source 03/10/24 1113 Oral     SpO2 03/10/24 1113 99 %     Weight 03/10/24 1112 193 lb (87.5 kg)     Height 03/10/24 1112 5' 8 (1.727 m)     Head Circumference --      Peak Flow --      Pain Score 03/10/24 1112 0     Pain Loc --      Pain Education --      Exclude from Growth Chart --    No data found.  Updated Vital Signs BP 102/63 (BP Location: Right Arm)   Pulse (!) 122   Temp 99 F (37.2 C) (Oral)   Resp 18   Ht 5' 8 (1.727 m)   Wt 193 lb (87.5 kg)   SpO2 99%   BMI 29.35 kg/m   Visual Acuity Right Eye Distance:   Left Eye Distance:   Bilateral Distance:    Right Eye Near:   Left Eye Near:    Bilateral Near:     Physical Exam Vitals and nursing note reviewed.  Constitutional:      General: He is not in acute distress.    Appearance: He is well-developed. He is ill-appearing. He is not toxic-appearing or diaphoretic.  HENT:     Head: Normocephalic and atraumatic.     Right Ear: Hearing, tympanic membrane, ear canal and external ear normal.     Left Ear: Hearing, tympanic membrane, ear canal and external ear normal.  Nose: Congestion and rhinorrhea present. Rhinorrhea is clear.     Right Sinus: No maxillary sinus tenderness or frontal sinus tenderness.     Left Sinus: No  maxillary sinus tenderness or frontal sinus tenderness.     Mouth/Throat:     Lips: Pink.     Mouth: Mucous membranes are moist.     Pharynx: Uvula midline. No oropharyngeal exudate or posterior oropharyngeal erythema.     Tonsils: No tonsillar exudate.  Eyes:     Conjunctiva/sclera: Conjunctivae normal.     Pupils: Pupils are equal, round, and reactive to light.  Cardiovascular:     Rate and Rhythm: Normal rate and regular rhythm.     Heart sounds: S1 normal and S2 normal. No murmur heard. Pulmonary:     Effort: Pulmonary effort is normal. No respiratory distress.     Breath sounds: Normal breath sounds. No decreased breath sounds, wheezing, rhonchi or rales.  Abdominal:     General: Bowel sounds are normal.     Palpations: Abdomen is soft.     Tenderness: There is abdominal tenderness in the right upper quadrant and epigastric area. There is no right CVA tenderness, left CVA tenderness, guarding or rebound. Negative signs include Murphy's sign, Rovsing's sign and McBurney's sign.     Comments: Patient is actively vomiting during the exam.  Musculoskeletal:        General: No swelling.     Cervical back: Neck supple.  Lymphadenopathy:     Head:     Right side of head: No submental, submandibular, tonsillar, preauricular or posterior auricular adenopathy.     Left side of head: No submental, submandibular, tonsillar, preauricular or posterior auricular adenopathy.     Cervical: No cervical adenopathy.     Right cervical: No superficial cervical adenopathy.    Left cervical: No superficial cervical adenopathy.  Skin:    General: Skin is warm and dry.     Capillary Refill: Capillary refill takes less than 2 seconds.     Findings: No rash.  Neurological:     Mental Status: He is alert and oriented to person, place, and time.  Psychiatric:        Mood and Affect: Mood normal.      UC Treatments / Results  Labs (all labs ordered are listed, but only abnormal results are  displayed) Labs Reviewed  POC COVID19/FLU A&B COMBO - Abnormal; Notable for the following components:      Result Value   Covid Antigen, POC Positive (*)    All other components within normal limits    EKG   Radiology No results found.  Procedures Procedures (including critical care time)  Medications Ordered in UC Medications  ondansetron  (ZOFRAN ) injection 4 mg (4 mg Intramuscular Given 03/10/24 1143)    Initial Impression / Assessment and Plan / UC Course  I have reviewed the triage vital signs and the nursing notes.  Pertinent labs & imaging results that were available during my care of the patient were reviewed by me and considered in my medical decision making (see chart for details).  Plan of Care: COVID-19 viral infection with cough and nausea and vomiting: Given ondansetron  4 mg injection during the visit and it helped his active vomiting.  Use ondansetron , 4 mg, melts on tongue every 8 hours as needed at home.  Provided information about nausea and vomiting and clear liquid diet.  Promethazine DM, 5 mL, every 6 hours if needed for cough or congestion.  Patient is sick  but not looking toxic.  He only has 1 kidney.  We discussed possible use of Paxlovid but he would prefer not to do that due to his status with a single kidney.  Reviewed signs and symptoms of worsening condition and reasons to go to an emergency room (see discharge instruction).  I reviewed the plan of care with the patient and/or the patient's guardian.  The patient and/or guardian had time to ask questions and acknowledged that the questions were answered.  I provided instruction on symptoms or reasons to return here or to go to an ER, if symptoms/condition did not improve, worsened or if new symptoms occurred.  Final Clinical Impressions(s) / UC Diagnoses   Final diagnoses:  Acute cough  Nausea and vomiting, unspecified vomiting type  Exposure to COVID-19 virus  COVID-19 virus infection      Discharge Instructions      COVID-19 viral infection with cough and nausea and vomiting: Given ondansetron  4 mg injection during the visit and it helped his active vomiting.  Use ondansetron , 4 mg, melts on tongue every 8 hours as needed at home.  Provided information about nausea and vomiting and clear liquid diet.  Promethazine DM, 5 mL, every 6 hours if needed for cough or congestion.  Patient is sick but not looking toxic.  He only has 1 kidney.  We discussed possible use of Paxlovid but he would prefer not to do that due to his status with a single kidney.  Reviewed signs and symptoms of worsening condition and reasons to go to an emergency room (see below).  Get help right away if: You have trouble breathing or get short of breath. You have pain or pressure in your chest. You're feeling confused. These symptoms may be an emergency. Get help right away. Call 911. Do not wait to see if the symptoms wi     ED Prescriptions     Medication Sig Dispense Auth. Provider   promethazine-dextromethorphan (PROMETHAZINE-DM) 6.25-15 MG/5ML syrup Take 5 mLs by mouth 4 (four) times daily as needed for cough. Do not use and drive - May make drowsy. 118 mL Ival Domino, FNP   ondansetron  (ZOFRAN -ODT) 4 MG disintegrating tablet Take 1 tablet (4 mg total) by mouth every 8 (eight) hours as needed for nausea or vomiting. 20 tablet Faruq Rosenberger, FNP      PDMP not reviewed this encounter.   Ival Domino, FNP 03/10/24 1200

## 2024-03-10 NOTE — ED Triage Notes (Signed)
 Pt states that he has some fatigue, sneezing, coughing, vomiting and chest congestion. X2 days  Pt states that he was exposed to covid. Pt states that he has been taking Mucinex otc.

## 2024-03-10 NOTE — Telephone Encounter (Signed)
 Called patient and recommended go to a urgent care or he can go to pharmacy and get covid test and do a urgent care visit through my chart. Patient girlfriend got on the phone and I walked her through the step of a virtual visit, but she stated that she will take him to urgent care on [address].  Copied from CRM #[id number]. Topic: Appointments - Appointment Scheduling >> Mar 10, 2024  9:24 AM Greg Esparza wrote: Patient/patient representative is calling to schedule an appointment. Refer to attachments for appointment information.  Greg Esparza girlfriend test positive for COVID this past Wednesday. Greg Esparza and his son wants to see if they can come in today and have someone come out to the car to test them. Both feeling tired and feel like them have it. Please call [name] at [phone] >> Mar 10, 2024  9:29 AM Greg Esparza wrote: There is no appointment available for today.

## 2024-03-10 NOTE — Discharge Instructions (Addendum)
 COVID-19 viral infection with cough and nausea and vomiting: Given ondansetron  4 mg injection during the visit and it helped his active vomiting.  Use ondansetron , 4 mg, melts on tongue every 8 hours as needed at home.  Provided information about nausea and vomiting and clear liquid diet.  Promethazine DM, 5 mL, every 6 hours if needed for cough or congestion.  Patient is sick but not looking toxic.  He only has 1 kidney.  We discussed possible use of Paxlovid but he would prefer not to do that due to his status with a single kidney.  Reviewed signs and symptoms of worsening condition and reasons to go to an emergency room (see below).  Get help right away if: You have trouble breathing or get short of breath. You have pain or pressure in your chest. You're feeling confused. These symptoms may be an emergency. Get help right away. Call 911. Do not wait to see if the symptoms wi

## 2024-03-20 ENCOUNTER — Ambulatory Visit (HOSPITAL_BASED_OUTPATIENT_CLINIC_OR_DEPARTMENT_OTHER)
Admission: EM | Admit: 2024-03-20 | Discharge: 2024-03-20 | Disposition: A | Discharge status: Home / Self Care | Attending: Family Medicine | Admitting: Family Medicine

## 2024-03-20 ENCOUNTER — Other Ambulatory Visit (HOSPITAL_BASED_OUTPATIENT_CLINIC_OR_DEPARTMENT_OTHER): Payer: Self-pay

## 2024-03-20 ENCOUNTER — Encounter (HOSPITAL_BASED_OUTPATIENT_CLINIC_OR_DEPARTMENT_OTHER): Payer: Self-pay | Admitting: Emergency Medicine

## 2024-03-20 DIAGNOSIS — J302 Other seasonal allergic rhinitis: Secondary | ICD-10-CM | POA: Diagnosis not present

## 2024-03-20 DIAGNOSIS — J3489 Other specified disorders of nose and nasal sinuses: Secondary | ICD-10-CM | POA: Diagnosis not present

## 2024-03-20 DIAGNOSIS — J0100 Acute maxillary sinusitis, unspecified: Secondary | ICD-10-CM

## 2024-03-20 MED ORDER — FLUTICASONE PROPIONATE 50 MCG/ACT NA SUSP
1.0000 | Freq: Two times a day (BID) | NASAL | 0 refills | Status: AC | PRN
  Filled 2024-03-20: qty 16, 30d supply, fill #0

## 2024-03-20 MED ORDER — DOXYCYCLINE HYCLATE 100 MG PO CAPS
100.0000 mg | ORAL_CAPSULE | Freq: Two times a day (BID) | ORAL | 0 refills | Status: AC
  Filled 2024-03-20: qty 20, 10d supply, fill #0

## 2024-03-20 NOTE — Discharge Instructions (Addendum)
 Acute left maxillary sinusitis with sinus pressure and pain: Patient has diabetes so no injectable nor oral steroids will be used.  Fluticasone  nasal spray, 1 spray into each nostril twice daily for nasal congestion.  Strongly encouraged sinus rinses twice daily to help get the sinuses empty.  Doxycycline  100 mg twice daily for 10 days.  Get plenty of fluids and rest.  Follow-up if symptoms do not improve, worsen or new symptoms occur.  See below for signs and symptoms of worsening condition and reasons to go to an emergency room.  Contact a health care provider if: You have a fever. Your symptoms get worse. Your symptoms do not improve within 10 days.  Get help right away if: You have a severe headache. You have persistent vomiting. You have severe pain or swelling around your face or eyes. You have vision problems. You develop confusion. Your neck is stiff. You have trouble breathing.  These symptoms may be an emergency. Get help right away. Call 911. Do not wait to see if the symptoms will go away. Do not drive yourself to the hospital.

## 2024-03-20 NOTE — ED Triage Notes (Signed)
 Pt c/o nasal congestion and chest congestion, he reports green mucous. Pt was seen here on 11/14 and was positive for Covid.

## 2024-03-20 NOTE — ED Provider Notes (Signed)
 [facility]    CSN: [id number] Arrival date & time: 03/20/24  9171      History   Chief Complaint Chief Complaint  Patient presents with   Nasal Congestion   Cough   chest congestion    HPI KEYSEAN Esparza is a 58 y.o. male.   58 year old male who was seen on 03/10/24 with some fatigue, sneezing, coughing, chest congestion and some vomiting.  Symptoms started on 03/08/2024.  He has been exposed to COVID.  OTC Mucinex has helped his symptoms a little bit.  His girlfriend was diagnosed with COVID on 03/08/2024.  He was positive for COVID-19 viral infection.  He only has one kidney.  He was obviously sick but did not look critically ill and did not feel terrible so we decided not to use Paxlovid.  We use Promethazine  DM and either ondansetron  or promethazine  for his nausea and vomiting.  Encouraged supportive measures.  He is here today with persisting nasal congestion, chest congestion and coughing up green mucus.  He is concerned he might have developed bronchitis or pneumonia.     Past Medical History:  Diagnosis Date   AICD (automatic cardioverter/defibrillator) present    Anxiety    Arthritis    Cardiac defibrillator in situ    Cardiomyopathy (HCC)    CHF (congestive heart failure) (HCC)    Chronic lower back pain    Chronic pain syndrome    Chronic systolic (congestive) heart failure (HCC)    CKD (chronic kidney disease)    Depression    Diabetes (HCC)    Diabetic neuropathy (HCC)    GERD (gastroesophageal reflux disease)    History of kidney cancer    Hypotension    Myocardial infarction [facility])     Patient Active Problem List   Diagnosis Date Noted   Statin myopathy 12/26/2023   Weight gain 12/26/2023   Chronic kidney disease, stage 3b (HCC) 09/21/2023   Seasonal allergies 09/21/2023   Dehydration 08/03/2023   Gastroenteritis 07/24/2023   Metabolic alkalosis 07/24/2023   Respiratory alkalosis 07/24/2023   Hyperkalemia 07/24/2023   Chronic  fatigue 06/22/2023   Hypotension 06/22/2023   Right renal mass 05/07/2023   Mixed hyperlipidemia due to type 2 diabetes mellitus (HCC) 05/04/2023   Type 2 diabetes mellitus with diabetic foot ulcer (HCC) 05/04/2023   Viral URI with cough 04/02/2023   Arthropathy 03/12/2023   Polydipsia 03/12/2023   Hypoglycemia associated with diabetes (HCC) 03/12/2023   Biventricular ICD (implantable cardioverter-defibrillator) in place 01/09/2019   Chronic systolic congestive heart failure (HCC) 11/30/2018   Coronary artery disease involving native heart 05/23/2016   High risk medication use 05/23/2016   Painful diabetic neuropathy (HCC) 08/26/2015   Plantar fasciitis 08/26/2015   Chronic pain syndrome 07/09/2015   Cardiomyopathy (HCC) 07/09/2015   Depression 07/09/2015   Diabetes (HCC) 07/09/2015   Herniated nucleus pulposus, L4-5 07/09/2015   Hypertension 07/09/2015   Low back pain 07/09/2015   DIAB W/OPHTH MANIFESTS TYPE I [JUV TYPE] UNCNTRL 03/22/2007   Anxiety 03/22/2007   Inflammatory and toxic neuropathy 03/22/2007   INGROWING NAIL 03/22/2007    Past Surgical History:  Procedure Laterality Date   BACK SURGERY     CARDIAC DEFIBRILLATOR PLACEMENT     In Situ   CORONARY STENT PLACEMENT  2025   KIDNEY CYST REMOVAL Right    KIDNEY SURGERY  2025   WISDOM TOOTH EXTRACTION         Home Medications    Prior to Admission medications  Medication Sig Start Date End Date Taking? Authorizing Provider  atorvastatin (LIPITOR) 40 MG tablet Take 40 mg by mouth daily. 12/14/23 12/13/24 Yes [provider]  clopidogrel (PLAVIX) 75 MG tablet Take 75 mg by mouth daily. 12/09/23 06/06/24 Yes [provider]  Continuous Glucose Sensor (FREESTYLE LIBRE 3 SENSOR) MISC 1 each by Other route every 14 (fourteen) days. With 3 sensors provided per month 12/31/23  Yes Craft, [place], [place]  doxycycline  (VIBRAMYCIN ) 100 MG capsule Take 1 capsule (100 mg total) by mouth 2 (two) times daily for 10  days. 03/20/24 03/30/24 Yes Ival Domino, FNP  Evolocumab  (REPATHA  SURECLICK) 140 MG/ML SOAJ INJECT 140 MG INTO THE SKIN EVERY 14 (FOURTEEN) DAYS. 01/26/24  Yes Craft, Nola, PA  ezetimibe  (ZETIA ) 10 MG tablet TAKE 1 TABLET BY MOUTH EVERY DAY 12/20/23  Yes Craft, Nola, PA  fluticasone  (FLONASE ) 50 MCG/ACT nasal spray Place 1 spray into both nostrils 2 (two) times daily as needed for rhinitis. 03/20/24 04/19/24 Yes Ival Domino, FNP  TRINTELLIX  20 MG TABS tablet Take 1 tablet (20 mg total) by mouth daily. 11/09/23  Yes Craft, Nola, PA  ACCU-CHEK GUIDE TEST test strip TEST IN THE MORNING AND AT [facility] AND AT BEDTIME 02/21/24   Milon Nola, PA  aspirin 81 MG chewable tablet Chew 81 mg by mouth. 12/09/23 12/08/24  [provider]  Blood Glucose Monitoring Suppl DEVI 1 each by Does not apply route in the morning, at noon, and at bedtime. May substitute to any manufacturer covered by patient's insurance. 06/15/23   Milon Nola, PA  busPIRone  (BUSPAR ) 10 MG tablet Take 2 tablets (20 mg total) by mouth 2 (two) times daily. 12/31/23   Milon Nola, PA  Continuous Glucose Receiver (FREESTYLE LIBRE 3 READER) DEVI 1 (ONE) EACH AS DIRECTED 12/02/22   [provider]  esomeprazole (NEXIUM) 40 MG capsule Take 40 mg by mouth daily as needed (acid reflux). 10/18/17   [provider]  Glucagon  (GVOKE HYPOPEN  1-PACK) 1 MG/0.2ML SOAJ Inject 1 Pen into the skin as directed. 03/12/23   Milon Nola, PA  insulin  lispro (HUMALOG  KWIKPEN) 100 UNIT/ML KwikPen Inject 5-10 Units into the skin 3 (three) times daily. 15 min before meals Patient taking differently: Inject 4-7 Units into the skin 2 (two) times daily. 15 min before meals.  Pt only been taking insulin  BID. 07/30/23 12/29/23  Motwani, Komal, MD  LANTUS  SOLOSTAR 100 UNIT/ML Solostar Pen Inject 30 Units into the skin daily. Patient taking differently: Inject 28 Units into the skin daily. 07/25/23   Samtani, Jai-Gurmukh, MD  linaclotide (LINZESS) 290 MCG  CAPS capsule Take 290 mcg by mouth daily before breakfast.    [provider]  loperamide  (IMODIUM ) 2 MG capsule Take 1 capsule (2 mg total) by mouth as needed for diarrhea or loose stools. 07/25/23   Samtani, Jai-Gurmukh, MD  midodrine  (PROAMATINE ) 2.5 MG tablet Take 1 tablet (2.5 mg total) by mouth 3 (three) times daily with meals. 12/23/23   Milon Nola, PA  mupirocin  ointment (BACTROBAN ) 2 % Apply 1 Application topically daily as needed (wound care). 05/04/23   Milon Nola, PA  naloxone [facility]) nasal spray 4 mg/0.1 mL  11/03/19   [provider]  ondansetron  (ZOFRAN -ODT) 4 MG disintegrating tablet Take 1 tablet (4 mg total) by mouth every 8 (eight) hours as needed for nausea or vomiting. 03/10/24   Ival Domino, FNP  oxyCODONE  (ROXICODONE ) 15 MG immediate release tablet Take 15 mg by mouth every 4 (four) hours as needed for  pain.  12/22/17   [provider]  pregabalin  (LYRICA ) 75 MG capsule Take 75 mg by mouth 3 (three) times daily. 02/08/23   [provider]  promethazine -dextromethorphan (PROMETHAZINE -DM) 6.25-15 MG/5ML syrup Take 5 mLs by mouth 4 (four) times daily as needed for cough. Do not use and drive - May make drowsy. 03/10/24   Ival Domino, FNP  Semaglutide  (RYBELSUS ) 7 MG TABS Take 1 tablet (7 mg total) by mouth 2 (two) times daily. 12/31/23   Milon Cleaves, PA  Vitamin D, Ergocalciferol, (DRISDOL) 50000 units CAPS capsule Take 50,000 Units by mouth every Tuesday. 12/05/17   [provider]    Family History Family History  Problem Relation Age of Onset   Heart failure Mother    Diabetes Mother    Diabetes Sister    Diabetes Brother    Diabetes Brother    Heart failure Maternal Grandmother    Diabetes Maternal Grandmother    Heart failure Maternal Grandfather    Diabetes Maternal Grandfather    Cancer Paternal Grandmother        unknown    Social History Social History   Tobacco Use   Smoking status: Former    Types:  Cigarettes    Passive exposure: Never   Smokeless tobacco: Never  Vaping Use   Vaping status: Never Used  Substance Use Topics   Alcohol use: Never   Drug use: Never     Allergies   Lipitor [atorvastatin], Oxymorphone, Gabapentin, Lisinopril, Morphine, and Penicillin g   Review of Systems Review of Systems  Constitutional:  Negative for chills and fever.  HENT:  Positive for congestion, postnasal drip and rhinorrhea. Negative for ear pain and sore throat.   Eyes:  Negative for pain and visual disturbance.  Respiratory:  Positive for cough and chest tightness.   Cardiovascular:  Negative for chest pain and palpitations.  Gastrointestinal:  Negative for abdominal pain, constipation, diarrhea, nausea and vomiting.  Genitourinary:  Negative for dysuria and hematuria.  Musculoskeletal:  Negative for arthralgias and back pain.  Skin:  Negative for color change and rash.  Neurological:  Negative for seizures and syncope.  All other systems reviewed and are negative.    Physical Exam Triage Vital Signs ED Triage Vitals [03/20/24 0942]  Encounter Vitals Group     BP      Girls Systolic BP Percentile      Girls Diastolic BP Percentile      Boys Systolic BP Percentile      Boys Diastolic BP Percentile      Pulse      Resp      Temp      Temp src      SpO2      Weight      Height      Head Circumference      Peak Flow      Pain Score 0     Pain Loc      Pain Education      Exclude from Growth Chart    No data found.  Updated Vital Signs BP 111/67 (BP Location: Right Arm)   Pulse 74   Temp 97.9 F (36.6 C) (Oral)   Resp 18   SpO2 98%   Visual Acuity Right Eye Distance:   Left Eye Distance:   Bilateral Distance:    Right Eye Near:   Left Eye Near:    Bilateral Near:     Physical Exam Vitals and nursing note reviewed.  Constitutional:      General: He is not in acute distress.    Appearance: He is well-developed. He is ill-appearing. He is not  toxic-appearing or diaphoretic.  HENT:     Head: Normocephalic and atraumatic.     Right Ear: Hearing, tympanic membrane, ear canal and external ear normal.     Left Ear: Hearing, tympanic membrane, ear canal and external ear normal.     Nose: Mucosal edema, congestion and rhinorrhea present. Rhinorrhea is purulent.     Right Sinus: No maxillary sinus tenderness or frontal sinus tenderness.     Left Sinus: Maxillary sinus tenderness (Moderate pain) present. No frontal sinus tenderness.     Mouth/Throat:     Lips: Pink.     Mouth: Mucous membranes are moist.     Pharynx: Uvula midline. No oropharyngeal exudate or posterior oropharyngeal erythema.     Tonsils: No tonsillar exudate.  Eyes:     Conjunctiva/sclera: Conjunctivae normal.     Pupils: Pupils are equal, round, and reactive to light.  Cardiovascular:     Rate and Rhythm: Normal rate and regular rhythm.     Heart sounds: S1 normal and S2 normal. No murmur heard. Pulmonary:     Effort: Pulmonary effort is normal. No respiratory distress.     Breath sounds: Normal breath sounds. No decreased breath sounds, wheezing, rhonchi or rales.  Abdominal:     General: Bowel sounds are normal.     Palpations: Abdomen is soft.     Tenderness: There is no abdominal tenderness.  Musculoskeletal:        General: No swelling.     Cervical back: Neck supple.  Lymphadenopathy:     Head:     Right side of head: No submental, submandibular, tonsillar, preauricular or posterior auricular adenopathy.     Left side of head: Tonsillar adenopathy present. No submental, submandibular, preauricular or posterior auricular adenopathy.     Cervical: Cervical adenopathy present.     Right cervical: No superficial cervical adenopathy.    Left cervical: Superficial cervical adenopathy present.  Skin:    General: Skin is warm and dry.     Capillary Refill: Capillary refill takes less than 2 seconds.     Findings: No rash.  Neurological:     Mental Status:  He is alert and oriented to person, place, and time.  Psychiatric:        Mood and Affect: Mood normal.      UC Treatments / Results  Labs (all labs ordered are listed, but only abnormal results are displayed) Labs Reviewed - No data to display  EKG   Radiology No results found.  Procedures Procedures (including critical care time)  Medications Ordered in UC Medications - No data to display  Initial Impression / Assessment and Plan / UC Course  I have reviewed the triage vital signs and the nursing notes.  Pertinent labs & imaging results that were available during my care of the patient were reviewed by me and considered in my medical decision making (see chart for details).  Plan of Care: Acute left maxillary sinusitis with sinus pressure and pain: Patient has diabetes so no injectable nor oral steroids will be used.  Fluticasone  nasal spray, 1 spray into each nostril twice daily for nasal congestion.  Strongly encouraged sinus rinses twice daily to help get the sinuses empty.  Doxycycline  100 mg twice daily for 10 days.  Get plenty of fluids and rest.  Follow-up if symptoms do not  improve, worsen or new symptoms occur.  See discharge instructions for signs and symptoms of worsening condition and reasons to go to an emergency room.  I reviewed the plan of care with the patient and/or the patient's guardian.  The patient and/or guardian had time to ask questions and acknowledged that the questions were answered.  I provided instruction on symptoms or reasons to return here or to go to an ER, if symptoms/condition did not improve, worsened or if new symptoms occurred.  Final Clinical Impressions(s) / UC Diagnoses   Final diagnoses:  Acute non-recurrent maxillary sinusitis  Pain of maxillary sinus     Discharge Instructions      Acute left maxillary sinusitis with sinus pressure and pain: Patient has diabetes so no injectable nor oral steroids will be used.  Fluticasone   nasal spray, 1 spray into each nostril twice daily for nasal congestion.  Strongly encouraged sinus rinses twice daily to help get the sinuses empty.  Doxycycline  100 mg twice daily for 10 days.  Get plenty of fluids and rest.  Follow-up if symptoms do not improve, worsen or new symptoms occur.  See below for signs and symptoms of worsening condition and reasons to go to an emergency room.  Contact a health care provider if: You have a fever. Your symptoms get worse. Your symptoms do not improve within 10 days.  Get help right away if: You have a severe headache. You have persistent vomiting. You have severe pain or swelling around your face or eyes. You have vision problems. You develop confusion. Your neck is stiff. You have trouble breathing.  These symptoms may be an emergency. Get help right away. Call 911. Do not wait to see if the symptoms will go away. Do not drive yourself to the hospital.     ED Prescriptions     Medication Sig Dispense Auth. Provider   doxycycline  (VIBRAMYCIN ) 100 MG capsule Take 1 capsule (100 mg total) by mouth 2 (two) times daily for 10 days. 20 capsule Ival Domino, FNP   fluticasone  (FLONASE ) 50 MCG/ACT nasal spray Place 1 spray into both nostrils 2 (two) times daily as needed for rhinitis. 17 mL Ival Domino, FNP      PDMP not reviewed this encounter.   Ival Domino, FNP 03/20/24 [phone]

## 2024-04-04 ENCOUNTER — Ambulatory Visit: Admitting: Physician Assistant

## 2024-04-12 ENCOUNTER — Telehealth: Payer: Self-pay

## 2024-04-12 NOTE — Telephone Encounter (Signed)
 Prior auth for Repatha  has been approved.

## 2024-04-18 ENCOUNTER — Encounter: Payer: Self-pay | Admitting: Physician Assistant

## 2024-04-18 ENCOUNTER — Ambulatory Visit: Admitting: Physician Assistant

## 2024-04-18 VITALS — BP 108/72 | HR 86 | Temp 97.8°F | Ht 68.0 in | Wt 195.8 lb

## 2024-04-18 DIAGNOSIS — T753XXA Motion sickness, initial encounter: Secondary | ICD-10-CM | POA: Diagnosis not present

## 2024-04-18 DIAGNOSIS — T466X5A Adverse effect of antihyperlipidemic and antiarteriosclerotic drugs, initial encounter: Secondary | ICD-10-CM

## 2024-04-18 DIAGNOSIS — I2510 Atherosclerotic heart disease of native coronary artery without angina pectoris: Secondary | ICD-10-CM

## 2024-04-18 DIAGNOSIS — E1169 Type 2 diabetes mellitus with other specified complication: Secondary | ICD-10-CM | POA: Diagnosis not present

## 2024-04-18 DIAGNOSIS — E1165 Type 2 diabetes mellitus with hyperglycemia: Secondary | ICD-10-CM

## 2024-04-18 DIAGNOSIS — I950 Idiopathic hypotension: Secondary | ICD-10-CM

## 2024-04-18 DIAGNOSIS — E782 Mixed hyperlipidemia: Secondary | ICD-10-CM | POA: Diagnosis not present

## 2024-04-18 DIAGNOSIS — G720 Drug-induced myopathy: Secondary | ICD-10-CM | POA: Diagnosis not present

## 2024-04-18 DIAGNOSIS — Z794 Long term (current) use of insulin: Secondary | ICD-10-CM

## 2024-04-18 DIAGNOSIS — J0101 Acute recurrent maxillary sinusitis: Secondary | ICD-10-CM

## 2024-04-18 DIAGNOSIS — N1832 Chronic kidney disease, stage 3b: Secondary | ICD-10-CM

## 2024-04-18 DIAGNOSIS — I5022 Chronic systolic (congestive) heart failure: Secondary | ICD-10-CM | POA: Diagnosis not present

## 2024-04-18 LAB — CBC WITH DIFFERENTIAL/PLATELET
Basophils Absolute: 0 x10E3/uL (ref 0.0–0.2)
Basos: 0 %
EOS (ABSOLUTE): 0.1 x10E3/uL (ref 0.0–0.4)
Eos: 1 %
Hematocrit: 33.3 % — ABNORMAL LOW (ref 37.5–51.0)
Hemoglobin: 11 g/dL — ABNORMAL LOW (ref 13.0–17.7)
Immature Grans (Abs): 0 x10E3/uL (ref 0.0–0.1)
Immature Granulocytes: 0 %
Lymphocytes Absolute: 1.3 x10E3/uL (ref 0.7–3.1)
Lymphs: 26 %
MCH: 27.9 pg (ref 26.6–33.0)
MCHC: 33 g/dL (ref 31.5–35.7)
MCV: 85 fL (ref 79–97)
Monocytes Absolute: 0.4 x10E3/uL (ref 0.1–0.9)
Monocytes: 9 %
Neutrophils Absolute: 3.1 x10E3/uL (ref 1.4–7.0)
Neutrophils: 64 %
Platelets: 233 x10E3/uL (ref 150–450)
RBC: 3.94 x10E6/uL — ABNORMAL LOW (ref 4.14–5.80)
RDW: 13.1 % (ref 11.6–15.4)
WBC: 4.9 x10E3/uL (ref 3.4–10.8)

## 2024-04-18 LAB — COMPREHENSIVE METABOLIC PANEL WITH GFR
ALT: 16 IU/L (ref 0–44)
AST: 22 IU/L (ref 0–40)
Albumin: 4.3 g/dL (ref 3.8–4.9)
Alkaline Phosphatase: 68 IU/L (ref 47–123)
BUN/Creatinine Ratio: 12 (ref 9–20)
BUN: 26 mg/dL — ABNORMAL HIGH (ref 6–24)
Bilirubin Total: 0.3 mg/dL (ref 0.0–1.2)
CO2: 18 mmol/L — ABNORMAL LOW (ref 20–29)
Calcium: 8.9 mg/dL (ref 8.7–10.2)
Chloride: 105 mmol/L (ref 96–106)
Creatinine, Ser: 2.14 mg/dL — ABNORMAL HIGH (ref 0.76–1.27)
Globulin, Total: 2.9 g/dL (ref 1.5–4.5)
Glucose: 122 mg/dL — ABNORMAL HIGH (ref 70–99)
Potassium: 4.6 mmol/L (ref 3.5–5.2)
Sodium: 140 mmol/L (ref 134–144)
Total Protein: 7.2 g/dL (ref 6.0–8.5)
eGFR: 35 mL/min/1.73 — ABNORMAL LOW

## 2024-04-18 LAB — LIPID PANEL
Chol/HDL Ratio: 2.9 ratio (ref 0.0–5.0)
Cholesterol, Total: 113 mg/dL (ref 100–199)
HDL: 39 mg/dL — ABNORMAL LOW
LDL Chol Calc (NIH): 48 mg/dL (ref 0–99)
Triglycerides: 150 mg/dL — ABNORMAL HIGH (ref 0–149)
VLDL Cholesterol Cal: 26 mg/dL (ref 5–40)

## 2024-04-18 LAB — HEMOGLOBIN A1C
Est. average glucose Bld gHb Est-mCnc: 160 mg/dL
Hgb A1c MFr Bld: 7.2 % — ABNORMAL HIGH (ref 4.8–5.6)

## 2024-04-18 MED ORDER — AZITHROMYCIN 250 MG PO TABS: ORAL_TABLET | ORAL | 0 refills | Status: AC

## 2024-04-18 MED ORDER — SCOPOLAMINE 1 MG/3DAYS TD PT72: 1.0000 | MEDICATED_PATCH | TRANSDERMAL | 2 refills | Status: AC

## 2024-04-18 NOTE — Assessment & Plan Note (Addendum)
 Cardiology follow-up indicates stable condition. Defibrillator battery at 75%. - Continue current management and monitoring. - Continue to follow up with cardiology

## 2024-04-18 NOTE — Assessment & Plan Note (Addendum)
 Chronic kidney disease, stage 3b Kidney function to be monitored with upcoming blood tests. - Ordered kidney function tests. - Continue to monitor symptoms  Orders:   Comprehensive metabolic panel with GFR

## 2024-04-18 NOTE — Progress Notes (Signed)
 "  Subjective:  Patient ID: Greg Esparza, male    DOB: 16-May-1965  Age: 58 y.o. MRN: [id number]  Chief Complaint  Patient presents with   Medical Management of Chronic Issues    HPI: Discussed the use of AI scribe software for clinical note transcription with the patient, who gave verbal consent to proceed.       Discussed the use of AI scribe software for clinical note transcription with the patient, who gave verbal consent to proceed.  History of Present Illness Greg Esparza is a 58 year old male with diabetes who presents for a chronic follow-up visit.  He has been experiencing symptoms consistent with a head cold for the past couple of days, including sinus congestion described as a 'green mess' and a sensation of 'talking in a barrel or a drum.' He also has a sinus headache, ear pressure, and a 'full green, flaming nose.' No fever is present, and the congestion is consistent throughout the day.  His diabetes management includes an average blood sugar level of 172 mg/dL and an J8r of 2.5%. He is scheduled to see his endocrinologist on March 22nd and is currently monitoring his blood pressure at home.  Regarding dental health, he recently visited a dentist who recommended extraction of his top teeth and getting dentures due to their poor condition. He is concerned about the cost, especially since his insurance may not cover a partial denture for his bottom teeth.  He has a history of cardiac issues and recently had a check-up where his defibrillator's battery was noted to be at 75%. He reports no recent episodes and has stopped taking digoxin  without any issues. He uses a monitor beside his bed to track his heart condition.  He is planning a five-day cruise and discussed options for managing motion sickness.         04/18/2024    9:15 AM 12/23/2023    9:43 AM 08/03/2023    1:51 PM 05/04/2023   10:24 AM 02/03/2023    3:16 PM  Depression screen PHQ 2/9  Decreased Interest 2  2 3 1 1   Down, Depressed, Hopeless 0 0 0 0 2  PHQ - 2 Score 2 2 3 1 3   Altered sleeping 2 2 2  0 0  Tired, decreased energy 2 2 3 2 3   Change in appetite 0 0 2 0 3  Feeling bad or failure about yourself  0 0 1 0 0  Trouble concentrating 2 2 0 2 1  Moving slowly or fidgety/restless 0 0 0 1 0  Suicidal thoughts 0 0 0 0 0  PHQ-9 Score 8 8  11  6  10    Difficult doing work/chores Not difficult at all Somewhat difficult Not difficult at all Somewhat difficult Extremely dIfficult     Data saved with a previous flowsheet row definition        04/18/2024    9:16 AM  Fall Risk   Falls in the past year? 0  Number falls in past yr: 0  Injury with Fall? 0  Risk for fall due to : No Fall Risks  Follow up Falls evaluation completed    Patient Care Team: Milon Cleaves, [place] as PCP - General (Physician Assistant) Scot Richardson SAUNDERS., MD as Referring Physician (Neurology) Meldon Lash, MD as Referring Physician (Cardiology) Prescilla Nancyann RIGGERS as Physician Assistant (Pain Medicine) Malvin Marsa FALCON, DPM as Consulting Physician (Podiatry) Devere, Lonni Righter, MD as Consulting Physician (Urology) Tolbert Katz, [place] (Cardiology)  Alvaro Ricardo KATHEE Mickey., MD as Consulting Physician (Urology)   Review of Systems  Constitutional:  Negative for appetite change, fatigue and fever.  HENT:  Negative for congestion, ear pain, sinus pressure and sore throat.   Respiratory:  Negative for cough, chest tightness, shortness of breath and wheezing.   Cardiovascular:  Negative for chest pain and palpitations.  Gastrointestinal:  Negative for abdominal pain, constipation, diarrhea, nausea and vomiting.  Genitourinary:  Negative for dysuria and hematuria.  Musculoskeletal:  Negative for arthralgias, back pain, joint swelling and myalgias.  Skin:  Negative for rash.  Neurological:  Negative for dizziness, weakness and headaches.  Psychiatric/Behavioral:  Negative for dysphoric mood. The patient is not  nervous/anxious.     Medications Ordered Prior to Encounter[1] Past Medical History:  Diagnosis Date   AICD (automatic cardioverter/defibrillator) present    Anxiety    Arthritis    Cardiac defibrillator in situ    Cardiomyopathy (HCC)    CHF (congestive heart failure) (HCC)    Chronic lower back pain    Chronic pain syndrome    Chronic systolic (congestive) heart failure (HCC)    CKD (chronic kidney disease)    Depression    Diabetes (HCC)    Diabetic neuropathy (HCC)    GERD (gastroesophageal reflux disease)    History of kidney cancer    Hypotension    Myocardial infarction [facility])    Past Surgical History:  Procedure Laterality Date   BACK SURGERY     CARDIAC DEFIBRILLATOR PLACEMENT     In Situ   CORONARY STENT PLACEMENT  2025   KIDNEY CYST REMOVAL Right    KIDNEY SURGERY  2025   WISDOM TOOTH EXTRACTION      Family History  Problem Relation Age of Onset   Heart failure Mother    Diabetes Mother    Diabetes Sister    Diabetes Brother    Diabetes Brother    Heart failure Maternal Grandmother    Diabetes Maternal Grandmother    Heart failure Maternal Grandfather    Diabetes Maternal Grandfather    Cancer Paternal Grandmother        unknown   Social History   Socioeconomic History   Marital status: Widowed    Spouse name: Not on file   Number of children: 1   Years of education: Not on file   Highest education level: 8th grade  Occupational History   Occupation: Disabled    Comment: 2018  Tobacco Use   Smoking status: Former    Types: Cigarettes    Passive exposure: Never   Smokeless tobacco: Never  Vaping Use   Vaping status: Never Used  Substance and Sexual Activity   Alcohol use: Never   Drug use: Never   Sexual activity: Yes    Partners: Female    Birth control/protection: None  Other Topics Concern   Not on file  Social History Narrative   Not on file   Social Drivers of Health   Tobacco Use: Medium Risk (04/18/2024)   Patient History     Smoking Tobacco Use: Former    Smokeless Tobacco Use: Never    Passive Exposure: Never  Physicist, Medical Strain: Medium Risk (01/30/2023)   Overall Financial Resource Strain (CARDIA)    Difficulty of Paying Living Expenses: Somewhat hard  Food Insecurity: Low Risk (04/12/2024)   Received from [facility]   Epic    Within the past 12 months, you worried that your food would run out before you got  money to buy more: Never true    Within the past 12 months, the food you bought just didn't last and you didn't have money to get more. : Never true  Transportation Needs: No Transportation Needs (07/24/2023)   PRAPARE - Administrator, Civil Service (Medical): No    Lack of Transportation (Non-Medical): No  Physical Activity: Insufficiently Active (01/30/2023)   Exercise Vital Sign    Days of Exercise per Week: 2 days    Minutes of Exercise per Session: 20 min  Stress: No Stress Concern Present (01/30/2023)   [employer] of Occupational Health - Occupational Stress Questionnaire    Feeling of Stress : Only a little  Social Connections: Moderately Integrated (01/30/2023)   Social Connection and Isolation Panel    Frequency of Communication with Friends and Family: More than three times a week    Frequency of Social Gatherings with Friends and Family: Three times a week    Attends Religious Services: More than 4 times per year    Active Member of Clubs or Organizations: Yes    Attends Banker Meetings: More than 4 times per year    Marital Status: Widowed  Depression (PHQ2-9): Medium Risk (04/18/2024)   Depression (PHQ2-9)    PHQ-2 Score: 8  Alcohol Screen: Low Risk (02/03/2023)   Alcohol Screen    Last Alcohol Screening Score (AUDIT): 0  Housing: Low Risk (04/12/2024)   Received from [facility]   Epic    What is your living situation today?: I have a steady place to live    Think about the place you live. Do you have problems with any of the  following? Choose all that apply:: Not on file  Utilities: Not At Risk (07/24/2023)   [employer] Utilities    Threatened with loss of utilities: No  Health Literacy: Adequate Health Literacy (02/03/2023)   B1300 Health Literacy    Frequency of need for help with medical instructions: Never    Objective:  BP 108/72 (BP Location: Left Arm, Patient Position: Sitting)   Pulse 86   Temp 97.8 F (36.6 C) (Temporal)   Ht 5' 8 (1.727 m)   Wt 195 lb 12.8 oz (88.8 kg)   SpO2 99%   BMI 29.77 kg/m      04/18/2024    9:16 AM 03/20/2024    9:43 AM 03/10/2024   11:13 AM  BP/Weight  Systolic BP 108 111 102  Diastolic BP 72 67 63  Wt. (Lbs) 195.8    BMI 29.77 kg/m2      Physical Exam Vitals reviewed.  Constitutional:      Appearance: Normal appearance.  Cardiovascular:     Rate and Rhythm: Normal rate and regular rhythm.     Heart sounds: Normal heart sounds.  Pulmonary:     Effort: Pulmonary effort is normal.     Breath sounds: Normal breath sounds.  Abdominal:     General: Bowel sounds are normal.     Palpations: Abdomen is soft.     Tenderness: There is no abdominal tenderness.  Neurological:     Mental Status: He is alert and oriented to person, place, and time.  Psychiatric:        Mood and Affect: Mood normal.        Behavior: Behavior normal.         Lab Results  Component Value Date   WBC 5.4 09/21/2023   HGB 13.1 09/21/2023   HCT 40.3 09/21/2023  PLT 225 09/21/2023   GLUCOSE 155 (H) 09/21/2023   CHOL 108 09/21/2023   TRIG 214 (H) 09/21/2023   HDL 38 (L) 09/21/2023   LDLCALC 36 09/21/2023   ALT 16 09/21/2023   AST 22 09/21/2023   NA 139 09/21/2023   K 4.9 09/21/2023   CL 104 09/21/2023   CREATININE 2.05 (H) 09/21/2023   BUN 27 (H) 09/21/2023   CO2 20 09/21/2023   TSH 1.190 02/03/2023   HGBA1C 7.2 (H) 09/21/2023    Results for orders placed or performed during the hospital encounter of 03/10/24  POC Covid19/Flu A&B Antigen   Collection Time: 03/10/24  11:46 AM  Result Value Ref Range   Influenza A Antigen, POC Negative Negative   Influenza B Antigen, POC Negative Negative   Covid Antigen, POC Positive (A) Negative  .  Assessment & Plan:   Assessment & Plan Type 2 diabetes mellitus with hyperglycemia, with long-term current use of insulin  (HCC) Type 2 diabetes mellitus Blood sugar levels well-managed with average 172 mg/dL and J8r 2.5%. - Ordered A1c, kidney, liver function, blood count, and cholesterol tests. - Scheduled follow-up with endocrinologist on January 5th. Lab Results  Component Value Date   HGBA1C 7.2 (H) 09/21/2023   HGBA1C 8.8 (H) 06/22/2023   HGBA1C 8.6 (H) 04/02/2023    Orders:   Hemoglobin A1c  Chronic systolic congestive heart failure [facility]) Cardiology follow-up indicates stable condition. Defibrillator battery at 75%. - Continue current management and monitoring. - Continue to follow up with cardiology    Chronic kidney disease, stage 3b (HCC) Chronic kidney disease, stage 3b Kidney function to be monitored with upcoming blood tests. - Ordered kidney function tests. - Continue to monitor symptoms  Orders:   Comprehensive metabolic panel with GFR  Mixed hyperlipidemia due to type 2 diabetes mellitus (HCC) Controlled Continue to monitor diet and exercise Labs drawn today Will adjust treatment based on results Lab Results  Component Value Date   LDLCALC 36 09/21/2023    Orders:   Lipid panel   CBC with Differential/Platelet   Idiopathic hypotension Continue to follow up with cardiology Will let us  know if hypotension returns or continues    Coronary artery disease involving native heart, unspecified vessel or lesion type, unspecified whether angina present Continue taking Zetia  10mg  Continue to monitor symptoms - Labs drawn today Will adjust treatment based on results     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    Motion sickness,  initial encounter Motion sickness Upcoming cruise. - Prescribed scopolamine  patches, 24-pack, to be applied behind the ear one hour before boarding and up to three times a day as needed. - Admits to a history of motion sickness Orders:   scopolamine  (TRANSDERM-SCOP) 1 MG/3DAYS; Place 1 patch (1 mg total) onto the skin every 3 (three) days.  Acute recurrent maxillary sinusitis Acute recurrent maxillary sinusitis Symptoms include green nasal discharge, sinus headache, and ear pressure. No fever. Fluid in both ears, left more affected. Throat slightly red. - Prescribed medication to prevent progression to sinus infection, avoiding Keflex due to kidney concerns. Orders:   azithromycin  (ZITHROMAX ) 250 MG tablet; Take 2 tablets on day 1, then 1 tablet daily on days 2 through 5    Body mass index is 29.77 kg/m.    Meds ordered this encounter  Medications   scopolamine  (TRANSDERM-SCOP) 1 MG/3DAYS    Sig: Place 1 patch (1 mg total) onto the skin every 3 (three) days.    Dispense:  24 patch    Refill:  2   azithromycin  (ZITHROMAX ) 250 MG tablet    Sig: Take 2 tablets on day 1, then 1 tablet daily on days 2 through 5    Dispense:  6 tablet    Refill:  0    Orders Placed This Encounter  Procedures   Hemoglobin A1c   Lipid panel   Comprehensive metabolic panel with GFR   CBC with Differential/Platelet       Follow-up: Return in about 4 months (around 08/17/2024) for Chronic, Nola.  An After Visit Summary was printed and given to the patient.   I,Lauren M Auman,acting as a neurosurgeon for [employer], [employer].,have documented all relevant documentation on the behalf of Nola Angles, PA,as directed by  Nola Angles, PA while in the presence of Nola Angles, [place].    Nola Angles, [place] [facility] [phone]     [1]  Current Outpatient Medications on File Prior to Visit  Medication Sig Dispense Refill   ACCU-CHEK GUIDE TEST test strip TEST IN THE MORNING AND AT NOON AND AT  BEDTIME 100 strip 0   aspirin 81 MG chewable tablet Chew 81 mg by mouth.     atorvastatin (LIPITOR) 40 MG tablet Take 40 mg by mouth daily.     Blood Glucose Monitoring Suppl DEVI 1 each by Does not apply route in the morning, at noon, and at bedtime. May substitute to any manufacturer covered by patient's insurance. 1 each 0   busPIRone  (BUSPAR ) 10 MG tablet Take 2 tablets (20 mg total) by mouth 2 (two) times daily. 360 tablet 2   clopidogrel (PLAVIX) 75 MG tablet Take 75 mg by mouth daily.     Continuous Glucose Receiver (FREESTYLE LIBRE 3 READER) DEVI 1 (ONE) EACH AS DIRECTED     Continuous Glucose Sensor (FREESTYLE LIBRE 3 SENSOR) MISC 1 each by Other route every 14 (fourteen) days. With 3 sensors provided per month 6 each 3   esomeprazole (NEXIUM) 40 MG capsule Take 40 mg by mouth daily as needed (acid reflux).  0   Evolocumab  (REPATHA  SURECLICK) 140 MG/ML SOAJ INJECT 140 MG INTO THE SKIN EVERY 14 (FOURTEEN) DAYS. 2 mL 4   ezetimibe  (ZETIA ) 10 MG tablet TAKE 1 TABLET BY MOUTH EVERY DAY 90 tablet 1   fluticasone  (FLONASE ) 50 MCG/ACT nasal spray Place 1 spray into both nostrils 2 (two) times daily as needed for rhinitis. 16 g 0   Glucagon  (GVOKE HYPOPEN  1-PACK) 1 MG/0.2ML SOAJ Inject 1 Pen into the skin as directed. 0.2 mL 3   insulin  lispro (HUMALOG  KWIKPEN) 100 UNIT/ML KwikPen Inject 5-10 Units into the skin 3 (three) times daily. 15 min before meals (Patient taking differently: Inject 4-7 Units into the skin 2 (two) times daily. 15 min before meals.  Pt only been taking insulin  BID.) 15 mL 3   LANTUS  SOLOSTAR 100 UNIT/ML Solostar Pen Inject 30 Units into the skin daily. (Patient taking differently: Inject 28 Units into the skin daily.) 15 mL 6   linaclotide (LINZESS) 290 MCG CAPS capsule Take 290 mcg by mouth daily before breakfast.     loperamide  (IMODIUM ) 2 MG capsule Take 1 capsule (2 mg total) by mouth as needed for diarrhea or loose stools. 30 capsule 0   midodrine  (PROAMATINE ) 2.5 MG  tablet Take 1 tablet (2.5 mg total) by mouth 3 (three) times daily with meals. 270 tablet 2   mupirocin  ointment (BACTROBAN ) 2 % Apply 1 Application topically daily as needed (  wound care). 30 g 1   naloxone (NARCAN) nasal spray 4 mg/0.1 mL      ondansetron  (ZOFRAN -ODT) 4 MG disintegrating tablet Take 1 tablet (4 mg total) by mouth every 8 (eight) hours as needed for nausea or vomiting. 20 tablet 0   oxyCODONE  (ROXICODONE ) 15 MG immediate release tablet Take 15 mg by mouth every 4 (four) hours as needed for pain.   0   pregabalin  (LYRICA ) 75 MG capsule Take 75 mg by mouth 3 (three) times daily.     promethazine -dextromethorphan (PROMETHAZINE -DM) 6.25-15 MG/5ML syrup Take 5 mLs by mouth 4 (four) times daily as needed for cough. Do not use and drive - May make drowsy. 118 mL 0   Semaglutide  (RYBELSUS ) 7 MG TABS Take 1 tablet (7 mg total) by mouth 2 (two) times daily. 60 tablet 3   TRINTELLIX  20 MG TABS tablet Take 1 tablet (20 mg total) by mouth daily. 30 tablet 2   Vitamin D, Ergocalciferol, (DRISDOL) 50000 units CAPS capsule Take 50,000 Units by mouth every Tuesday.  0   No current facility-administered medications on file prior to visit.   "

## 2024-04-18 NOTE — Assessment & Plan Note (Addendum)
 Continue to follow up with cardiology Will let us  know if hypotension returns or continues

## 2024-04-18 NOTE — Assessment & Plan Note (Addendum)
 Continue taking Zetia  10mg  Continue to monitor symptoms - Labs drawn today Will adjust treatment based on results

## 2024-04-18 NOTE — Assessment & Plan Note (Addendum)
 Discontinued statin due to myopathy Improved after discontinuation.

## 2024-04-18 NOTE — Assessment & Plan Note (Addendum)
 Type 2 diabetes mellitus Blood sugar levels well-managed with average 172 mg/dL and J8r 2.5%. - Ordered A1c, kidney, liver function, blood count, and cholesterol tests. - Scheduled follow-up with endocrinologist on January 5th. Lab Results  Component Value Date   HGBA1C 7.2 (H) 09/21/2023   HGBA1C 8.8 (H) 06/22/2023   HGBA1C 8.6 (H) 04/02/2023    Orders:   Hemoglobin A1c

## 2024-04-18 NOTE — Assessment & Plan Note (Addendum)
 Controlled Continue to monitor diet and exercise Labs drawn today Will adjust treatment based on results Lab Results  Component Value Date   LDLCALC 36 09/21/2023    Orders:   Lipid panel   CBC with Differential/Platelet

## 2024-04-24 ENCOUNTER — Ambulatory Visit: Payer: Self-pay | Admitting: Physician Assistant

## 2024-05-01 ENCOUNTER — Ambulatory Visit: Admitting: "Endocrinology

## 2024-05-01 ENCOUNTER — Encounter: Payer: Self-pay | Admitting: "Endocrinology

## 2024-05-01 VITALS — BP 102/80 | HR 80 | Ht 68.0 in | Wt 195.0 lb

## 2024-05-01 DIAGNOSIS — Z7984 Long term (current) use of oral hypoglycemic drugs: Secondary | ICD-10-CM | POA: Diagnosis not present

## 2024-05-01 DIAGNOSIS — E1165 Type 2 diabetes mellitus with hyperglycemia: Secondary | ICD-10-CM | POA: Diagnosis not present

## 2024-05-01 DIAGNOSIS — Z794 Long term (current) use of insulin: Secondary | ICD-10-CM

## 2024-05-01 DIAGNOSIS — E782 Mixed hyperlipidemia: Secondary | ICD-10-CM

## 2024-05-01 NOTE — Patient Instructions (Signed)
 Will recommend the following: Lantus  20 units qam  Humalog  6 units with brunch, and 8 units with supper 15 min before meals Humalog  correction scale: Use in addition to your meal time/short acting insulin  based on blood sugars as follows:  151 - 190: 1 unit 191 - 230: 2 units 231 - 270: 3 units 271 - 310: 4 units 311 - 350: 5 units 351 - 390: 6 units 391 - 430: 7 units    Cut down short acting insulin  to half if eating only half a meal or if blood sugar is between 71-100 before a meal Skip short acting insulin  if blood sugar is less than 70 and treat with 15 gms of carbohydrates every 15 min until blood sugar is more than 100   _____________    Goals of DM therapy:  Morning Fasting blood sugar: 80-140  Blood sugar before meals: 80-140 Bed time blood sugar: 100-150  A1C <7%, limited only by hypoglycemia  1.Diabetes medications and their side effects discussed, including hypoglycemia    2. Check blood glucose:  a) Always check blood sugars before driving. Please see below (under hypoglycemia) on how to manage b) Check a minimum of 3 times/day or more as needed when having symptoms of hypoglycemia.   c) Try to check blood glucose before sleeping/in the middle of the night to ensure that it is remaining stable and not dropping less than 100 d) Check blood glucose more often if sick  3. Diet: a) 3 meals per day schedule b: Restrict carbs to 60-70 grams (4 servings) per meal c) Colorful vegetables - 3 servings a day, and low sugar fruit 2 servings/day Plate control method: 1/4 plate protein, 1/4 starch, 1/2 green, yellow, or red vegetables d) Avoid carbohydrate snacks unless hypoglycemic episode, or increased physical activity  4. Regular exercise as tolerated, preferably 3 or more hours a week  5. Hypoglycemia: a)  Do not drive or operate machinery without first testing blood glucose to assure it is over 90 mg%, or if dizzy, lightheaded, not feeling normal, etc, or  if foot  or leg is numb or weak. b)  If blood glucose less than 70, take four 5gm Glucose tabs or 15-30 gm Glucose gel.  Repeat every 15 min as needed until blood sugar is >100 mg/dl. If hypoglycemia persists then call 911.   6. Sick day management: a) Check blood glucose more often b) Continue usual therapy if blood sugars are elevated.   7. Contact the doctor immediately if blood glucose is frequently <60 mg/dl, or an episode of severe hypoglycemia occurs (where someone had to give you glucose/  glucagon  or if you passed out from a low blood glucose), or if blood glucose is persistently >350 mg/dl, for further management  8. A change in level of physical activity or exercise and a change in diet may also affect your blood sugar. Check blood sugars more often and call if needed.  Instructions: 1. Bring glucose meter, blood glucose records on every visit for review 2. Continue to follow up with primary care physician and other providers for medical care 3. Yearly eye  and foot exam 4. Please get blood work done prior to the next appointment

## 2024-05-01 NOTE — Progress Notes (Signed)
 "   Outpatient Endocrinology Note Greg Birmingham, MD  05/01/2024   Greg Esparza 57-25-1967 [id number]  Referring Provider: Milon Cleaves, PA Primary Care Provider: Milon Cleaves, PA Reason for consultation: Subjective   Assessment & Plan  Diagnoses and all orders for this visit:  Uncontrolled type 2 diabetes mellitus with hyperglycemia (HCC)  Long-term insulin  use (HCC)  Long term (current) use of oral hypoglycemic drugs  Mixed hypercholesterolemia and hypertriglyceridemia     Diabetes Type II complicated by neuropathy, nephropathy, defibrillator Lab Results  Component Value Date   GFR 37.24 (L) 06/30/2023   Hba1c goal less than 7, current Hba1c is  Lab Results  Component Value Date   HGBA1C 7.2 (H) 04/18/2024   Will recommend the following: Lantus  20 units qam  Humalog  6 units with brunch, and 8 units with supper 15 min before meals Humalog  correction scale: Use in addition to your meal time/short acting insulin  based on blood sugars as follows:  151 - 190: 1 unit 191 - 230: 2 units 231 - 270: 3 units 271 - 310: 4 units 311 - 350: 5 units 351 - 390: 6 units 391 - 430: 7 units   Cut down short acting insulin  to half if eating only half a meal or if blood sugar is between 71-100 before a meal Skip short acting insulin  if blood sugar is less than 70 and treat with 15 gms of carbohydrates every 15 min until blood sugar is more than 100   Stopped Jardiance  25 mg every day half a pill/farxiga  5 mg qd (per cardiologist, due to dizziness needing midodrine ) No known contraindications/side effects to any of above medications Has Glucagon  - discussed use on 07/30/23    -Last LD and Tg are as follows: Lab Results  Component Value Date   LDLCALC 48 04/18/2024    Lab Results  Component Value Date   TRIG 150 (H) 04/18/2024   -On repatha  and zetia , not on statin due to aches  -Follow low fat diet and exercise   -Blood pressure goal <140/90 -  Microalbumin/creatinine goal is < 30 -Last MA/Cr is as follows: Lab Results  Component Value Date   MICROALBUR 4.7 09/29/2023   -not on ACE/ARB -diet changes including salt restriction -limit eating outside -counseled BP targets per standards of diabetes care -uncontrolled blood pressure can lead to retinopathy, nephropathy and cardiovascular and atherosclerotic heart disease  Reviewed and counseled on: -A1C target -Blood sugar targets -Complications of uncontrolled diabetes  -Checking blood sugar before meals and bedtime and bring log next visit -All medications with mechanism of action and side effects -Hypoglycemia management: rule of 15's, Glucagon  Emergency Kit and medical alert ID -low-carb low-fat plate-method diet -At least 20 minutes of physical activity per day -Annual dilated retinal eye exam and foot exam -compliance and follow up needs -follow up as scheduled or earlier if problem gets worse  Call if blood sugar is less than 70 or consistently above 250    Take a 15 gm snack of carbohydrate at bedtime before you go to sleep if your blood sugar is less than 100.    If you are going to fast after midnight for a test or procedure, ask your physician for instructions on how to reduce/decrease your insulin  dose.    Call if blood sugar is less than 70 or consistently above 250  -Treating a low sugar by rule of 15  (15 gms of sugar every 15 min until sugar is more than 70) If you  feel your sugar is low, test your sugar to be sure If your sugar is low (less than 70), then take 15 grams of a fast acting Carbohydrate (3-4 glucose tablets or glucose gel or 4 ounces of juice or regular soda) Recheck your sugar 15 min after treating low to make sure it is more than 70 If sugar is still less than 70, treat again with 15 grams of carbohydrate          Don't drive the hour of hypoglycemia  If unconscious/unable to eat or drink by mouth, use glucagon  injection or nasal spray  baqsimi and call 911. Can repeat again in 15 min if still unconscious.  No follow-ups on file.   I have reviewed current medications, nurse's notes, allergies, vital signs, past medical and surgical history, family medical history, and social history for this encounter. Counseled patient on symptoms, examination findings, lab findings, imaging results, treatment decisions and monitoring and prognosis. The patient understood the recommendations and agrees with the treatment plan. All questions regarding treatment plan were fully answered.  Greg Birmingham, MD  05/01/2024    History of Present Illness Greg Esparza is a 59 y.o. year old male who presents for follow up of Type II diabetes mellitus.  Greg Esparza was first diagnosed around 2005.   Diabetes education +  Home diabetes regimen: Lantus  22 units qam  Humalog  5 units with brunch, and 7 units with supper 15 min before meals   Stopped Jardiance  25 mg every day half a pill/farxiga  5 mg qd (per cardiologist, due to dizziness needing midodrine )  COMPLICATIONS +  MI/Stroke, has a defibrillator, s/p stents   -  retinopathy +  neuropathy +  nephropathy  BLOOD SUGAR DATA CGM interpretation: At today's visit, we reviewed her CGM downloads. The full report is scanned in the media. Reviewing the CGM trends, BG are elevated around 8pm-2am; and rare random lows.  Physical Exam  BP 102/80   Pulse 80   Ht 5' 8 (1.727 m)   Wt 195 lb (88.5 kg)   SpO2 99%   BMI 29.65 kg/m    Constitutional: well developed, well nourished Head: normocephalic, atraumatic Eyes: sclera anicteric, no redness Neck: supple Lungs: normal respiratory effort Neurology: alert and oriented Skin: dry, no appreciable rashes Musculoskeletal: no appreciable defects Psychiatric: normal mood and affect Diabetic Foot Exam - Simple   No data filed      Current Medications Patient's Medications  New Prescriptions   No medications on file  Previous  Medications   ACCU-CHEK GUIDE TEST TEST STRIP    TEST IN THE MORNING AND AT NOON AND AT BEDTIME   ASPIRIN 81 MG CHEWABLE TABLET    Chew 81 mg by mouth.   ATORVASTATIN (LIPITOR) 40 MG TABLET    Take 40 mg by mouth daily.   BLOOD GLUCOSE MONITORING SUPPL DEVI    1 each by Does not apply route in the morning, at noon, and at bedtime. May substitute to any manufacturer covered by patient's insurance.   BUSPIRONE  (BUSPAR ) 10 MG TABLET    Take 2 tablets (20 mg total) by mouth 2 (two) times daily.   CLOPIDOGREL (PLAVIX) 75 MG TABLET    Take 75 mg by mouth daily.   CONTINUOUS GLUCOSE RECEIVER (FREESTYLE LIBRE 3 READER) DEVI    1 (ONE) EACH AS DIRECTED   CONTINUOUS GLUCOSE SENSOR (FREESTYLE LIBRE 3 SENSOR) MISC    1 each by Other route every 14 (fourteen) days. With 3 sensors  provided per month   ESOMEPRAZOLE (NEXIUM) 40 MG CAPSULE    Take 40 mg by mouth daily as needed (acid reflux).   EVOLOCUMAB  (REPATHA  SURECLICK) 140 MG/ML SOAJ    INJECT 140 MG INTO THE SKIN EVERY 14 (FOURTEEN) DAYS.   EZETIMIBE  (ZETIA ) 10 MG TABLET    TAKE 1 TABLET BY MOUTH EVERY DAY   FLUTICASONE  (FLONASE ) 50 MCG/ACT NASAL SPRAY    Place 1 spray into both nostrils 2 (two) times daily as needed for rhinitis.   GLUCAGON  (GVOKE HYPOPEN  1-PACK) 1 MG/0.2ML SOAJ    Inject 1 Pen into the skin as directed.   INSULIN  LISPRO (HUMALOG  KWIKPEN) 100 UNIT/ML KWIKPEN    Inject 5-10 Units into the skin 3 (three) times daily. 15 min before meals   LANTUS  SOLOSTAR 100 UNIT/ML SOLOSTAR PEN    Inject 30 Units into the skin daily.   LINACLOTIDE (LINZESS) 290 MCG CAPS CAPSULE    Take 290 mcg by mouth daily before breakfast.   LOPERAMIDE  (IMODIUM ) 2 MG CAPSULE    Take 1 capsule (2 mg total) by mouth as needed for diarrhea or loose stools.   MIDODRINE  (PROAMATINE ) 2.5 MG TABLET    Take 1 tablet (2.5 mg total) by mouth 3 (three) times daily with meals.   MUPIROCIN  OINTMENT (BACTROBAN ) 2 %    Apply 1 Application topically daily as needed (wound care).    NALOXONE (NARCAN) NASAL SPRAY 4 MG/0.1 ML       ONDANSETRON  (ZOFRAN -ODT) 4 MG DISINTEGRATING TABLET    Take 1 tablet (4 mg total) by mouth every 8 (eight) hours as needed for nausea or vomiting.   OXYCODONE  (ROXICODONE ) 15 MG IMMEDIATE RELEASE TABLET    Take 15 mg by mouth every 4 (four) hours as needed for pain.    PREGABALIN  (LYRICA ) 75 MG CAPSULE    Take 75 mg by mouth 3 (three) times daily.   PROMETHAZINE -DEXTROMETHORPHAN (PROMETHAZINE -DM) 6.25-15 MG/5ML SYRUP    Take 5 mLs by mouth 4 (four) times daily as needed for cough. Do not use and drive - May make drowsy.   SCOPOLAMINE  (TRANSDERM-SCOP) 1 MG/3DAYS    Place 1 patch (1 mg total) onto the skin every 3 (three) days.   SEMAGLUTIDE  (RYBELSUS ) 7 MG TABS    Take 1 tablet (7 mg total) by mouth 2 (two) times daily.   TRINTELLIX  20 MG TABS TABLET    Take 1 tablet (20 mg total) by mouth daily.   VITAMIN D, ERGOCALCIFEROL, (DRISDOL) 50000 UNITS CAPS CAPSULE    Take 50,000 Units by mouth every Tuesday.  Modified Medications   No medications on file  Discontinued Medications   No medications on file    Allergies Allergies  Allergen Reactions   Lipitor [Atorvastatin] Other (See Comments)    Muscle pain   Oxymorphone Other (See Comments)    Urinary retention   Gabapentin Other (See Comments)    Unknown reaction   Lisinopril Cough    cough   Morphine Nausea And Vomiting   Penicillin G Rash    Past Medical History Past Medical History:  Diagnosis Date   AICD (automatic cardioverter/defibrillator) present    Anxiety    Arthritis    Cardiac defibrillator in situ    Cardiomyopathy (HCC)    CHF (congestive heart failure) (HCC)    Chronic lower back pain    Chronic pain syndrome    Chronic systolic (congestive) heart failure (HCC)    CKD (chronic kidney disease)    Depression  Diabetes (HCC)    Diabetic neuropathy (HCC)    GERD (gastroesophageal reflux disease)    History of kidney cancer    Hypotension    Myocardial infarction  [facility])     Past Surgical History Past Surgical History:  Procedure Laterality Date   BACK SURGERY     CARDIAC DEFIBRILLATOR PLACEMENT     In Situ   CORONARY STENT PLACEMENT  2025   KIDNEY CYST REMOVAL Right    KIDNEY SURGERY  2025   WISDOM TOOTH EXTRACTION      Family History family history includes Cancer in his paternal grandmother; Diabetes in his brother, brother, maternal grandfather, maternal grandmother, mother, and sister; Heart failure in his maternal grandfather, maternal grandmother, and mother.  Social History Social History   Socioeconomic History   Marital status: Widowed    Spouse name: Not on file   Number of children: 1   Years of education: Not on file   Highest education level: 8th grade  Occupational History   Occupation: Disabled    Comment: 2018  Tobacco Use   Smoking status: Former    Types: Cigarettes    Passive exposure: Never   Smokeless tobacco: Never  Vaping Use   Vaping status: Never Used  Substance and Sexual Activity   Alcohol use: Never   Drug use: Never   Sexual activity: Yes    Partners: Female    Birth control/protection: None  Other Topics Concern   Not on file  Social History Narrative   Not on file   Social Drivers of Health   Tobacco Use: Medium Risk (05/01/2024)   Patient History    Smoking Tobacco Use: Former    Smokeless Tobacco Use: Never    Passive Exposure: Never  Physicist, Medical Strain: Medium Risk (01/30/2023)   Overall Financial Resource Strain (CARDIA)    Difficulty of Paying Living Expenses: Somewhat hard  Food Insecurity: Low Risk (04/12/2024)   Received from [facility]   Epic    Within the past 12 months, you worried that your food would run out before you got money to buy more: Never true    Within the past 12 months, the food you bought just didn't last and you didn't have money to get more. : Never true  Transportation Needs: No Transportation Needs (07/24/2023)   PRAPARE - Doctor, General Practice (Medical): No    Lack of Transportation (Non-Medical): No  Physical Activity: Insufficiently Active (01/30/2023)   Exercise Vital Sign    Days of Exercise per Week: 2 days    Minutes of Exercise per Session: 20 min  Stress: No Stress Concern Present (01/30/2023)   [employer] of Occupational Health - Occupational Stress Questionnaire    Feeling of Stress : Only a little  Social Connections: Moderately Integrated (01/30/2023)   Social Connection and Isolation Panel    Frequency of Communication with Friends and Family: More than three times a week    Frequency of Social Gatherings with Friends and Family: Three times a week    Attends Religious Services: More than 4 times per year    Active Member of Clubs or Organizations: Yes    Attends Banker Meetings: More than 4 times per year    Marital Status: Widowed  Intimate Partner Violence: Not At Risk (07/24/2023)   Humiliation, Afraid, Rape, and Kick questionnaire    Fear of Current or Ex-Partner: No    Emotionally Abused: No    Physically Abused:  No    Sexually Abused: No  Depression (PHQ2-9): Medium Risk (04/18/2024)   Depression (PHQ2-9)    PHQ-2 Score: 8  Alcohol Screen: Low Risk (02/03/2023)   Alcohol Screen    Last Alcohol Screening Score (AUDIT): 0  Housing: Low Risk (04/12/2024)   Received from [facility]   Epic    What is your living situation today?: I have a steady place to live    Think about the place you live. Do you have problems with any of the following? Choose all that apply:: Not on file  Utilities: Not At Risk (07/24/2023)   [employer] Utilities    Threatened with loss of utilities: No  Health Literacy: Adequate Health Literacy (02/03/2023)   B1300 Health Literacy    Frequency of need for help with medical instructions: Never    Lab Results  Component Value Date   HGBA1C 7.2 (H) 04/18/2024   HGBA1C 7.2 (H) 09/21/2023   HGBA1C 8.8 (H) 06/22/2023   Lab Results  Component Value  Date   CHOL 113 04/18/2024   Lab Results  Component Value Date   HDL 39 (L) 04/18/2024   Lab Results  Component Value Date   LDLCALC 48 04/18/2024   Lab Results  Component Value Date   TRIG 150 (H) 04/18/2024   Lab Results  Component Value Date   CHOLHDL 2.9 04/18/2024   Lab Results  Component Value Date   CREATININE 2.14 (H) 04/18/2024   Lab Results  Component Value Date   GFR 37.24 (L) 06/30/2023   Lab Results  Component Value Date   MICROALBUR 4.7 09/29/2023      Component Value Date/Time   NA 140 04/18/2024 1004   K 4.6 04/18/2024 1004   CL 105 04/18/2024 1004   CO2 18 (L) 04/18/2024 1004   GLUCOSE 122 (H) 04/18/2024 1004   GLUCOSE 183 (H) 07/25/2023 0548   BUN 26 (H) 04/18/2024 1004   CREATININE 2.14 (H) 04/18/2024 1004   CALCIUM 8.9 04/18/2024 1004   PROT 7.2 04/18/2024 1004   ALBUMIN 4.3 04/18/2024 1004   AST 22 04/18/2024 1004   ALT 16 04/18/2024 1004   ALKPHOS 68 04/18/2024 1004   BILITOT 0.3 04/18/2024 1004   GFRNONAA 31 (L) 07/25/2023 0548   GFRAA 41 (L) 01/11/2018 1740      Latest Ref Rng & Units 04/18/2024   10:04 AM 09/21/2023   10:45 AM 08/13/2023    8:15 AM  BMP  Glucose 70 - 99 mg/dL 877  844  850   BUN 6 - 24 mg/dL 26  27  34   Creatinine 0.76 - 1.27 mg/dL 7.85  7.94  7.63   BUN/Creat Ratio 9 - 20 12  13  14    Sodium 134 - 144 mmol/L 140  139  139   Potassium 3.5 - 5.2 mmol/L 4.6  4.9  5.0   Chloride 96 - 106 mmol/L 105  104  102   CO2 20 - 29 mmol/L 18  20  22    Calcium 8.7 - 10.2 mg/dL 8.9  9.2  9.2        Component Value Date/Time   WBC 4.9 04/18/2024 1004   WBC 8.3 07/24/2023 0537   RBC 3.94 (L) 04/18/2024 1004   RBC 4.49 07/24/2023 0537   HGB 11.0 (L) 04/18/2024 1004   HCT 33.3 (L) 04/18/2024 1004   PLT 233 04/18/2024 1004   MCV 85 04/18/2024 1004   MCH 27.9 04/18/2024 1004   MCH 28.5 07/24/2023  0537   MCHC 33.0 04/18/2024 1004   MCHC 34.2 07/24/2023 0537   RDW 13.1 04/18/2024 1004   LYMPHSABS 1.3 04/18/2024 1004    MONOABS 0.7 07/24/2023 0009   EOSABS 0.1 04/18/2024 1004   BASOSABS 0.0 04/18/2024 1004     Parts of this note may have been dictated using voice recognition software. There may be variances in spelling and vocabulary which are unintentional. Not all errors are proofread. Please notify the dino if any discrepancies are noted or if the meaning of any statement is not clear.   "

## 2024-05-06 ENCOUNTER — Other Ambulatory Visit: Payer: Self-pay | Admitting: Physician Assistant

## 2024-05-08 ENCOUNTER — Other Ambulatory Visit: Payer: Self-pay | Admitting: Physician Assistant

## 2024-05-08 DIAGNOSIS — Z794 Long term (current) use of insulin: Secondary | ICD-10-CM

## 2024-05-08 NOTE — Telephone Encounter (Unsigned)
 Copied from CRM #[id number]. Topic: Clinical - Medication Refill >> May 08, 2024  5:05 PM Delon T wrote: Medication: Semaglutide  (RYBELSUS ) 7 MG TABS Semaglutide  (RYBELSUS ) 7 MG TABS  Has the patient contacted their pharmacy? Yes (Agent: If no, request that the patient contact the pharmacy for the refill. If patient does not wish to contact the pharmacy document the reason why and proceed with request.) (Agent: If yes, when and what did the pharmacy advise?)  This is the patient's preferred pharmacy:  [id number] - [place], [place] - [address] [facility] [place] [address] Phone: [phone] Fax: [phone]    Is this the correct pharmacy for this prescription? Yes If no, delete pharmacy and type the correct one.   Has the prescription been filled recently? Yes  Is the patient out of the medication? Yes  Has the patient been seen for an appointment in the last year OR does the patient have an upcoming appointment? Yes  Can we respond through MyChart? Yes  Agent: Please be advised that Rx refills may take up to 3 business days. We ask that you follow-up with your pharmacy.

## 2024-05-09 ENCOUNTER — Other Ambulatory Visit: Payer: Self-pay | Admitting: Physician Assistant

## 2024-05-09 DIAGNOSIS — E1165 Type 2 diabetes mellitus with hyperglycemia: Secondary | ICD-10-CM

## 2024-05-09 MED ORDER — RYBELSUS 7 MG PO TABS: 7.0000 mg | ORAL_TABLET | Freq: Two times a day (BID) | ORAL | 3 refills | Status: DC

## 2024-05-10 ENCOUNTER — Other Ambulatory Visit: Payer: Self-pay

## 2024-05-10 MED ORDER — ESOMEPRAZOLE MAGNESIUM 40 MG PO CPDR: 40.0000 mg | DELAYED_RELEASE_CAPSULE | Freq: Every day | ORAL | 1 refills | Status: DC | PRN

## 2024-05-11 ENCOUNTER — Other Ambulatory Visit: Payer: Self-pay

## 2024-05-11 MED ORDER — ESOMEPRAZOLE MAGNESIUM 40 MG PO CPDR: 40.0000 mg | DELAYED_RELEASE_CAPSULE | Freq: Every day | ORAL | 1 refills | Status: AC | PRN

## 2024-08-07 ENCOUNTER — Ambulatory Visit: Admitting: "Endocrinology

## 2024-08-17 ENCOUNTER — Ambulatory Visit: Admitting: Physician Assistant
